# Patient Record
Sex: Male | Born: 1950 | Race: White | Hispanic: No | Marital: Married | Smoking: Never smoker
Health system: Southern US, Community
[De-identification: ages and names within clinical notes are randomized; demographics above are authoritative.]

## PROBLEM LIST (undated history)

## (undated) DIAGNOSIS — I10 Essential (primary) hypertension: Secondary | ICD-10-CM

## (undated) DIAGNOSIS — M545 Low back pain, unspecified: Secondary | ICD-10-CM

## (undated) DIAGNOSIS — G4700 Insomnia, unspecified: Secondary | ICD-10-CM

## (undated) DIAGNOSIS — K5792 Diverticulitis of intestine, part unspecified, without perforation or abscess without bleeding: Secondary | ICD-10-CM

## (undated) DIAGNOSIS — G8929 Other chronic pain: Secondary | ICD-10-CM

## (undated) DIAGNOSIS — C4402 Squamous cell carcinoma of skin of lip: Secondary | ICD-10-CM

## (undated) DIAGNOSIS — L259 Unspecified contact dermatitis, unspecified cause: Secondary | ICD-10-CM

## (undated) DIAGNOSIS — M6528 Calcific tendinitis, other site: Secondary | ICD-10-CM

## (undated) DIAGNOSIS — Z86010 Personal history of colonic polyps: Secondary | ICD-10-CM

## (undated) DIAGNOSIS — F5104 Psychophysiologic insomnia: Secondary | ICD-10-CM

## (undated) DIAGNOSIS — F419 Anxiety disorder, unspecified: Secondary | ICD-10-CM

## (undated) DIAGNOSIS — K219 Gastro-esophageal reflux disease without esophagitis: Secondary | ICD-10-CM

## (undated) DIAGNOSIS — K279 Peptic ulcer, site unspecified, unspecified as acute or chronic, without hemorrhage or perforation: Secondary | ICD-10-CM

## (undated) DIAGNOSIS — E782 Mixed hyperlipidemia: Secondary | ICD-10-CM

## (undated) DIAGNOSIS — H8110 Benign paroxysmal vertigo, unspecified ear: Secondary | ICD-10-CM

## (undated) DIAGNOSIS — K130 Diseases of lips: Secondary | ICD-10-CM

## (undated) DIAGNOSIS — Z8709 Personal history of other diseases of the respiratory system: Secondary | ICD-10-CM

## (undated) DIAGNOSIS — M7662 Achilles tendinitis, left leg: Secondary | ICD-10-CM

## (undated) DIAGNOSIS — A6000 Herpesviral infection of urogenital system, unspecified: Secondary | ICD-10-CM

## (undated) DIAGNOSIS — Z8619 Personal history of other infectious and parasitic diseases: Secondary | ICD-10-CM

## (undated) DIAGNOSIS — Z1211 Encounter for screening for malignant neoplasm of colon: Secondary | ICD-10-CM

## (undated) HISTORY — DX: Diverticulitis of intestine, part unspecified, without perforation or abscess without bleeding: K57.92

## (undated) HISTORY — DX: Unspecified contact dermatitis, unspecified cause: L25.9

## (undated) HISTORY — DX: Personal history of other infectious and parasitic diseases: Z86.19

## (undated) HISTORY — PX: EYE SURGERY: SHX253

## (undated) HISTORY — DX: Benign paroxysmal vertigo, unspecified ear: H81.10

## (undated) HISTORY — DX: Calcific tendinitis, other site: M65.28

## (undated) HISTORY — DX: Low back pain: M54.5

## (undated) HISTORY — DX: Personal history of other diseases of the respiratory system: Z87.09

## (undated) HISTORY — DX: Gastro-esophageal reflux disease without esophagitis: K21.9

## (undated) HISTORY — DX: Low back pain, unspecified: M54.50

## (undated) HISTORY — DX: Squamous cell carcinoma of skin of lip: C44.02

## (undated) HISTORY — DX: Essential (primary) hypertension: I10

## (undated) HISTORY — DX: Encounter for screening for malignant neoplasm of colon: Z12.11

## (undated) HISTORY — DX: Gilbert syndrome: E80.4

## (undated) HISTORY — DX: Personal history of colonic polyps: Z86.010

## (undated) HISTORY — DX: Other chronic pain: G89.29

## (undated) HISTORY — DX: Herpesviral infection of urogenital system, unspecified: A60.00

## (undated) HISTORY — DX: Peptic ulcer, site unspecified, unspecified as acute or chronic, without hemorrhage or perforation: K27.9

## (undated) HISTORY — DX: Psychophysiologic insomnia: F51.04

## (undated) HISTORY — DX: Diseases of lips: K13.0

## (undated) HISTORY — DX: Anxiety disorder, unspecified: F41.9

## (undated) HISTORY — DX: Insomnia, unspecified: G47.00

## (undated) HISTORY — DX: Mixed hyperlipidemia: E78.2

## (undated) HISTORY — DX: Achilles tendinitis, left leg: M76.62

---

## 2003-03-25 ENCOUNTER — Emergency Department (HOSPITAL_COMMUNITY): Admission: EM | Admit: 2003-03-25 | Discharge: 2003-03-25 | Payer: Self-pay | Admitting: Emergency Medicine

## 2003-07-18 DIAGNOSIS — Z86010 Personal history of colonic polyps: Secondary | ICD-10-CM

## 2003-07-18 DIAGNOSIS — Z860101 Personal history of adenomatous and serrated colon polyps: Secondary | ICD-10-CM

## 2003-07-18 HISTORY — DX: Personal history of colonic polyps: Z86.010

## 2003-07-18 HISTORY — PX: COLONOSCOPY W/ POLYPECTOMY: SHX1380

## 2003-07-18 HISTORY — DX: Personal history of adenomatous and serrated colon polyps: Z86.0101

## 2005-01-03 ENCOUNTER — Ambulatory Visit: Payer: Self-pay | Admitting: Family Medicine

## 2005-01-09 ENCOUNTER — Ambulatory Visit: Payer: Self-pay | Admitting: Family Medicine

## 2005-01-19 ENCOUNTER — Ambulatory Visit: Payer: Self-pay | Admitting: Family Medicine

## 2005-11-21 ENCOUNTER — Encounter: Payer: Self-pay | Admitting: Family Medicine

## 2006-02-13 ENCOUNTER — Ambulatory Visit: Payer: Self-pay | Admitting: Family Medicine

## 2006-03-08 ENCOUNTER — Ambulatory Visit: Payer: Self-pay | Admitting: Family Medicine

## 2007-02-18 ENCOUNTER — Ambulatory Visit: Payer: Self-pay | Admitting: Family Medicine

## 2007-02-18 LAB — CONVERTED CEMR LAB
ALT: 23 units/L (ref 0–53)
AST: 21 units/L (ref 0–37)
Albumin: 4.1 g/dL (ref 3.5–5.2)
Alkaline Phosphatase: 63 units/L (ref 39–117)
BUN: 13 mg/dL (ref 6–23)
Basophils Absolute: 0 10*3/uL (ref 0.0–0.1)
Basophils Relative: 0.5 % (ref 0.0–1.0)
Bilirubin Urine: NEGATIVE
Bilirubin, Direct: 0.1 mg/dL (ref 0.0–0.3)
Blood in Urine, dipstick: NEGATIVE
CO2: 29 meq/L (ref 19–32)
Calcium: 8.8 mg/dL (ref 8.4–10.5)
Chloride: 108 meq/L (ref 96–112)
Cholesterol: 213 mg/dL (ref 0–200)
Creatinine, Ser: 1 mg/dL (ref 0.4–1.5)
Direct LDL: 132.1 mg/dL
Eosinophils Absolute: 0.6 10*3/uL (ref 0.0–0.6)
Eosinophils Relative: 9 % — ABNORMAL HIGH (ref 0.0–5.0)
GFR calc Af Amer: 101 mL/min
GFR calc non Af Amer: 83 mL/min
Glucose, Bld: 96 mg/dL (ref 70–99)
Glucose, Urine, Semiquant: NEGATIVE
HCT: 44.2 % (ref 39.0–52.0)
HDL: 48.9 mg/dL (ref >39.0)
Hemoglobin: 15.6 g/dL (ref 13.0–17.0)
Lymphocytes Relative: 22.6 % (ref 12.0–46.0)
MCHC: 35.2 g/dL (ref 30.0–36.0)
MCV: 98.3 fL (ref 78.0–100.0)
Monocytes Absolute: 0.5 10*3/uL (ref 0.2–0.7)
Monocytes Relative: 7.4 % (ref 3.0–11.0)
Neutro Abs: 3.9 10*3/uL (ref 1.4–7.7)
Neutrophils Relative %: 60.5 % (ref 43.0–77.0)
Nitrite: NEGATIVE
PSA: 0.34 ng/mL (ref 0.10–4.00)
Platelets: 228 10*3/uL (ref 150–400)
Potassium: 4.1 meq/L (ref 3.5–5.1)
Protein, U semiquant: NEGATIVE
RBC: 4.5 M/uL (ref 4.22–5.81)
RDW: 11.5 % (ref 11.5–14.6)
Sodium: 144 meq/L (ref 135–145)
Specific Gravity, Urine: 1.025
TSH: 2.67 microintl units/mL (ref 0.35–5.50)
Total Bilirubin: 1.4 mg/dL — ABNORMAL HIGH (ref 0.3–1.2)
Total CHOL/HDL Ratio: 4.4
Total Protein: 6.4 g/dL (ref 6.0–8.3)
Triglycerides: 180 mg/dL — ABNORMAL HIGH (ref 0–149)
Urobilinogen, UA: 0.2
VLDL: 36 mg/dL (ref 0–40)
WBC Urine, dipstick: NEGATIVE
WBC: 6.4 10*3/uL (ref 4.5–10.5)
pH: 6

## 2007-02-21 ENCOUNTER — Ambulatory Visit: Payer: Self-pay | Admitting: Family Medicine

## 2007-02-21 DIAGNOSIS — L259 Unspecified contact dermatitis, unspecified cause: Secondary | ICD-10-CM | POA: Insufficient documentation

## 2007-03-19 ENCOUNTER — Ambulatory Visit: Payer: Self-pay | Admitting: Family Medicine

## 2007-03-19 DIAGNOSIS — N390 Urinary tract infection, site not specified: Secondary | ICD-10-CM | POA: Insufficient documentation

## 2007-03-19 DIAGNOSIS — K5732 Diverticulitis of large intestine without perforation or abscess without bleeding: Secondary | ICD-10-CM | POA: Insufficient documentation

## 2007-03-19 LAB — CONVERTED CEMR LAB
Bilirubin Urine: NEGATIVE
Glucose, Urine, Semiquant: NEGATIVE
Nitrite: NEGATIVE
Protein, U semiquant: NEGATIVE
Specific Gravity, Urine: 1.02
Urobilinogen, UA: 0.2
WBC Urine, dipstick: NEGATIVE
pH: 6

## 2007-03-20 ENCOUNTER — Telehealth: Payer: Self-pay | Admitting: Family Medicine

## 2007-03-20 ENCOUNTER — Ambulatory Visit: Payer: Self-pay | Admitting: Family Medicine

## 2007-03-20 DIAGNOSIS — M109 Gout, unspecified: Secondary | ICD-10-CM | POA: Insufficient documentation

## 2007-03-20 LAB — CONVERTED CEMR LAB
Basophils Absolute: 0 10*3/uL (ref 0.0–0.1)
Basophils Relative: 0.3 % (ref 0.0–1.0)
Eosinophils Absolute: 0.4 10*3/uL (ref 0.0–0.6)
Eosinophils Relative: 3.1 % (ref 0.0–5.0)
HCT: 40.2 % (ref 39.0–52.0)
Hemoglobin: 14 g/dL (ref 13.0–17.0)
Lymphocytes Relative: 12.7 % (ref 12.0–46.0)
MCHC: 35.5 g/dL (ref 30.0–36.0)
MCV: 96.1 fL (ref 78.0–100.0)
Monocytes Absolute: 1.9 10*3/uL — ABNORMAL HIGH (ref 0.2–0.7)
Monocytes Relative: 15.2 % — ABNORMAL HIGH (ref 3.0–11.0)
Neutro Abs: 8.5 10*3/uL — ABNORMAL HIGH (ref 1.4–7.7)
Neutrophils Relative %: 68.7 % (ref 43.0–77.0)
Platelets: 248 10*3/uL (ref 150–400)
RBC: 4.18 M/uL — ABNORMAL LOW (ref 4.22–5.81)
RDW: 11.3 % — ABNORMAL LOW (ref 11.5–14.6)
WBC: 12.4 10*3/uL — ABNORMAL HIGH (ref 4.5–10.5)

## 2007-04-11 ENCOUNTER — Telehealth: Payer: Self-pay | Admitting: Family Medicine

## 2007-06-10 ENCOUNTER — Encounter: Payer: Self-pay | Admitting: Family Medicine

## 2007-11-13 ENCOUNTER — Telehealth: Payer: Self-pay | Admitting: Family Medicine

## 2008-02-20 ENCOUNTER — Ambulatory Visit: Payer: Self-pay | Admitting: Family Medicine

## 2008-02-20 DIAGNOSIS — F528 Other sexual dysfunction not due to a substance or known physiological condition: Secondary | ICD-10-CM | POA: Insufficient documentation

## 2008-02-20 DIAGNOSIS — G4700 Insomnia, unspecified: Secondary | ICD-10-CM | POA: Insufficient documentation

## 2008-02-20 DIAGNOSIS — K219 Gastro-esophageal reflux disease without esophagitis: Secondary | ICD-10-CM | POA: Insufficient documentation

## 2008-02-20 DIAGNOSIS — S335XXA Sprain of ligaments of lumbar spine, initial encounter: Secondary | ICD-10-CM

## 2008-02-20 DIAGNOSIS — S339XXA Sprain of unspecified parts of lumbar spine and pelvis, initial encounter: Secondary | ICD-10-CM | POA: Insufficient documentation

## 2008-02-21 LAB — CONVERTED CEMR LAB
Basophils Absolute: 0 10*3/uL (ref 0.0–0.1)
Basophils Relative: 0.7 % (ref 0.0–3.0)
Cholesterol: 226 mg/dL (ref 0–200)
Direct LDL: 169.8 mg/dL
Eosinophils Absolute: 0.5 10*3/uL (ref 0.0–0.7)
Eosinophils Relative: 8.7 % — ABNORMAL HIGH (ref 0.0–5.0)
HCT: 45.9 % (ref 39.0–52.0)
HDL: 43.3 mg/dL (ref >39.0)
Hemoglobin: 16 g/dL (ref 13.0–17.0)
Lymphocytes Relative: 23.2 % (ref 12.0–46.0)
MCHC: 34.7 g/dL (ref 30.0–36.0)
MCV: 96.4 fL (ref 78.0–100.0)
Monocytes Absolute: 0.6 10*3/uL (ref 0.1–1.0)
Monocytes Relative: 9.5 % (ref 3.0–12.0)
Neutro Abs: 3.7 10*3/uL (ref 1.4–7.7)
Neutrophils Relative %: 57.9 % (ref 43.0–77.0)
PSA: 0.34 ng/mL (ref 0.10–4.00)
Platelets: 239 10*3/uL (ref 150–400)
RBC: 4.76 M/uL (ref 4.22–5.81)
RDW: 12.1 % (ref 11.5–14.6)
Total CHOL/HDL Ratio: 5.2
Triglycerides: 113 mg/dL (ref 0–149)
VLDL: 23 mg/dL (ref 0–40)
WBC: 6.2 10*3/uL (ref 4.5–10.5)

## 2008-04-29 ENCOUNTER — Telehealth: Payer: Self-pay | Admitting: Family Medicine

## 2008-12-30 ENCOUNTER — Ambulatory Visit: Payer: Self-pay | Admitting: Family Medicine

## 2008-12-30 DIAGNOSIS — L0591 Pilonidal cyst without abscess: Secondary | ICD-10-CM | POA: Insufficient documentation

## 2008-12-30 DIAGNOSIS — J309 Allergic rhinitis, unspecified: Secondary | ICD-10-CM | POA: Insufficient documentation

## 2008-12-30 DIAGNOSIS — M549 Dorsalgia, unspecified: Secondary | ICD-10-CM | POA: Insufficient documentation

## 2009-02-04 ENCOUNTER — Telehealth: Payer: Self-pay | Admitting: Family Medicine

## 2009-03-03 ENCOUNTER — Ambulatory Visit: Payer: Self-pay | Admitting: Family Medicine

## 2009-03-03 LAB — CONVERTED CEMR LAB
Bilirubin Urine: NEGATIVE
Blood in Urine, dipstick: NEGATIVE
Glucose, Urine, Semiquant: NEGATIVE
Ketones, urine, test strip: NEGATIVE
Nitrite: NEGATIVE
Protein, U semiquant: NEGATIVE
Specific Gravity, Urine: 1.02
Urobilinogen, UA: 0.2
WBC Urine, dipstick: NEGATIVE
pH: 6

## 2009-03-10 ENCOUNTER — Ambulatory Visit: Payer: Self-pay | Admitting: Family Medicine

## 2009-03-10 DIAGNOSIS — M5126 Other intervertebral disc displacement, lumbar region: Secondary | ICD-10-CM | POA: Insufficient documentation

## 2009-03-10 DIAGNOSIS — B370 Candidal stomatitis: Secondary | ICD-10-CM | POA: Insufficient documentation

## 2009-03-11 ENCOUNTER — Telehealth: Payer: Self-pay | Admitting: Family Medicine

## 2009-03-16 LAB — CONVERTED CEMR LAB
ALT: 19 units/L (ref 0–53)
AST: 25 units/L (ref 0–37)
Albumin: 4.4 g/dL (ref 3.5–5.2)
Alkaline Phosphatase: 72 units/L (ref 39–117)
BUN: 15 mg/dL (ref 6–23)
Basophils Absolute: 0 10*3/uL (ref 0.0–0.1)
Basophils Relative: 0 % (ref 0.0–3.0)
Bilirubin, Direct: 0.2 mg/dL (ref 0.0–0.3)
CO2: 27 meq/L (ref 19–32)
Calcium: 9.2 mg/dL (ref 8.4–10.5)
Chloride: 109 meq/L (ref 96–112)
Cholesterol: 232 mg/dL — ABNORMAL HIGH (ref 0–200)
Creatinine, Ser: 1 mg/dL (ref 0.4–1.5)
Direct LDL: 146.4 mg/dL
Eosinophils Absolute: 0.3 10*3/uL (ref 0.0–0.7)
Eosinophils Relative: 4.4 % (ref 0.0–5.0)
GFR calc non Af Amer: 82.16 mL/min (ref >60)
Glucose, Bld: 96 mg/dL (ref 70–99)
HCT: 43.8 % (ref 39.0–52.0)
HDL: 44.5 mg/dL (ref >39.00)
Hemoglobin: 15.5 g/dL (ref 13.0–17.0)
Lymphocytes Relative: 18.7 % (ref 12.0–46.0)
Lymphs Abs: 1.3 10*3/uL (ref 0.7–4.0)
MCHC: 35.4 g/dL (ref 30.0–36.0)
MCV: 98.1 fL (ref 78.0–100.0)
Monocytes Absolute: 0.6 10*3/uL (ref 0.1–1.0)
Monocytes Relative: 9.2 % (ref 3.0–12.0)
Neutro Abs: 4.5 10*3/uL (ref 1.4–7.7)
Neutrophils Relative %: 67.7 % (ref 43.0–77.0)
PSA: 0.33 ng/mL (ref 0.10–4.00)
Platelets: 212 10*3/uL (ref 150.0–400.0)
Potassium: 4.2 meq/L (ref 3.5–5.1)
RBC: 4.46 M/uL (ref 4.22–5.81)
RDW: 11.9 % (ref 11.5–14.6)
Sodium: 143 meq/L (ref 135–145)
TSH: 3.59 microintl units/mL (ref 0.35–5.50)
Total Bilirubin: 1.7 mg/dL — ABNORMAL HIGH (ref 0.3–1.2)
Total CHOL/HDL Ratio: 5
Total Protein: 6.9 g/dL (ref 6.0–8.3)
Triglycerides: 298 mg/dL — ABNORMAL HIGH (ref 0.0–149.0)
VLDL: 59.6 mg/dL — ABNORMAL HIGH (ref 0.0–40.0)
WBC: 6.7 10*3/uL (ref 4.5–10.5)

## 2009-04-23 ENCOUNTER — Telehealth: Payer: Self-pay | Admitting: Family Medicine

## 2009-04-26 ENCOUNTER — Telehealth: Payer: Self-pay | Admitting: Family Medicine

## 2010-02-02 ENCOUNTER — Telehealth: Payer: Self-pay | Admitting: Family Medicine

## 2010-02-23 ENCOUNTER — Ambulatory Visit: Payer: Self-pay | Admitting: Family Medicine

## 2010-04-07 ENCOUNTER — Telehealth (INDEPENDENT_AMBULATORY_CARE_PROVIDER_SITE_OTHER): Payer: Self-pay | Admitting: *Deleted

## 2010-08-02 ENCOUNTER — Telehealth: Payer: Self-pay | Admitting: Family Medicine

## 2010-08-08 ENCOUNTER — Encounter: Payer: Self-pay | Admitting: Family Medicine

## 2010-08-16 NOTE — Progress Notes (Signed)
Summary: needs results  Phone Note Call from Patient Call back at Home Phone [phone]   Caller: Patient Call For: dr Scotty Court Summary of Call: pt is returning gina call for results Initial call taken by: Heron Sabins,  March 20, 2007 11:06 AM  Follow-up for Phone Call        called by Dr Scotty Court   Follow-up by: Pura Spice, RN,  March 20, 2007 1:52 PM

## 2010-08-16 NOTE — Assessment & Plan Note (Signed)
Summary: raw spot on lip/LC   Vital Signs:  Patient profile:   60 year old male Temp:     98.4 degrees F oral BP sitting:   120 / 84  (left arm) Cuff size:   regular  Vitals Entered By: Sid Falcon LPN (March 03, 2009 9:28 AM)  History of Present Illness: Patient was here getting lab work for physical next week and was asked to be seen as a work in with one-week history of irritation confined to the lower lip. Particularly of the past couple days increased soreness and whitish plaque covering the lip. He was on antibiotics in June but not since then. No recent prednisone use. No immune problems. No history of diabetes. No steroid inhaler use.  denies sore throat no difficulty swallowing.  No recent change of medications.  Allergies (verified): No Known Drug Allergies  Review of Systems  The patient denies anorexia and fever.    Physical Exam  General:  Well-developed,well-nourished,in no acute distress; alert,appropriate and cooperative throughout examination Eyes:  pupils round.   Mouth:  patient has erythema involving the lower lip and some whitish plaque which is covering some areas. This is fairly tenacious but does scrape off with pressure. Gingival mucosa appears normal. Buccal mucosa and posterior pharynx are normal in appearance. Upper lip is normal to exam. No evidence for a cold sores involving lower lip. Neck:  no adenopathy noted   Impression & Recommendations:  Problem # 1:  SKIN RASH (ICD-782.1) This really looks clinically more like thrush though he has no clear risk factors. Will treat with antifungal topically and he has scheduled followup with primary physician next week and labs were drawn today. No diabetes symptoms. His updated medication list for this problem includes:    Triamcinolone Acetonide 0.1 % Crea (Triamcinolone acetonide) .Marland Kitchen... As needed  Complete Medication List: 1)  Alprazolam 0.5 Mg Tabs (Alprazolam) .Marland Kitchen.. 1 tablet morn midafternoon and 2 has  for stress 2)  Protonix 40 Mg Pack (Pantoprazole sodium) .... Take 1 tablet by mouth once a day 3)  Triamcinolone Acetonide 0.1 % Crea (Triamcinolone acetonide) .... As needed 4)  Ibuprofen 800 Mg Tabs (Ibuprofen) .... Three times a day 5)  Adult Aspirin Low Strength 81 Mg Tbdp (Aspirin) .... Once daily 6)  Temazepam 30 Mg Caps (Temazepam) .Marland Kitchen.. 1 hs as needed sleep 7)  Skelaxin 800 Mg Tabs (Metaxalone) .Marland Kitchen.. 1 qid for spasm 8)  Doxycycline Monohydrate 100 Mg Caps (Doxycycline monohydrate) .Marland Kitchen.. 1 two times a day for infection 9)  Omnaris 50 Mcg/act Susp (Ciclesonide) .... 2 sprays each nostril 10)  Nystatin 100000 Unit/ml Susp (Nystatin) .... 5 ml to each side of mouth qid as instructed 11)  Indomethacin 50 Mg Caps (Indomethacin) .Marland Kitchen.. 1 three times a day pc for gout 12)  Diflucan 200 Mg Tabs (Fluconazole) .Marland Kitchen.. 1 once daily until finished for yeast  Other Orders: UA Dipstick w/o Micro (automated)  (81003) Venipuncture (25366) TLB-Lipid Panel (80061-LIPID) TLB-BMP (Basic Metabolic Panel-BMET) (80048-METABOL) TLB-CBC Platelet - w/Differential (85025-CBCD) TLB-Hepatic/Liver Function Pnl (80076-HEPATIC) TLB-TSH (Thyroid Stimulating Hormone) (84443-TSH) TLB-PSA (Prostate Specific Antigen) (84153-PSA)  Patient Instructions: 1)  Followup promptly if you have any difficulty swallowing such as severe sore throat or worsening rash over the next couple days. Prescriptions: SKELAXIN 800 MG  TABS (METAXALONE) 1 qid for spasm  #120 x 11   Entered by:   Judithann Sheen MD   Authorized by:   Evelena Peat MD   Signed by:   Ivar Drape  Danice Goltz MD on 03/11/2009   Method used:   Electronically to        [facility] [phone]* (retail)       [address]       [place], [place]  [address]       Ph: [phone]       Fax: [phone]   RxID:   [id number] IBUPROFEN 800 MG  TABS (IBUPROFEN) three times a day  #90 x 11   Entered by:   Judithann Sheen MD   Authorized by:   Evelena Peat MD   Signed by:   Judithann Sheen MD on 03/11/2009   Method used:   Electronically to        [facility] [phone]* (retail)       [address]       [place], [place]  [address]       Ph: [phone]       Fax: [phone]   RxID:   [id number] PROTONIX 40 MG  PACK (PANTOPRAZOLE SODIUM) Take 1 tablet by mouth once a day  #30 x 11   Entered by:   Judithann Sheen MD   Authorized by:   Evelena Peat MD   Signed by:   Judithann Sheen MD on 03/11/2009   Method used:   Electronically to        [facility] [phone]* (retail)       [address]       [place], [place]  [address]       Ph: [phone]       Fax: [phone]   RxID:   [id number] DIFLUCAN 200 MG TABS (FLUCONAZOLE) 1 once daily until finished for yeast  #14 x 1   Entered by:   Judithann Sheen MD   Authorized by:   Evelena Peat MD   Signed by:   Judithann Sheen MD on 03/11/2009   Method used:   Electronically to        [facility] [phone]* (retail)       [address]       [place], [place]  [address]       Ph: [phone]       Fax: [phone]   RxID:   [id number] INDOMETHACIN 50 MG CAPS (INDOMETHACIN) 1 three times a day pc for gout  #90 x 11   Entered by:   Judithann Sheen MD   Authorized by:   Evelena Peat MD   Signed by:   Judithann Sheen MD on 03/11/2009   Method used:   Electronically to        [facility] [phone]* (retail)       [address]       [place], [place]  [address]       Ph: [phone]       Fax: [phone]   RxID:   [id number] NYSTATIN 100000 UNIT/ML SUSP (NYSTATIN) 5 ml to each side of mouth qid as instructed  #16 0z x 1   Entered and Authorized by:   Evelena Peat MD   Signed by:   Evelena Peat MD on 03/03/2009   Method used:   Electronically to        [facility] [phone]* (retail)       [address]       [place],  [place]  [address]       Ph: [phone]       Fax: [phone]   RxID:   [id number]    Laboratory Results   Urine Tests    Routine Urinalysis   Color: yellow Appearance: Clear Glucose: negative   (Normal Range: Negative) Bilirubin: negative   (Normal Range: Negative) Ketone: negative   (Normal Range: Negative) Spec. Gravity: 1.020   (Normal Range: 1.003-1.035) Blood: negative   (Normal Range: Negative) pH: 6.0   (Normal Range: 5.0-8.0) Protein: negative   (Normal Range: Negative) Urobilinogen: 0.2   (Normal Range: 0-1) Nitrite: negative   (Normal Range: Negative) Leukocyte Esterace: negative   (Normal Range: Negative)    Comments: Joanne Chars CMA  March 04, 2009 11:20 AM

## 2010-08-16 NOTE — Assessment & Plan Note (Signed)
Summary: FU ON MEDS/PT WILL COME IN FASTING/NJR   Vital Signs:  Patient Profile:   60 Years Old Male Weight:      178 pounds O2 Sat:      97 % Temp:     98.1 degrees F Pulse rate:   82 / minute BP sitting:   130 / 92  (left arm)  Vitals Entered By: Pura Spice, RN (February 20, 2008 8:48 AM)                 Chief Complaint:  rov med refill . Last CPX 02/2007. Taking Prednisone 40 mg decreasing due to back .  History of Present Illness: t in for evaluation of problems Recently had acute lumbosacral strain with marked muscle spasm Continues to be under considerable stress due to economy and family Insomnia, to try tremazepam instead of ambien other wise feels good    Current Allergies (reviewed today): No known allergies      Review of Systems      See HPI  General      Denies chills, fatigue, fever, loss of appetite, malaise, sleep disorder, sweats, weakness, and weight loss.  ENT      Denies decreased hearing, difficulty swallowing, ear discharge, earache, hoarseness, nasal congestion, nosebleeds, postnasal drainage, ringing in ears, sinus pressure, and sore throat.  CV      Denies bluish discoloration of lips or nails, chest pain or discomfort, difficulty breathing at night, difficulty breathing while lying down, fainting, fatigue, leg cramps with exertion, lightheadness, near fainting, palpitations, shortness of breath with exertion, swelling of feet, swelling of hands, and weight gain.  Resp      Denies chest discomfort, chest pain with inspiration, cough, coughing up blood, excessive snoring, hypersomnolence, morning headaches, pleuritic, shortness of breath, sputum productive, and wheezing.  GI      Denies abdominal pain, bloody stools, change in bowel habits, constipation, dark tarry stools, diarrhea, excessive appetite, gas, hemorrhoids, indigestion, loss of appetite, nausea, vomiting, vomiting blood, and yellowish skin color.      no recent episode of  diverticulitis  GU      Complains of erectile dysfunction.      viagra  MS      See HPI      gerd controlled with protonix   Physical Exam  General:     Well-developed,well-nourished,in no acute distress; alert,appropriate and cooperative throughout examination Head:     Normocephalic and atraumatic without obvious abnormalities. No apparent alopecia or balding. Eyes:     No corneal or conjunctival inflammation noted. EOMI. Perrla. Funduscopic exam benign, without hemorrhages, exudates or papilledema. Vision grossly normal. Ears:     External ear exam shows no significant lesions or deformities.  Otoscopic examination reveals clear canals, tympanic membranes are intact bilaterally without bulging, retraction, inflammation or discharge. Hearing is grossly normal bilaterally. Nose:     External nasal examination shows no deformity or inflammation. Nasal mucosa are pink and moist without lesions or exudates. Mouth:     Oral mucosa and oropharynx without lesions or exudates.  Teeth in good repair. Neck:     No deformities, masses, or tenderness noted. Chest Wall:     No deformities, masses, tenderness or gynecomastia noted. Breasts:     No masses or gynecomastia noted Lungs:     Normal respiratory effort, chest expands symmetrically. Lungs are clear to auscultation, no crackles or wheezes. Heart:     Normal rate and regular rhythm. S1 and S2 normal without  gallop, murmur, click, rub or other extra sounds. Abdomen:     Bowel sounds positive,abdomen soft and non-tender without masses, organomegaly or hernias noted. Rectal:     No external abnormalities noted. Normal sphincter tone. No rectal masses or tenderness. Genitalia:     Testes bilaterally descended without nodularity, tenderness or masses. No scrotal masses or lesions. No penis lesions or urethral discharge. Prostate:     Prostate gland firm and smooth, no enlargement, nodularity, tenderness, mass, asymmetry or induration.  Msk:     marked muscle spasm lumbar, no limitation straight leg raising Pulses:     R and L carotid,radial,femoral,dorsalis pedis and posterior tibial pulses are full and equal bilaterally Extremities:     No clubbing, cyanosis, edema, or deformity noted with normal full range of motion of all joints.   Neurologic:     No cranial nerve deficits noted. Station and gait are normal. Plantar reflexes are down-going bilaterally. DTRs are symmetrical throughout. Sensory, motor and coordinative functions appear intact. Skin:     Intact without suspicious lesions or rashes Cervical Nodes:     No lymphadenopathy noted Axillary Nodes:     No palpable lymphadenopathy Inguinal Nodes:     No significant adenopathy Psych:     Cognition and judgment appear intact. Alert and cooperative with normal attention span and concentration. No apparent delusions, illusions, hallucinations    Impression & Recommendations:  Problem # 1:  LUMBOSACRAL STRAIN (ICD-846.0) Assessment: New  Problem # 2:  GOUT NOS (ICD-274.9) Assessment: Improved  His updated medication list for this problem includes:    Ibuprofen 800 Mg Tabs (Ibuprofen) .Marland Kitchen... Three times a day   Problem # 3:  DIVERTICULITIS, COLON W/O HEM (ICD-562.11) Assessment: Improved  Problem # 4:  HEALTH MAINTENANCE EXAM (ICD-V70.0) Assessment: Unchanged  Orders: Venipuncture (16109) TLB-CBC Platelet - w/Differential (85025-CBCD) TLB-Lipid Panel (80061-LIPID) TLB-PSA (Prostate Specific Antigen) (84153-PSA)   Problem # 5:  GERD (ICD-530.81) Assessment: Improved  His updated medication list for this problem includes:    Protonix 40 Mg Pack (Pantoprazole sodium) .Marland Kitchen... Take 1 tablet by mouth once a day   Complete Medication List: 1)  Alprazolam 0.5 Mg Tabs (Alprazolam) .Marland Kitchen.. 1 tablet morn midafternoon and 2 has for stress 2)  Protonix 40 Mg Pack (Pantoprazole sodium) .... Take 1 tablet by mouth once a day 3)  Triamcinolone Acetonide 0.1 %  Crea (Triamcinolone acetonide) .... As needed 4)  Ibuprofen 800 Mg Tabs (Ibuprofen) .... Three times a day 5)  Adult Aspirin Low Strength 81 Mg Tbdp (Aspirin) .... Once daily 6)  Metronidazole 500 Mg Tabs (Metronidazole) .... Three times a day 7)  Ciprofloxacin Hcl 500 Mg Tabs (Ciprofloxacin hcl) .... Take one tablet twice a day 8)  Cyclobenzaprine Hcl 10 Mg Tabs (Cyclobenzaprine hcl) .... Three times a day 9)  Temazepam 30 Mg Caps (Temazepam) .Marland Kitchen.. 1 hs as needed sleep 10)  Skelaxin 800 Mg Tabs (Metaxalone) .Marland Kitchen.. 1 qid for spasm 11)  Viagra 100 Mg Tabs (Sildenafil citrate)   Patient Instructions: 1)  to increase skelaxin to 1 4 ntimes per day 2)  try temazepam instead of ambien 3)  continue other medications   Prescriptions: TEMAZEPAM 30 MG  CAPS (TEMAZEPAM) 1 hs as needed sleep  #30 x 5   Entered and Authorized by:   Judithann Sheen MD   Signed by:   Judithann Sheen MD on 02/20/2008   Method used:   Electronically sent to ...       [facility]  [id number] [phone]*       [address]       [place], [place]         Ph: [phone]       Fax: [phone]   RxID:   [id number] CYCLOBENZAPRINE HCL 10 MG  TABS (CYCLOBENZAPRINE HCL) three times a day  #90 x 11   Entered and Authorized by:   Judithann Sheen MD   Signed by:   Judithann Sheen MD on 02/20/2008   Method used:   Electronically sent to ...       [facility]  [address] [phone]*       [address]       [place], [place]         Ph: [phone]       Fax: [phone]   RxID:   [id number] IBUPROFEN 800 MG  TABS (IBUPROFEN) three times a day  #90 x 11   Entered and Authorized by:   Judithann Sheen MD   Signed by:   Judithann Sheen MD on 02/20/2008   Method used:   Electronically sent to ...       [facility]  [address] [phone]*       [address]       [place], [place]         Ph: [phone]       Fax: [phone]   RxID:   [id number] PROTONIX 40 MG  PACK (PANTOPRAZOLE SODIUM)  Take 1 tablet by mouth once a day  #90 x 11   Entered and Authorized by:   Judithann Sheen MD   Signed by:   Judithann Sheen MD on 02/20/2008   Method used:   Electronically sent to ...       [facility]  [address] [phone]*       [address]       [place], [place]         Ph: [phone]       Fax: [phone]   RxID:   [id number] ALPRAZOLAM 0.5 MG TABS (ALPRAZOLAM) 1 tablet morn midafternoon and 2 has for stress  #120 x 5   Entered and Authorized by:   Judithann Sheen MD   Signed by:   Judithann Sheen MD on 02/20/2008   Method used:   Print then Give to Patient   RxID:   [id number]  ]

## 2010-08-16 NOTE — Progress Notes (Signed)
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  Follow-up by: Pura Spice, RN,  February 04, 2009 5:40 PM

## 2010-08-16 NOTE — Progress Notes (Signed)
Summary: Omnaris prior authorization  Phone Note Outgoing Call   Summary of Call: Left a message for pt to return my call. We are receiving a prior authorization for Omnaris? MD needs to know if patient has allergies? Or why is pt needing this med? Initial call taken by: Josph Macho RMA,  April 07, 2010 10:45 AM     Appended Document: Omnaris prior authorization Spoke with pt he states he tried Nasacort a few years ago and has no allergies.  Appended Document: Omnaris prior authorization    Clinical Lists Changes  Medications: Added new medication of FLUTICASONE PROPIONATE 50 MCG/ACT SUSP (FLUTICASONE PROPIONATE) 2 sprays each nostril daily as needed allergies - Signed Rx of FLUTICASONE PROPIONATE 50 MCG/ACT SUSP (FLUTICASONE PROPIONATE) 2 sprays each nostril daily as needed allergies;  #1 bottle x 1;  Signed;  Entered by: Danise Edge MD;  Authorized by: Danise Edge MD;  Method used: Electronically to [facility] [phone]*, [address] [place], [place]  [address], Ph: [phone], Fax: [phone]    Prescriptions: FLUTICASONE PROPIONATE 50 MCG/ACT SUSP (FLUTICASONE PROPIONATE) 2 sprays each nostril daily as needed allergies  #1 bottle x 1   Entered and Authorized by:   Danise Edge MD   Signed by:   Danise Edge MD on 04/11/2010   Method used:   Electronically to        [facility] [phone]* (retail)       [address]       [place], [place]  [address]       Ph: [phone]       Fax: [phone]   RxID:   [id number]

## 2010-08-16 NOTE — Progress Notes (Signed)
Summary: fungus   Phone Note Call from Patient Call back at [phone]   Summary of Call: Fungus inside lip again.  Taken 14 day med diflucan, still red & blistery, burns bad.  Have another refill.  What to do? Initial call taken by: Rudy Jew, RN,  February 02, 2010 1:47 PM  Follow-up for Phone Call        called pt, torefill Follow-up by: Judithann Sheen MD,  February 07, 2010 6:47 PM

## 2010-08-16 NOTE — Assessment & Plan Note (Signed)
Summary: med check--//ccm   Vital Signs:  Patient profile:   60 year old male Weight:      176 pounds BMI:     24.64 O2 Sat:      95 % Temp:     98.2 degrees F Pulse rate:   89 / minute BP sitting:   120 / 80  (left arm)  Vitals Entered By: Pura Spice, RN (February 23, 2010 9:17 AM) CC: ck inside lip needs refills and wants name of neurologist.    History of Present Illness: History to 71-year-old white married male L1 has had a problem with milia or fungal infection a stomatitis and recent reoccurring on his lip and inner aspect of his lips he did respond to Diflucan but now has had a real current peer he is 90% better but not completely well Al is under good control withi Indocin when it occurs he has a history of chronic lumbosacral pain and has to be very careful as to his activity is as far as abnormal movement and lifting times he is totally inked past stated in the ibuprofen as well as Skelaxin Continuous to have a problem with chronic insomnia and needs temazepam GERD is under control with proton it but he needs to take it continuously Will refill his medication  Allergies (verified): No Known Drug Allergies  Past History:  Social History: Last updated: 02/20/2007 Never Smoked Married Alcohol use-yes Drug use-no Regular exercise-yes  Risk Factors: Smoking Status: never (02/20/2007)  Family History: Family History of Cardiovascular disorder Father had MI at age 81  Review of Systems      See HPI  The patient denies anorexia, fever, weight loss, weight gain, vision loss, decreased hearing, hoarseness, chest pain, syncope, dyspnea on exertion, peripheral edema, prolonged cough, headaches, hemoptysis, abdominal pain, melena, hematochezia, severe indigestion/heartburn, hematuria, incontinence, genital sores, muscle weakness, suspicious skin lesions, transient blindness, difficulty walking, depression, unusual weight change, abnormal bleeding, enlarged lymph nodes,  angioedema, breast masses, and testicular masses.    Physical Exam  General:  Well-developed,well-nourished,in no acute distress; alert,appropriate and cooperative throughout examination Head:  Normocephalic and atraumatic without obvious abnormalities. No apparent alopecia or balding. Eyes:  No corneal or conjunctival inflammation noted. EOMI. Perrla. Funduscopic exam benign, without hemorrhages, exudates or papilledema. Vision grossly normal. Ears:  External ear exam shows no significant lesions or deformities.  Otoscopic examination reveals clear canals, tympanic membranes are intact bilaterally without bulging, retraction, inflammation or discharge. Hearing is grossly normal bilaterally. Nose:  External nasal examination shows no deformity or inflammation. Nasal mucosa are pink and moist without lesions or exudates. Mouth:  monilial lesions of lip white sacaly Lungs:  Normal respiratory effort, chest expands symmetrically. Lungs are clear to auscultation, no crackles or wheezes. Heart:  Normal rate and regular rhythm. S1 and S2 normal without gallop, murmur, click, rub or other extra sounds. Abdomen:  Bowel sounds positive,abdomen soft and non-tender without masses, organomegaly or hernias noted. Msk:  No deformity or scoliosis noted of thoracic or lumbar spine.  no pain no movement or pressure at this time Pulses:  R and L carotid,radial,femoral,dorsalis pedis and posterior tibial pulses are full and equal bilaterally Extremities:  No clubbing, cyanosis, edema, or deformity noted with normal full range of motion of all joints.     Impression & Recommendations:  Problem # 1:  CANDIDIASIS OF MOUTH (ICD-112.0) Assessment Deteriorated Diflucan q.d.  Problem # 2:  BACK PAIN, LUMBOSACRAL, CHRONIC (ICD-724.5) Assessment: Unchanged  His updated medication list  for this problem includes:    Ibuprofen 800 Mg Tabs (Ibuprofen) .Marland Kitchen... Three times a day    Adult Aspirin Low Strength 81 Mg Tbdp  (Aspirin) ..... Once daily    Skelaxin 800 Mg Tabs (Metaxalone) .Marland Kitchen... 1 qid for spasm    Indomethacin 50 Mg Caps (Indomethacin) .Marland Kitchen... 1 three times a day pc for gout  Problem # 3:  INSOMNIA, CHRONIC (ICD-307.42) Assessment: Unchanged temazepam 30 mg h.s.  Problem # 4:  GOUT NOS (ICD-274.9) Assessment: Improved  His updated medication list for this problem includes:    Ibuprofen 800 Mg Tabs (Ibuprofen) .Marland Kitchen... Three times a day    Indomethacin 50 Mg Caps (Indomethacin) .Marland Kitchen... 1 three times a day pc for gout  Complete Medication List: 1)  Alprazolam 0.5 Mg Tabs (Alprazolam) .Marland Kitchen.. 1 tablet morn midafternoon and 2 has for stress 2)  Protonix 40 Mg Pack (Pantoprazole sodium) .... Take 1 tablet by mouth once a day 3)  Triamcinolone Acetonide 0.1 % Crea (Triamcinolone acetonide) .... As needed 4)  Ibuprofen 800 Mg Tabs (Ibuprofen) .... Three times a day 5)  Adult Aspirin Low Strength 81 Mg Tbdp (Aspirin) .... Once daily 6)  Temazepam 30 Mg Caps (Temazepam) .Marland Kitchen.. 1 hs as needed sleep needs ov prior to refills 7)  Skelaxin 800 Mg Tabs (Metaxalone) .Marland Kitchen.. 1 qid for spasm 8)  Omnaris 50 Mcg/act Susp (Ciclesonide) .... 2 sprays each nostril 9)  Nystatin 100000 Unit/ml Susp (Nystatin) .... 5 ml to each side of mouth qid as instructed 10)  Indomethacin 50 Mg Caps (Indomethacin) .Marland Kitchen.. 1 three times a day pc for gout 11)  Diflucan 200 Mg Tabs (Fluconazole) .Marland Kitchen.. 1 once daily until finished for yeast  Patient Instructions: 1)  continue medications as prescribed 2)  A Cardiolite or regarding a topical medication for your will 3)  Refill medication Prescriptions: PROTONIX 40 MG  PACK (PANTOPRAZOLE SODIUM) Take 1 tablet by mouth once a day  #30 x 11   Entered and Authorized by:   Judithann Sheen MD   Signed by:   Judithann Sheen MD on 03/05/2010   Method used:   Electronically to        [facility] [phone]* (retail)       [address]       [place], [place]  [address]       Ph: [phone]        Fax: [phone]   RxID:   [id number] PROTONIX 40 MG  PACK (PANTOPRAZOLE SODIUM) Take 1 tablet by mouth once a day  #30 x 11   Entered by:   Pura Spice, RN   Authorized by:   Judithann Sheen MD   Signed by:   Pura Spice, RN on 03/01/2010   Method used:   Electronically to        [facility] [phone]* (retail)       [address]       [place], [place]  [address]       Ph: [phone]       Fax: [phone]   RxID:   [id number] OMNARIS 50 MCG/ACT SUSP (CICLESONIDE) 2 sprays each nostril  #1 x 11   Entered and Authorized by:   Judithann Sheen MD   Signed by:   Judithann Sheen MD on 02/23/2010   Method used:   Electronically to        [facility] #  [id number]* (retail)       [address]       [place], [place]  [address]       Ph: [phone]       Fax: [phone]   RxID:   [id number] DIFLUCAN 200 MG TABS (FLUCONAZOLE) 1 once daily until finished for yeast  #30 x 11   Entered and Authorized by:   Judithann Sheen MD   Signed by:   Judithann Sheen MD on 02/23/2010   Method used:   Electronically to        [facility] [phone]* (retail)       [address]       [place], [place]  [address]       Ph: [phone]       Fax: [phone]   RxID:   [id number] INDOMETHACIN 50 MG CAPS (INDOMETHACIN) 1 three times a day pc for gout  #90 x 11   Entered and Authorized by:   Judithann Sheen MD   Signed by:   Judithann Sheen MD on 02/23/2010   Method used:   Electronically to        [facility] [phone]* (retail)       [address]       [place], [place]  [address]       Ph: [phone]       Fax: [phone]   RxID:   [id number] SKELAXIN 800 MG  TABS (METAXALONE) 1 qid for spasm  #120 x 11   Entered and Authorized by:   Judithann Sheen MD   Signed by:   Judithann Sheen MD on 02/23/2010   Method used:   Electronically to        [facility] [phone]* (retail)       [address]       [place],  [place]  [address]       Ph: [phone]       Fax: [phone]   RxID:   [id number] IBUPROFEN 800 MG  TABS (IBUPROFEN) three times a day  #90 x 11   Entered and Authorized by:   Judithann Sheen MD   Signed by:   Judithann Sheen MD on 02/23/2010   Method used:   Electronically to        [facility] [phone]* (retail)       [address]       [place], [place]  [address]       Ph: [phone]       Fax: [phone]   RxID:   [id number] TEMAZEPAM 30 MG  CAPS (TEMAZEPAM) 1 hs as needed sleep needs ov prior to refills  #30 x 5   Entered and Authorized by:   Judithann Sheen MD   Signed by:   Judithann Sheen MD on 02/23/2010   Method used:   Print then Give to Patient   RxID:   [id number] ALPRAZOLAM 0.5 MG TABS (ALPRAZOLAM) 1 tablet morn midafternoon and 2 has for stress  #120 x 5   Entered and Authorized by:   Judithann Sheen MD   Signed by:   Judithann Sheen MD on 02/23/2010   Method used:   Print then Give to Patient   RxID:   [id number]

## 2010-08-16 NOTE — Assessment & Plan Note (Signed)
Summary: cyst on coccyx/dm [facility] WITH PT/MHF   Vital Signs:  Patient profile:   60 year old male Height:      71 inches Weight:      182 pounds BMI:     25.48 O2 Sat:      97 % on Room air Temp:     98.2 degrees F Pulse rate:   82 / minute BP sitting:   130 / 92  (left arm)  Vitals Entered By: Pura Spice, RN (December 30, 2008 8:42 AM)  O2 Flow:  Room air CC: ?CYST ON COCCYX AND HAS BEEN DRAINING X 7 MONTHS    History of Present Illness: Pt feel on coccyx area 7 months ago and aggrevated a pilonidal cyst and it has been draining off and on since that time, increased lately and tender, so came in for evaluation and tx also in past month has had flare up of back and is now wearing brace on his regular medications  Allergies (verified): No Known Drug Allergies  Review of Systems      See HPI General:  See HPI; Denies chills, fatigue, fever, loss of appetite, malaise, sleep disorder, sweats, weakness, and weight loss. ENT:  Complains of nasal congestion. CV:  Denies bluish discoloration of lips or nails, chest pain or discomfort, difficulty breathing at night, difficulty breathing while lying down, fainting, fatigue, leg cramps with exertion, lightheadness, near fainting, palpitations, shortness of breath with exertion, swelling of feet, swelling of hands, and weight gain. Resp:  Denies chest discomfort, chest pain with inspiration, cough, coughing up blood, excessive snoring, hypersomnolence, morning headaches, pleuritic, shortness of breath, sputum productive, and wheezing. GI:  Denies abdominal pain, bloody stools, change in bowel habits, constipation, dark tarry stools, diarrhea, excessive appetite, gas, hemorrhoids, indigestion, loss of appetite, nausea, vomiting, vomiting blood, and yellowish skin color. GU:  Denies decreased libido, discharge, dysuria, erectile dysfunction, genital sores, hematuria, incontinence, nocturia, urinary frequency, and urinary hesitancy. MS:  Complains  of low back pain.  Physical Exam  General:  Well-developed,well-nourished,in no acute distress; alert,appropriate and cooperative throughout examination Head:  Normocephalic and atraumatic without obvious abnormalities. No apparent alopecia or balding. Eyes:  No corneal or conjunctival inflammation noted. EOMI. Perrla. Funduscopic exam benign, without hemorrhages, exudates or papilledema. Vision grossly normal. Ears:  External ear exam shows no significant lesions or deformities.  Otoscopic examination reveals clear canals, tympanic membranes are intact bilaterally without bulging, retraction, inflammation or discharge. Hearing is grossly normal bilaterally. Nose:  pale boggy nasal mucosa with clear drainage Mouth:  Oral mucosa and oropharynx without lesions or exudates.  Teeth in good repair. Lungs:  Normal respiratory effort, chest expands symmetrically. Lungs are clear to auscultation, no crackles or wheezes. Heart:  Normal rate and regular rhythm. S1 and S2 normal without gallop, murmur, click, rub or other extra sounds. Rectal:  dimple above anus , small cyst, not draining today Prostate:  Prostate gland firm and smooth, no enlargement, nodularity, tenderness, mass, asymmetry or induration. Msk:  muscle spasm lumbar spie Extremities:  No clubbing, cyanosis, edema, or deformity noted with normal full range of motion of all joints.     Impression & Recommendations:  Problem # 1:  ALLERGIC RHINITIS (ICD-477.9) Assessment New  His updated medication list for this problem includes:    Omnaris 50 Mcg/act Susp (Ciclesonide) .Marland Kitchen... 2 sprays each nostril  Problem # 2:  PILONIDAL CYST (ICD-685.1) Assessment: New  Orders: Prescription Created Electronically [phone])  Problem # 3:  GERD (ICD-530.81) Assessment:  Improved  His updated medication list for this problem includes:    Protonix 40 Mg Pack (Pantoprazole sodium) .Marland Kitchen... Take 1 tablet by mouth once a day  Problem # 4:  BACK PAIN,  LUMBOSACRAL, CHRONIC (ICD-724.5) Assessment: Unchanged  His updated medication list for this problem includes:    Ibuprofen 800 Mg Tabs (Ibuprofen) .Marland Kitchen... Three times a day    Adult Aspirin Low Strength 81 Mg Tbdp (Aspirin) ..... Once daily    Cyclobenzaprine Hcl 10 Mg Tabs (Cyclobenzaprine hcl) .Marland Kitchen... Three times a day    Skelaxin 800 Mg Tabs (Metaxalone) .Marland Kitchen... 1 qid for spasm  Complete Medication List: 1)  Alprazolam 0.5 Mg Tabs (Alprazolam) .Marland Kitchen.. 1 tablet morn midafternoon and 2 has for stress 2)  Protonix 40 Mg Pack (Pantoprazole sodium) .... Take 1 tablet by mouth once a day 3)  Triamcinolone Acetonide 0.1 % Crea (Triamcinolone acetonide) .... As needed 4)  Ibuprofen 800 Mg Tabs (Ibuprofen) .... Three times a day 5)  Adult Aspirin Low Strength 81 Mg Tbdp (Aspirin) .... Once daily 6)  Cyclobenzaprine Hcl 10 Mg Tabs (Cyclobenzaprine hcl) .... Three times a day 7)  Temazepam 30 Mg Caps (Temazepam) .Marland Kitchen.. 1 hs as needed sleep 8)  Skelaxin 800 Mg Tabs (Metaxalone) .Marland Kitchen.. 1 qid for spasm 9)  Doxycycline Monohydrate 100 Mg Caps (Doxycycline monohydrate) .Marland Kitchen.. 1 two times a day for infection 10)  Omnaris 50 Mcg/act Susp (Ciclesonide) .... 2 sprays each nostril  Patient Instructions: 1)  Pilonidal cyst with low grade infectionn 2)  Doxycycline 100mg   for 1 month 3)  Schedule opx in August not beteen Aug 11 and 19th 4)  omnaris for allergic rhinitis, 2 sprays each nostril  each day Prescriptions: DOXYCYCLINE MONOHYDRATE 100 MG CAPS (DOXYCYCLINE MONOHYDRATE) 1 two times a day for infection  #60 x 0   Entered and Authorized by:   Judithann Sheen MD   Signed by:   Judithann Sheen MD on 12/30/2008   Method used:   Electronically to        [facility] [phone]* (retail)       [address]       [place], [place]  [address]       Ph: [phone]       Fax: [phone]   RxID:   [id number]

## 2010-08-16 NOTE — Assessment & Plan Note (Signed)
Summary: cpx/njr   Vital Signs:  Patient Profile:   60 Years Old Male Weight:      179.5 pounds Temp:     98.3 degrees F oral BP sitting:   120 / 84  (left arm)  Vitals Entered By: Sindy Guadeloupe (February 21, 2007 10:36 AM)               Chief Complaint:  cpx.  History of Present Illness: here for cpx essentially unchaanged with no new problems. has had infrequent episodes ogf back pain. protonix controls his gerd. Alprazojlam helps his stress.erectile dysfunction controlled with viagra 100mg .  ibruporfen 800 mg three times a day for  chronic back pain. In general, doing well.  Current Allergies: No known allergies      Review of Systems  General      Denies chills, fatigue, fever, loss of appetite, malaise, sleep disorder, sweats, weakness, and weight loss.  Eyes      Denies blurring, discharge, double vision, eye irritation, eye pain, halos, itching, light sensitivity, red eye, vision loss-1 eye, and vision loss-both eyes.  ENT      Denies decreased hearing, difficulty swallowing, ear discharge, earache, hoarseness, nasal congestion, nosebleeds, postnasal drainage, ringing in ears, sinus pressure, and sore throat.  CV      Denies bluish discoloration of lips or nails, chest pain or discomfort, difficulty breathing at night, difficulty breathing while lying down, fainting, fatigue, leg cramps with exertion, lightheadness, near fainting, palpitations, shortness of breath with exertion, swelling of feet, swelling of hands, and weight gain.  Resp      Denies chest discomfort, chest pain with inspiration, cough, coughing up blood, excessive snoring, hypersomnolence, morning headaches, pleuritic, shortness of breath, sputum productive, and wheezing.  GI      Denies abdominal pain, bloody stools, change in bowel habits, constipation, dark tarry stools, diarrhea, excessive appetite, gas, hemorrhoids, indigestion, loss of appetite, nausea, vomiting, vomiting blood, and  yellowish skin color.  GU      Complains of erectile dysfunction.      viagra med rx  MS      Denies joint pain, joint redness, joint swelling, loss of strength, low back pain, mid back pain, muscle aches, muscle , cramps, muscle weakness, stiffness, and thoracic pain.  Derm      Denies changes in color of skin, changes in nail beds, dryness, excessive perspiration, flushing, hair loss, insect bite(s), itching, lesion(s), poor wound healing, and rash.  Neuro      Denies brief paralysis, difficulty with concentration, disturbances in coordination, falling down, headaches, inability to speak, memory loss, numbness, poor balance, seizures, sensation of room spinning, tingling, tremors, visual disturbances, and weakness.  Psych      Denies alternate hallucination ( auditory/visual), anxiety, depression, easily angered, easily tearful, irritability, mental problems, panic attacks, sense of great danger, suicidal thoughts/plans, thoughts of violence, unusual visions or sounds, and thoughts /plans of harming others.  Endo      Denies cold intolerance, excessive hunger, excessive thirst, excessive urination, heat intolerance, polyuria, and weight change.  Heme      Denies abnormal bruising, bleeding, enlarge lymph nodes, fevers, pallor, and skin discoloration.  Allergy      Denies hives or rash, itching eyes, persistent infections, seasonal allergies, and sneezing.  ENT      Denies decreased hearing, difficulty swallowing, ear discharge, earache, hoarseness, nasal congestion, nosebleeds, postnasal drainage, ringing in ears, sinus pressure, and sore throat.   Physical Exam  General:  Well-developed,well-nourished,in no acute distress; alert,appropriate and cooperative throughout examination Head:     Normocephalic and atraumatic without obvious abnormalities. No apparent alopecia or balding. Eyes:     No corneal or conjunctival inflammation noted. EOMI. Perrla. Funduscopic exam  benign, without hemorrhages, exudates or papilledema. Vision grossly normal. Ears:     External ear exam shows no significant lesions or deformities.  Otoscopic examination reveals clear canals, tympanic membranes are intact bilaterally without bulging, retraction, inflammation or discharge. Hearing is grossly normal bilaterally. Nose:     External nasal examination shows no deformity or inflammation. Nasal mucosa are pink and moist without lesions or exudates.  Minimal nasal congestion-contrlolled with nasacort aq Mouth:     Oral mucosa and oropharynx without lesions or exudates.  Teeth in good repair.Multiple fillings. Abdomen:     Bowel sounds positive,abdomen soft and non-tender without masses, organomegaly or hernias noted. Rectal:     No external abnormalities noted. Normal sphincter tone. No rectal masses or tenderness. Genitalia:     Testes bilaterally descended without nodularity, tenderness or masses. No scrotal masses or lesions. No penis lesions or urethral discharge. Prostate:     Prostate gland firm and smooth, no enlargement, nodularity, tenderness, mass, asymmetry or induration. Msk:     No deformity or scoliosis noted of thoracic or lumbar spine.   Pulses:     R and L carotid,radial,femoral,dorsalis pedis and posterior tibial pulses are full and equal bilaterally Extremities:     No clubbing, cyanosis, edema, or deformity noted with normal full range of motion of all joints.   Neurologic:     No cranial nerve deficits noted. Station and gait are normal. Plantar reflexes are down-going bilaterally. DTRs are symmetrical throughout. Sensory, motor and coordinative functions appear intact. Skin:     Intact without suspicious lesions or rashes. Has occ contact dermatiits. Tx with Triamincolone cream Cervical Nodes:     No lymphadenopathy noted Axillary Nodes:     No palpable lymphadenopathy Inguinal Nodes:     No significant adenopathy Psych:     Cognition and judgment  appear intact. Alert and cooperative with normal attention span and concentration. No apparent delusions, illusions, hallucinations    Impression & Recommendations:  Problem # 1:  HEALTH MAINTENANCE EXAM (ICD-V70.0) Assessment: Improved  Problem # 2:  CONTACT DERMATITIS (ICD-692.9)  His updated medication list for this problem includes:    Triamcinolone Acetonide 0.1 % Crea (Triamcinolone acetonide) .Marland Kitchen... As needed   Complete Medication List: 1)  Alprazolam 0.5 Mg Tabs (Alprazolam) 2)  Ambien Cr 12.5 Mg Tbcr (Zolpidem tartrate) 3)  Protonix 40 Mg Pack (Pantoprazole sodium) .... Take 1 tablet by mouth once a day 4)  Triamcinolone Acetonide 0.1 % Crea (Triamcinolone acetonide) .... As needed 5)  Ibuprofen 800 Mg Tabs (Ibuprofen) .... Three times a day 6)  Adult Aspirin Low Strength 81 Mg Tbdp (Aspirin) .... Once daily     Prescriptions: IBUPROFEN 800 MG  TABS (IBUPROFEN) three times a day  #90 x 12   Entered and Authorized by:   Judithann Sheen MD   Signed by:   Judithann Sheen MD on 02/21/2007   Method used:   Handwritten   RxID:   [id number] TRIAMCINOLONE ACETONIDE 0.1 %  CREA (TRIAMCINOLONE ACETONIDE) as needed  #30 gram x 12   Entered and Authorized by:   Judithann Sheen MD   Signed by:   Judithann Sheen MD on 02/21/2007   Method used:   Handwritten  RxID:   [id number] PROTONIX 40 MG  PACK (PANTOPRAZOLE SODIUM) Take 1 tablet by mouth once a day  #30 x 11   Entered and Authorized by:   Judithann Sheen MD   Signed by:   Judithann Sheen MD on 02/21/2007   Method used:   Handwritten   RxID:   [id number] AMBIEN CR 12.5 MG TBCR (ZOLPIDEM TARTRATE)   #30 x 5   Entered and Authorized by:   Judithann Sheen MD   Signed by:   Judithann Sheen MD on 02/21/2007   Method used:   Handwritten   RxID:   [id number] ALPRAZOLAM 0.5 MG TABS (ALPRAZOLAM)   #90 x 5   Entered and Authorized by:   Judithann Sheen MD    Signed by:   Judithann Sheen MD on 02/21/2007   Method used:   Handwritten   RxID:   [id number]        Appended Document: cpx/njr labs discussed with patient and copy given to pt.Lipids were elevated mildly -pt instructed to diet and exercise. chol decreased from last yr.

## 2010-08-16 NOTE — Assessment & Plan Note (Signed)
Summary: PER NURSE//  Nurse Visit    Prior Medications: ALPRAZOLAM 0.5 MG TABS (ALPRAZOLAM)  AMBIEN CR 12.5 MG TBCR (ZOLPIDEM TARTRATE)  PROTONIX 40 MG  PACK (PANTOPRAZOLE SODIUM) Take 1 tablet by mouth once a day TRIAMCINOLONE ACETONIDE 0.1 %  CREA (TRIAMCINOLONE ACETONIDE) as needed IBUPROFEN 800 MG  TABS (IBUPROFEN) three times a day ADULT ASPIRIN LOW STRENGTH 81 MG  TBDP (ASPIRIN) once daily Current Allergies: No known allergies     Medication Administration  Injection # 1:    Medication: Depo- Medrol 80mg     Diagnosis: GOUT NOS (ICD-274.9)    Route: IM    Site: RUOQ gluteus    Exp Date: 09/09    Lot #: [id number]    Mfr: SIRCOR    Comments: 120 MG DEPO MEDROL GIVEN  VO DR STAFFORD FOR SX GOUT RT FOOT    Patient tolerated injection without complications    Given by: Pura Spice, RN (March 20, 2007 4:54 PM)  Injection # 2:    Medication: Depo- Medrol 40mg     Diagnosis: GOUT NOS (ICD-274.9)    Route: IM    Site: RUOQ gluteus    Exp Date: 09/09    Lot #: [id number]    Mfr: SIRCOR    Given by: Pura Spice, RN (March 20, 2007 4:55 PM)  Orders Added: 1)  Depo- Medrol 80mg  [J1040] 2)  Depo- Medrol 40mg  [J1030] 3)  Admin of Therapeutic Inj  intramuscular or subcutaneous [90772]      Medication Administration  Injection # 1:    Medication: Depo- Medrol 80mg     Diagnosis: GOUT NOS (ICD-274.9)    Route: IM    Site: RUOQ gluteus    Exp Date: 09/09    Lot #: [id number]    Mfr: SIRCOR    Comments: 120 MG DEPO MEDROL GIVEN  VO DR STAFFORD FOR SX GOUT RT FOOT    Patient tolerated injection without complications    Given by: Pura Spice, RN (March 20, 2007 4:54 PM)  Injection # 2:    Medication: Depo- Medrol 40mg     Diagnosis: GOUT NOS (ICD-274.9)    Route: IM    Site: RUOQ gluteus    Exp Date: 09/09    Lot #: [id number]    Mfr: SIRCOR    Given by: Pura Spice, RN (March 20, 2007 4:55 PM)  Orders Added: 1)  Depo- Medrol 80mg  [J1040]  2)  Depo- Medrol 40mg  [J1030] 3)  Admin of Therapeutic Inj  intramuscular or subcutaneous [90772]

## 2010-08-18 NOTE — Progress Notes (Signed)
Summary: change rx  Phone Note Call from Patient   Caller: Patient Call For: Judithann Sheen MD Summary of Call: Pt wants to change back from Temazepam to Ambien CR....[facility] [place] [phone] Initial call taken by: Lynann Beaver CMA AAMA,  August 02, 2010 10:41 AM  Follow-up for Phone Call        ok per Dr Scotty Court with 5 refills, rx called in Follow-up by: Alfred Levins, CMA,  August 03, 2010 3:15 PM    New/Updated Medications: AMBIEN CR 12.5 MG TBCR (ZOLPIDEM TARTRATE) 1 by mouth at bedtime as needed Prescriptions: AMBIEN CR 12.5 MG TBCR (ZOLPIDEM TARTRATE) 1 by mouth at bedtime as needed  #30 x 5   Entered by:   Alfred Levins, CMA   Authorized by:   Judithann Sheen MD   Signed by:   Alfred Levins, CMA on 08/03/2010   Method used:   Telephoned to ...       [facility]  [address] [phone]* (retail)       [address]       [place], [place]  [address]       Ph: [phone]       Fax: [phone]   RxID:   [id number]

## 2010-08-29 ENCOUNTER — Other Ambulatory Visit: Payer: Self-pay | Admitting: Family Medicine

## 2010-09-01 NOTE — Telephone Encounter (Signed)
Pt is calling  Back concerning alprazolam refill

## 2010-09-02 ENCOUNTER — Telehealth: Payer: Self-pay | Admitting: Family Medicine

## 2010-09-02 NOTE — Telephone Encounter (Signed)
Refilled alprazalam

## 2010-09-02 NOTE — Telephone Encounter (Signed)
Pt calling to ck on status of refill /  med: alprazolam being sent into [facility] - [address]..... Pharmacy is stating that they have not received anything.... Pt has been calling since beginning of week to obtain same with no success.

## 2010-09-11 ENCOUNTER — Other Ambulatory Visit: Payer: Self-pay | Admitting: Family Medicine

## 2010-09-19 ENCOUNTER — Telehealth: Payer: Self-pay | Admitting: Family Medicine

## 2010-09-19 DIAGNOSIS — L309 Dermatitis, unspecified: Secondary | ICD-10-CM

## 2010-09-19 NOTE — Telephone Encounter (Signed)
Pt needs a refill on med: triamcinolane cream.... ?   Would like to have [facility] [facility] [employer]

## 2010-09-20 ENCOUNTER — Telehealth: Payer: Self-pay | Admitting: Family Medicine

## 2010-09-20 NOTE — Telephone Encounter (Signed)
[facility] called to adv that they had a v/m left by Dr Scotty Court but that there was not any info for the med left....needs quantitiy and directions for cream.... Need clarification on same.... # [phone],

## 2010-12-02 NOTE — Op Note (Signed)
NAME:  Keith Sanders, Keith Sanders                            ACCOUNT NO.:  [id number]   MEDICAL RECORD NO.:  [id number]                   PATIENT TYPE:  EMS   LOCATION:  [facility]                                 FACILITY:  [facility]   PHYSICIAN:  Dionne Ano. Everlene Other, M.D.         DATE OF BIRTH:  [birth year]   DATE OF PROCEDURE:  03/25/2003  DATE OF DISCHARGE:  03/25/2003                                 OPERATIVE REPORT   INDICATIONS FOR PROCEDURE:  I had the pleasure to see [name] in  the [facility] Department upon kind referral from Dr. Effie Shy.  The  patient is a 60 year old male who had a ringer injury to his left ring  finger today at 1:30.  I was asked to see him at approximately 6:30 p.m.  The patient is 60 years of age.  He is up to date on his shots.  He jumped  from a truck and the ring finger was caught and subsequently he sustained a  laceration and a ringer type injury without gross vascular compromise.  I  was asked to see him and evaluate him.  He does have some lacerations which  are significant.   He is a nonsmoker.  He denies prior vascular problems.   ALLERGIES:  None.   MEDICINES:  Xanax and Advil as needed.   PAST MEDICAL HISTORY:  Low back pain.   PAST SURGICAL HISTORY:  None.   SOCIAL HISTORY:  He does not smoke.  He occasionally consumes alcoholic  beverage.   PHYSICAL EXAMINATION:  GENERAL:  He is alert and oriented and in no acute  distress.  VITAL SIGNS:  Stable.  MUSCULOSKELETAL:  He has a left ring finger with good refill to the tip.  He  had normal sensation prior to digital block placed by the physician  assistant.  The patient notes no locking, popping or catching on exam and  has intact FDP and FDS tendons.  He has a radial and ulnar tear with  hematoma noted underneath superficial and ulnarly a full thickness skin  tear.  The patient does have swelling.  His ring has been removed.  The  adjacent fingers are neurovascularly intact without  abnormality.  X-rays  were reviewed which were negative for fracture, dislocation or specific fine  lesion.   IMPRESSION:  Ringer avulsion injury without vascular embarrassment, left  ring finger.   PLAN:  I verbally consented him for incision and drainage and repair of  structures as necessary.   DESCRIPTION OF PROCEDURE:  He was given a Betadine scrub and paint.  Following this, he was laid supine and appropriately padded and draped.  I  then performed incision and drainage of skin and subcutaneous tissue.  I  dissected meticulously under 4.0 loupe magnification and irrigated the  wounds copiously.  His examination under anesthesia looked excellent.  His  refill was excellent and there was no  significant vascular compromise.  I  discussed with him all issues.   IMPRESSION:  Ringer injury, left ring finger without vascular embarrassment.   PLAN:  I placed him on Keflex as well as Vicodin.  He will return to the  office to see me in less than five days, continue gentle elevation and I  placed him in a special dressing to keep his finger stable.  If there is any  loss of refill or other problems, he will notify me, otherwise, I will plan  too see him back in the office in less than five days.  I have discussed  with him aspirin 81 mg twice daily as well as no caffeine products and  avoiding cold exposures.  He tolerated the incision and drainage well and  there were no complicating features.  I appreciate the opportunity to see  him in consultation.                                               Dionne Ano. Everlene Other, M.D.    [facility]  D:  03/25/2003  T:  03/26/2003  Job:  [id number]   cc:   Markham Jordan L. Effie Shy, M.D.  [address]  [place] [address]  Fax: [phone]

## 2011-01-18 ENCOUNTER — Other Ambulatory Visit: Payer: Self-pay | Admitting: Family Medicine

## 2011-01-23 ENCOUNTER — Ambulatory Visit: Payer: Self-pay | Admitting: Family Medicine

## 2011-01-25 ENCOUNTER — Other Ambulatory Visit: Payer: Self-pay | Admitting: Family Medicine

## 2011-01-27 ENCOUNTER — Other Ambulatory Visit: Payer: Self-pay

## 2011-01-27 MED ORDER — ZOLPIDEM TARTRATE ER 12.5 MG PO TBCR: 12.5000 mg | EXTENDED_RELEASE_TABLET | Freq: Every evening | ORAL | Status: DC | PRN

## 2011-01-27 NOTE — Telephone Encounter (Signed)
rx request for zolpidem tart er 12. 5---ok per Dr. Scotty Court to fill once; pt needs an ov for future refills.

## 2011-02-02 ENCOUNTER — Ambulatory Visit (INDEPENDENT_AMBULATORY_CARE_PROVIDER_SITE_OTHER): Payer: PRIVATE HEALTH INSURANCE | Admitting: Family Medicine

## 2011-02-02 ENCOUNTER — Encounter: Payer: Self-pay | Admitting: Family Medicine

## 2011-02-02 VITALS — BP 120/74 | HR 70 | Temp 99.0°F | Ht 71.0 in | Wt 175.0 lb

## 2011-02-02 DIAGNOSIS — G8929 Other chronic pain: Secondary | ICD-10-CM

## 2011-02-02 DIAGNOSIS — R197 Diarrhea, unspecified: Secondary | ICD-10-CM

## 2011-02-02 DIAGNOSIS — M549 Dorsalgia, unspecified: Secondary | ICD-10-CM

## 2011-02-02 DIAGNOSIS — J0110 Acute frontal sinusitis, unspecified: Secondary | ICD-10-CM

## 2011-02-02 MED ORDER — ZOLPIDEM TARTRATE ER 12.5 MG PO TBCR: 12.5000 mg | EXTENDED_RELEASE_TABLET | Freq: Every evening | ORAL | Status: DC | PRN

## 2011-02-02 MED ORDER — DIPHENOXYLATE-ATROPINE 2.5-0.025 MG PO TABS: 2.0000 | ORAL_TABLET | Freq: Four times a day (QID) | ORAL | Status: DC | PRN

## 2011-02-02 MED ORDER — BECLOMETHASONE DIPROPIONATE 80 MCG/ACT NA AERS: 2.0000 | INHALATION_SPRAY | NASAL | Status: DC

## 2011-02-02 MED ORDER — LEVOFLOXACIN 500 MG PO TABS: 500.0000 mg | ORAL_TABLET | Freq: Every day | ORAL | Status: AC

## 2011-02-02 NOTE — Patient Instructions (Addendum)
You are having reoccuraence of URI or sinusitis as well as diarrhea infection or from antibiotic Starting Levaquin for infection, lomotil for diarrhea, Qnasl nasal spray, continue mucinex dm Continue to have good fluid intake In regard to flying to prevent ear problems get Afrin nasal spray use 2 sprays prior to taking all and then 2 sprays each nostril descending each time you do this on your right Take the lomotil with you on your trip in case K. Or you need it for diarrhea

## 2011-03-08 ENCOUNTER — Other Ambulatory Visit: Payer: Self-pay | Admitting: Family Medicine

## 2011-03-13 ENCOUNTER — Other Ambulatory Visit: Payer: Self-pay

## 2011-03-13 MED ORDER — FLUCONAZOLE 200 MG PO TABS: 200.0000 mg | ORAL_TABLET | Freq: Every day | ORAL | Status: AC

## 2011-03-13 NOTE — Telephone Encounter (Signed)
Ok per Dr. Scotty Court to fill fluconazole 200 mg for 1 year.

## 2011-03-14 ENCOUNTER — Other Ambulatory Visit: Payer: Self-pay | Admitting: Family Medicine

## 2011-03-20 NOTE — Progress Notes (Signed)
  Subjective:    Patient ID: Keith Sanders, male    DOB: 09/05/1950, 60 y.o.   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 HPI    Review of Systems systems review negative along the history of present illness     Objective:   Physical Exam patient is a well-built well-nourished white male who appears acutely ill in no distress HEENT reveals tenderness on the frontal sinuses nasal congestion erythematous mucosa slimy yellow drainage ears are normal pharynx is normal Lungs clear to palpation percussion and auscultation no rales no dullness no wheezing [place] is cardiomegaly heart sounds are good without murmurs or of pulsations are good and equal bilaterally the rhythm Abdomen minimal tenderness over the colon and slight increase bowel sounds liver spleen kidneys are nonpalpable no referred pain or rebound tenderness          Assessment & Plan:  Reoccurrence of sinusitis to treat with Levaquin 500 mg daily Acute enteritis secondary to Augmentin him to treat with clear liquids plus Lomotil  one to 2 4 times a day 10 you Mucinex D twice a day  continue good oral fluid intake

## 2011-03-21 ENCOUNTER — Other Ambulatory Visit: Payer: Self-pay | Admitting: Family Medicine

## 2011-06-09 ENCOUNTER — Other Ambulatory Visit: Payer: Self-pay | Admitting: Family Medicine

## 2011-06-15 NOTE — Telephone Encounter (Signed)
Refill once until establishes with new primary.

## 2011-06-15 NOTE — Telephone Encounter (Signed)
Pt last seen 02/02/11.  Pt to establish with Dr. Milinda Cave in Jan 2013.

## 2011-06-21 NOTE — Telephone Encounter (Signed)
Refill once 

## 2011-07-12 ENCOUNTER — Encounter: Payer: Self-pay | Admitting: Family Medicine

## 2011-07-20 ENCOUNTER — Encounter: Payer: Self-pay | Admitting: Family Medicine

## 2011-07-20 ENCOUNTER — Ambulatory Visit (INDEPENDENT_AMBULATORY_CARE_PROVIDER_SITE_OTHER): Payer: PRIVATE HEALTH INSURANCE | Admitting: Family Medicine

## 2011-07-20 VITALS — BP 142/91 | HR 82 | Temp 97.7°F | Ht 71.0 in | Wt 181.0 lb

## 2011-07-20 DIAGNOSIS — K219 Gastro-esophageal reflux disease without esophagitis: Secondary | ICD-10-CM

## 2011-07-20 DIAGNOSIS — M549 Dorsalgia, unspecified: Secondary | ICD-10-CM

## 2011-07-20 DIAGNOSIS — L259 Unspecified contact dermatitis, unspecified cause: Secondary | ICD-10-CM

## 2011-07-20 DIAGNOSIS — Z23 Encounter for immunization: Secondary | ICD-10-CM

## 2011-07-20 NOTE — Assessment & Plan Note (Signed)
Problem stable.  Continue current medications and diet appropriate for this condition.  We have reviewed our general long term plan for this problem and also reviewed symptoms and signs that should prompt the patient to call or return to the office.  

## 2011-07-20 NOTE — Progress Notes (Addendum)
OFFICE VISIT  07/20/2011   CC:  Chief Complaint  Patient presents with  . Establish Care    transfer from Dr. Scotty Court, no problems     HPI:    Patient is a 61 y.o. Caucasian male who presents for initial visit as a transfer pt from Dr. Angus Palms, who retired recently from [employer].  No acute complaints--feels well. Has occasional flare ups of acute LBP but also has chronic mild/nagging LBP for which he takes ibuprofen for on a fairly routine basis. Denies any history of gastritis/bleeding on this.  Takes muscle relaxer only very rarely. Anxiety: takes approx 90 xanax per month on average. Has some recurrent rashes described as contact derm to various things--"sensitive skin"--has steroid cream to use prn.  Past Medical History  Diagnosis Date  . Chronic insomnia   . Chronic lumbar pain     DDD; ibuprofen, occasional muscle relaxers  . Eczema   . PUD (peptic ulcer disease)   . Diverticulitis   Anxiety  No past surgical history on file.  Outpatient Prescriptions Prior to Visit  Medication Sig Dispense Refill  . ALPRAZolam (XANAX) 0.5 MG tablet take 1 tablet by mouth four times a day if needed for STRESS  120 tablet  5  . aspirin 81 MG tablet Take 81 mg by mouth daily.        . fluticasone (FLONASE) 50 MCG/ACT nasal spray INSTILL 2 SPRAYS INTO EACH NOSTRIL DAILY AS NEEDED FOR ALLERGIES  2 Act  11  . ibuprofen (ADVIL,MOTRIN) 800 MG tablet take 1 tablet by mouth three times a day  90 tablet  5  . metaxalone (SKELAXIN) 800 MG tablet Take 800 mg by mouth 4 (four) times daily.        . pantoprazole (PROTONIX) 40 MG tablet take 1 tablet by mouth once daily  30 tablet  5  . triamcinolone (KENALOG) 0.1 % cream Apply 1 application topically 2 (two) times daily.        Marland Kitchen nystatin (MYCOSTATIN) 100000 UNIT/ML suspension Take 500,000 Units by mouth as needed. 5 ml to each side of mouth qid as instructed       . Beclomethasone Dipropionate (QNASL) 80 MCG/ACT AERS Place 2 Act into  the nose 7 days.  8.7 g  1  . diphenoxylate-atropine (LOMOTIL) 2.5-0.025 MG per tablet Take 2 tablets by mouth 4 (four) times daily as needed for diarrhea/loose stools.  60 tablet  1  . indomethacin (INDOCIN) 50 MG capsule Take 50 mg by mouth 3 (three) times daily with meals.          No Known Allergies  ROS As per HPI  PE: Blood pressure 142/91, pulse 82, temperature 97.7 F (36.5 C), temperature source Temporal, height 5\' 11"  (1.803 m), weight 181 lb (82.101 kg), SpO2 97.00%. Gen: Alert, well appearing.  Patient is oriented to person, place, time, and situation. ENT: Ears: EACs clear, normal epithelium.  TMs with good light reflex and landmarks bilaterally.  Eyes: no injection, icteris, swelling, or exudate.  EOMI, PERRLA. Nose: no drainage or turbinate edema/swelling.  No injection or focal lesion.  Mouth: lips without lesion/swelling.  Oral mucosa pink and moist.  Dentition intact and without obvious caries or gingival swelling.  Oropharynx without erythema, exudate, or swelling.  CV: RRR, no m/r/g.   LUNGS: CTA bilat, nonlabored resps, good aeration in all lung fields. ABD: soft, NT, ND, BS normal.  No hepatospenomegaly or mass.  No bruits. EXT: no clubbing, cyanosis, or edema.  LABS:  None today  IMPRESSION AND PLAN:  Transfer pt; initial visit to establish.  BACK PAIN, LUMBOSACRAL, CHRONIC Problem stable.  Continue current medications and diet appropriate for this condition.  We have reviewed our general long term plan for this problem and also reviewed symptoms and signs that should prompt the patient to call or return to the office.   CONTACT DERMATITIS Recurrrent.  Problem stable.  Continue current medications and diet appropriate for this condition.  We have reviewed our general long term plan for this problem and also reviewed symptoms and signs that should prompt the patient to call or return to the office.   GERD Problem stable.  Continue current medications and  diet appropriate for this condition.  We have reviewed our general long term plan for this problem and also reviewed symptoms and signs that should prompt the patient to call or return to the office.    Tdap given today. He declined flu vaccine today.  FOLLOW UP: Return for 6-8 months for CPE with fasting lab work.

## 2011-07-20 NOTE — Assessment & Plan Note (Signed)
Recurrrent.  Problem stable.  Continue current medications and diet appropriate for this condition.  We have reviewed our general long term plan for this problem and also reviewed symptoms and signs that should prompt the patient to call or return to the office.

## 2011-08-17 ENCOUNTER — Other Ambulatory Visit: Payer: Self-pay | Admitting: Family Medicine

## 2011-08-18 NOTE — Telephone Encounter (Signed)
Last seen 1/3 as new patient (transfer from Dr. Scotty Court).  Follow up in 6-8 months for CPE.  Please advise refills. Thanks!!

## 2011-08-22 ENCOUNTER — Encounter: Payer: Self-pay | Admitting: Family Medicine

## 2011-08-22 ENCOUNTER — Ambulatory Visit (INDEPENDENT_AMBULATORY_CARE_PROVIDER_SITE_OTHER): Payer: PRIVATE HEALTH INSURANCE | Admitting: Family Medicine

## 2011-08-22 VITALS — BP 167/102 | HR 74 | Temp 98.0°F | Ht 71.0 in | Wt 180.1 lb

## 2011-08-22 DIAGNOSIS — M109 Gout, unspecified: Secondary | ICD-10-CM

## 2011-08-22 MED ORDER — METHYLPREDNISOLONE ACETATE PF 40 MG/ML IJ SUSP
40.0000 mg | Freq: Once | INTRAMUSCULAR | Status: AC
  Administered 2011-08-22: 40 mg via INTRAMUSCULAR

## 2011-08-22 NOTE — Progress Notes (Signed)
OFFICE NOTE  08/22/2011  CC:  Chief Complaint  Patient presents with  . Gout    began Saturday- right foot     HPI: Patient is a 61 y.o. Caucasian male who is here for right great toe pain. Onset 2-3 days ago, no preceding trauma.  +Red, +mild swelling, can barely get boots on due to tenderness, +limp.  Has hx of gout in this same area x 15 yrs, most recent was about 2010. Was on allopurinol at one time in distant past but since flares have been so infrequent in the last few years he got off this med.  Has taken indocin 50mg  tid x 2d and hasn't seen any improvement. No f/c or malaise.  No other joint problems. He did eat a large pork barbecue dinner the night prior to onset of pain.  Pertinent PMH:  Gout LBP GERD Insomnia IBS?--not a good candidate for colcrys trial for gout due to this dx  MEDS:  Outpatient Prescriptions Prior to Visit  Medication Sig Dispense Refill  . ALPRAZolam (XANAX) 0.5 MG tablet take 1 tablet by mouth four times a day if needed for STRESS  120 tablet  5  . Ascorbic Acid (VITAMIN C) 1000 MG tablet Take 1,000 mg by mouth daily.        Marland Kitchen aspirin 81 MG tablet Take 81 mg by mouth daily.        . fluticasone (FLONASE) 50 MCG/ACT nasal spray INSTILL 2 SPRAYS INTO EACH NOSTRIL DAILY AS NEEDED FOR ALLERGIES  2 Act  11  . ibuprofen (ADVIL,MOTRIN) 800 MG tablet take 1 tablet by mouth three times a day  90 tablet  5  . metaxalone (SKELAXIN) 800 MG tablet Take 800 mg by mouth 4 (four) times daily.        . Multiple Vitamin (MULTIVITAMIN) tablet Take 1 tablet by mouth daily.        Marland Kitchen nystatin (MYCOSTATIN) 100000 UNIT/ML suspension Take 500,000 Units by mouth as needed. 5 ml to each side of mouth qid as instructed       . Omega-3 Fatty Acids (FISH OIL) 875 MG CAPS Take 2 capsules by mouth daily.        . pantoprazole (PROTONIX) 40 MG tablet take 1 tablet by mouth once daily  30 tablet  5  . triamcinolone (KENALOG) 0.1 % cream Apply 1 application topically 2 (two) times  daily.        Marland Kitchen zolpidem (AMBIEN CR) 12.5 MG CR tablet take 1 tablet at bedtime if needed  30 tablet  5   No facility-administered medications prior to visit.    PE: Blood pressure 167/102, pulse 74, temperature 98 F (36.7 C), temperature source Temporal, height 5\' 11"  (1.803 m), weight 180 lb 1.9 oz (81.702 kg), SpO2 97.00%. Gen: Alert, well appearing.  Patient is oriented to person, place, time, and situation. Right great toe MTP joint erythematous, +blanchable, exquisitely tender on lateral and dorsal aspect of MTP jt.  Plantar surface nontender.  +warmth on this joint.  He can move great toe some but limited by pain.  PT and DP pulses 2+.   No edema.  IMPRESSION AND PLAN:  Podagra: failed indocin.  Will give 40mg  depo-medrol in office today.   He declined my offer of a few pain pills today. His bp is a bit elevated likely from his NSAID.  Home monitoring has been normal. He'll continue to monitor.  He is aware of diet that can potentiate flares. NO prophylactic med  indicated at this time unless these begin to become fairly regular.  FOLLOW UP: prn

## 2011-10-12 ENCOUNTER — Other Ambulatory Visit: Payer: Self-pay | Admitting: Family Medicine

## 2011-10-12 ENCOUNTER — Other Ambulatory Visit: Payer: Self-pay

## 2011-10-12 MED ORDER — FLUTICASONE PROPIONATE 50 MCG/ACT NA SUSP: 2.0000 | NASAL | Status: DC | PRN

## 2011-10-24 ENCOUNTER — Other Ambulatory Visit: Payer: Self-pay | Admitting: Family Medicine

## 2011-11-06 ENCOUNTER — Other Ambulatory Visit: Payer: Self-pay | Admitting: Family Medicine

## 2011-11-10 ENCOUNTER — Other Ambulatory Visit: Payer: Self-pay | Admitting: Family Medicine

## 2011-12-19 ENCOUNTER — Other Ambulatory Visit: Payer: Self-pay | Admitting: Family Medicine

## 2012-01-05 ENCOUNTER — Other Ambulatory Visit: Payer: Self-pay | Admitting: Family Medicine

## 2012-01-05 NOTE — Telephone Encounter (Signed)
eScribe request for refill on FLONASE  Follow up needed August-October for CPE RX sent

## 2012-02-01 ENCOUNTER — Other Ambulatory Visit: Payer: Self-pay | Admitting: Family Medicine

## 2012-02-01 NOTE — Telephone Encounter (Signed)
RX faxed

## 2012-02-01 NOTE — Telephone Encounter (Signed)
eScribe request for refill on AMBIEN Last seen on 07/20/11 Follow up 6-8 months for CPE, scheduled on 03/05/12. Please advise refills.

## 2012-02-27 ENCOUNTER — Ambulatory Visit (INDEPENDENT_AMBULATORY_CARE_PROVIDER_SITE_OTHER): Payer: PRIVATE HEALTH INSURANCE | Admitting: Family Medicine

## 2012-02-27 ENCOUNTER — Encounter: Payer: Self-pay | Admitting: Family Medicine

## 2012-02-27 ENCOUNTER — Other Ambulatory Visit (INDEPENDENT_AMBULATORY_CARE_PROVIDER_SITE_OTHER): Payer: PRIVATE HEALTH INSURANCE

## 2012-02-27 VITALS — BP 140/96 | HR 78 | Temp 96.6°F | Ht 69.0 in | Wt 178.0 lb

## 2012-02-27 DIAGNOSIS — L219 Seborrheic dermatitis, unspecified: Secondary | ICD-10-CM | POA: Insufficient documentation

## 2012-02-27 DIAGNOSIS — L218 Other seborrheic dermatitis: Secondary | ICD-10-CM

## 2012-02-27 DIAGNOSIS — Z0000 Encounter for general adult medical examination without abnormal findings: Secondary | ICD-10-CM

## 2012-02-27 LAB — CBC
HCT: 45.7 % (ref 39.0–52.0)
Hemoglobin: 15.5 g/dL (ref 13.0–17.0)
MCHC: 34 g/dL (ref 30.0–36.0)
MCV: 99.1 fl (ref 78.0–100.0)
Platelets: 233 10*3/uL (ref 150.0–400.0)
RBC: 4.61 Mil/uL (ref 4.22–5.81)
RDW: 12.7 % (ref 11.5–14.6)
WBC: 6.6 10*3/uL (ref 4.5–10.5)

## 2012-02-27 MED ORDER — KETOCONAZOLE 2 % EX SHAM: MEDICATED_SHAMPOO | CUTANEOUS | Status: DC

## 2012-02-27 NOTE — Assessment & Plan Note (Signed)
Gradually improving with his recent use of coal tar shampoo. Will rx ketoconazole 2% shampoo to see if he gets any better effect with this.   Recommended OTC nonsedating antihistamine prn for itching. Reassured pt regarding reactive occipital lymph nodes.

## 2012-02-27 NOTE — Progress Notes (Signed)
OFFICE NOTE  02/27/2012  CC: No chief complaint on file.    HPI: Patient is a 61 y.o. Caucasian male who is here for walk in visit for "swollen lymph nodes". Started to have rash on scalp 9-10 d ago--really flaky and scaly and some areas of irritation--itched very bad. Noted a few swollen areas around back of neck.  Used coal-tar shampoo and it is drying it up some. Otherwise has felt ok.   Pertinent PMH:  No hx of seb derm in the past MEDS:  Outpatient Prescriptions Prior to Visit  Medication Sig Dispense Refill  . ALPRAZolam (XANAX) 0.5 MG tablet take 1 tablet by mouth four times a day if needed for STRESS  120 tablet  5  . Ascorbic Acid (VITAMIN C) 1000 MG tablet Take 1,000 mg by mouth daily.        Marland Kitchen aspirin 81 MG tablet Take 81 mg by mouth daily.        . fluticasone (FLONASE) 50 MCG/ACT nasal spray instill 2 sprays into each nostril once daily if needed for allergies  16 g  2  . ibuprofen (ADVIL,MOTRIN) 800 MG tablet take 1 tablet by mouth three times a day  90 tablet  5  . metaxalone (SKELAXIN) 800 MG tablet Take 800 mg by mouth 4 (four) times daily.        . Multiple Vitamin (MULTIVITAMIN) tablet Take 1 tablet by mouth daily.        . Omega-3 Fatty Acids (FISH OIL) 875 MG CAPS Take 2 capsules by mouth daily.        . pantoprazole (PROTONIX) 40 MG tablet take 1 tablet by mouth once daily  30 tablet  5  . triamcinolone cream (KENALOG) 0.1 % apply to affected area twice a day  45 g  5  . zolpidem (AMBIEN CR) 12.5 MG CR tablet take 1 tablet at bedtime if needed  30 tablet  5  . zolpidem (AMBIEN CR) 12.5 MG CR tablet Take 1 tablet (12.5 mg total) by mouth at bedtime as needed for sleep.  30 tablet  5  . nystatin (MYCOSTATIN) 100000 UNIT/ML suspension Take 500,000 Units by mouth as needed. 5 ml to each side of mouth qid as instructed         PE: Blood pressure 140/96, pulse 78, temperature 96.6 F (35.9 C), height 5\' 9"  (1.753 m), weight 178 lb (80.74 kg), SpO2 97.00%. Gen:  Alert, well appearing.  Patient is oriented to person, place, time, and situation. Scalp with diffuse pinkish flakiness, rare area of more irritated-looking erythema.  No pustules or vesicles.  No large scaly plaques.  He has two small (1cm or less) soft lymph nodes palpable in left occipital region, nontender.  IMPRESSION AND PLAN:  Seborrheic dermatitis of scalp Gradually improving with his recent use of coal tar shampoo. Will rx ketoconazole 2% shampoo to see if he gets any better effect with this.   Recommended OTC nonsedating antihistamine prn for itching. Reassured pt regarding reactive occipital lymph nodes.     FOLLOW UP: has appt soon for cpe

## 2012-02-28 LAB — HEPATIC FUNCTION PANEL
ALT: 23 U/L (ref 0–53)
AST: 25 U/L (ref 0–37)
Albumin: 4.5 g/dL (ref 3.5–5.2)
Alkaline Phosphatase: 73 U/L (ref 39–117)
Bilirubin, Direct: 0.2 mg/dL (ref 0.0–0.3)
Total Bilirubin: 1.2 mg/dL (ref 0.3–1.2)
Total Protein: 6.7 g/dL (ref 6.0–8.3)

## 2012-02-28 LAB — RENAL FUNCTION PANEL
Albumin: 4.5 g/dL (ref 3.5–5.2)
BUN: 16 mg/dL (ref 6–23)
CO2: 24 mEq/L (ref 19–32)
Calcium: 9.4 mg/dL (ref 8.4–10.5)
Chloride: 106 mEq/L (ref 96–112)
Creatinine, Ser: 0.7 mg/dL (ref 0.4–1.5)
GFR: 116.89 mL/min (ref >60.00)
Glucose, Bld: 92 mg/dL (ref 70–99)
Phosphorus: 2.8 mg/dL (ref 2.3–4.6)
Potassium: 4.3 mEq/L (ref 3.5–5.1)
Sodium: 140 mEq/L (ref 135–145)

## 2012-02-28 LAB — LIPID PANEL
Cholesterol: 229 mg/dL — ABNORMAL HIGH (ref 0–200)
HDL: 48.6 mg/dL (ref >39.00)
Total CHOL/HDL Ratio: 5
Triglycerides: 288 mg/dL — ABNORMAL HIGH (ref 0.0–149.0)
VLDL: 57.6 mg/dL — ABNORMAL HIGH (ref 0.0–40.0)

## 2012-02-28 LAB — TSH: TSH: 2.83 u[IU]/mL (ref 0.35–5.50)

## 2012-02-28 LAB — LDL CHOLESTEROL, DIRECT: Direct LDL: 136.8 mg/dL

## 2012-03-05 ENCOUNTER — Encounter: Payer: Self-pay | Admitting: Family Medicine

## 2012-03-05 ENCOUNTER — Ambulatory Visit (INDEPENDENT_AMBULATORY_CARE_PROVIDER_SITE_OTHER): Payer: PRIVATE HEALTH INSURANCE | Admitting: Family Medicine

## 2012-03-05 VITALS — BP 150/93 | HR 81 | Temp 97.6°F | Ht 69.0 in | Wt 178.0 lb

## 2012-03-05 DIAGNOSIS — M5136 Other intervertebral disc degeneration, lumbar region: Secondary | ICD-10-CM

## 2012-03-05 DIAGNOSIS — M5137 Other intervertebral disc degeneration, lumbosacral region: Secondary | ICD-10-CM

## 2012-03-05 DIAGNOSIS — R030 Elevated blood-pressure reading, without diagnosis of hypertension: Secondary | ICD-10-CM

## 2012-03-05 DIAGNOSIS — Z0000 Encounter for general adult medical examination without abnormal findings: Secondary | ICD-10-CM | POA: Insufficient documentation

## 2012-03-05 DIAGNOSIS — M545 Low back pain, unspecified: Secondary | ICD-10-CM

## 2012-03-05 DIAGNOSIS — Z125 Encounter for screening for malignant neoplasm of prostate: Secondary | ICD-10-CM

## 2012-03-05 DIAGNOSIS — G8929 Other chronic pain: Secondary | ICD-10-CM

## 2012-03-05 DIAGNOSIS — Z86010 Personal history of colonic polyps: Secondary | ICD-10-CM

## 2012-03-05 LAB — PSA: PSA: 0.35 ng/mL (ref 0.10–4.00)

## 2012-03-05 MED ORDER — METAXALONE 800 MG PO TABS: ORAL_TABLET | ORAL | Status: DC

## 2012-03-05 MED ORDER — PANTOPRAZOLE SODIUM 40 MG PO TBEC: DELAYED_RELEASE_TABLET | ORAL | Status: DC

## 2012-03-05 NOTE — Patient Instructions (Signed)
Buy a home bp cuff at any pharmacy (upper arm cuff, not a wrist cuff).  Check your bp once daily at random times and write these down and bring to office for f/u visit in 1 month.  Health Maintenance, Males A healthy lifestyle and preventative care can promote health and wellness.  Maintain regular health, dental, and eye exams.   Eat a healthy diet. Foods like vegetables, fruits, whole grains, low-fat dairy products, and lean protein foods contain the nutrients you need without too many calories. Decrease your intake of foods high in solid fats, added sugars, and salt. Get information about a proper diet from your caregiver, if necessary.   Regular physical exercise is one of the most important things you can do for your health. Most adults should get at least 150 minutes of moderate-intensity exercise (any activity that increases your heart rate and causes you to sweat) each week. In addition, most adults need muscle-strengthening exercises on 2 or more days a week.    Maintain a healthy weight. The body mass index (BMI) is a screening tool to identify possible weight problems. It provides an estimate of body fat based on height and weight. Your caregiver can help determine your BMI, and can help you achieve or maintain a healthy weight. For adults 20 years and older:   A BMI below 18.5 is considered underweight.   A BMI of 18.5 to 24.9 is normal.   A BMI of 25 to 29.9 is considered overweight.   A BMI of 30 and above is considered obese.   Maintain normal blood lipids and cholesterol by exercising and minimizing your intake of saturated fat. Eat a balanced diet with plenty of fruits and vegetables. Blood tests for lipids and cholesterol should begin at age 84 and be repeated every 5 years. If your lipid or cholesterol levels are high, you are over 50, or you are a high risk for heart disease, you may need your cholesterol levels checked more frequently.Ongoing high lipid and cholesterol  levels should be treated with medicines, if diet and exercise are not effective.   If you smoke, find out from your caregiver how to quit. If you do not use tobacco, do not start.   If you choose to drink alcohol, do not exceed 2 drinks per day. One drink is considered to be 12 ounces (355 mL) of beer, 5 ounces (148 mL) of wine, or 1.5 ounces (44 mL) of liquor.   Avoid use of street drugs. Do not share needles with anyone. Ask for help if you need support or instructions about stopping the use of drugs.   High blood pressure causes heart disease and increases the risk of stroke. Blood pressure should be checked at least every 1 to 2 years. Ongoing high blood pressure should be treated with medicines if weight loss and exercise are not effective.   If you are 69 to 59 years old, ask your caregiver if you should take aspirin to prevent heart disease.   Diabetes screening involves taking a blood sample to check your fasting blood sugar level. This should be done once every 3 years, after age 5, if you are within normal weight and without risk factors for diabetes. Testing should be considered at a younger age or be carried out more frequently if you are overweight and have at least 1 risk factor for diabetes.   Colorectal cancer can be detected and often prevented. Most routine colorectal cancer screening begins at the age of  50 and continues through age 33. However, your caregiver may recommend screening at an earlier age if you have risk factors for colon cancer. On a yearly basis, your caregiver may provide home test kits to check for hidden blood in the stool. Use of a small camera at the end of a tube, to directly examine the colon (sigmoidoscopy or colonoscopy), can detect the earliest forms of colorectal cancer. Talk to your caregiver about this at age 4, when routine screening begins. Direct examination of the colon should be repeated every 5 to 10 years through age 28, unless early forms of  pre-cancerous polyps or small growths are found.   Hepatitis C blood testing is recommended for all people born from [birth year] through 1963 and any individual with known risks for hepatitis C.   Healthy men should no longer receive prostate-specific antigen (PSA) blood tests as part of routine cancer screening. Consult with your caregiver about prostate cancer screening.   Testicular cancer screening is not recommended for adolescents or adult males who have no symptoms. Screening includes self-exam, caregiver exam, and other screening tests. Consult with your caregiver about any symptoms you have or any concerns you have about testicular cancer.   Practice safe sex. Use condoms and avoid high-risk sexual practices to reduce the spread of sexually transmitted infections (STIs).   Use sunscreen with a sun protection factor (SPF) of 30 or greater. Apply sunscreen liberally and repeatedly throughout the day. You should seek shade when your shadow is shorter than you. Protect yourself by wearing long sleeves, pants, a wide-brimmed hat, and sunglasses year round, whenever you are outdoors.   Notify your caregiver of new moles or changes in moles, especially if there is a change in shape or color. Also notify your caregiver if a mole is larger than the size of a pencil eraser.   A one-time screening for abdominal aortic aneurysm (AAA) and surgical repair of large AAAs by sound wave imaging (ultrasonography) is recommended for ages 64 to 79 years who are current or former smokers.   Stay current with your immunizations.  Document Released: 12/30/2007 Document Revised: 06/22/2011 Document Reviewed: 11/28/2010 [facility] Patient Information 2012 [place], [place].

## 2012-03-05 NOTE — Assessment & Plan Note (Signed)
Reviewed age and gender appropriate health maintenance issues (prudent diet, regular exercise, health risks of tobacco and excessive alcohol, use of seatbelts, fire alarms in home, use of sunscreen).  Also reviewed age and gender appropriate health screening as well as vaccine recommendations. No vaccines due. PSA level ordered today, DRE normal today. Reviewed recent blood work in detail today: borderline hyperlipidemia (mixed).   Emphasized importance of lifestyle mod today, will recheck FLP in 75mo.

## 2012-03-05 NOTE — Assessment & Plan Note (Signed)
He was encouraged to call [facility] today to arrange appropriate f/u for this.

## 2012-03-05 NOTE — Assessment & Plan Note (Signed)
Emphasized need to do home monitoring: recommended pt purchase his own upper arm bp cuff for home bp checks daily and bring these numbers to his f/u visit in 1 mo.

## 2012-03-05 NOTE — Assessment & Plan Note (Signed)
Getting more constant.  No radiculopathy symptoms. I recommended he see a neurosurgeon for consultation.  I did not order any imaging today---will let neurosurgery decide what is best to do regarding this.  He'll continue ibuprofen and I advised him to try tylenol to see if he gets similar relief so he can try to avoid so much ibuprofen. He'll continue with prn skelaxin as well.

## 2012-03-05 NOTE — Progress Notes (Signed)
Office Note 03/05/2012  CC:  Chief Complaint  Patient presents with  . Annual Exam    no problems    HPI:  Keith Sanders is a 61 y.o. White male who goes by Keith Sanders and he is here for annual health maintenance exam. Was here a week ago and got fasting health panel, also I saw him and gave him ketoconazole shampoo for his scalp seb derm.   He c/o ongoing LBP for 20+ yrs.  Describes some periods of worsening about 6 yrs ago and ever since a bad strain 2 yrs ago--waxes and wanes.  Last 2 mo's has been more persistent, rates it 5/10 intensity, says it requires his back brace daily the last 2 mo.  Is taking ibuprofen 800mg  up to tid.  This helps his pain quite a bit.  The pain does not radiate.  He occ gets some tingling down left leg.  He did PT in the distant past.     Past Medical History  Diagnosis Date  . Chronic insomnia   . Chronic lumbar pain     DDD; ibuprofen, occasional muscle relaxers.  MRI L-spine 2007 showed some bulging discs with nerve impingement.  Has never had back injection or surgery.  . Contact dermatitis and eczema   . PUD (peptic ulcer disease)   . Diverticulitis   . Anxiety   . Gout   . History of adenomatous polyp of colon 2005  . Elevated blood pressure reading without diagnosis of hypertension     Past Surgical History  Procedure Date  . Colonoscopy w/ polypectomy 2005    Dr. Arlyce Dice    Family History  Problem Relation Age of Onset  . Heart attack Father 46  . Heart disease Father     MI age 66  . Heart disease Other     History   Social History  . Marital Status: Married    Spouse Name: N/A    Number of Children: N/A  . Years of Education: N/A   Occupational History  . Not on file.   Social History Main Topics  . Smoking status: Never Smoker   . Smokeless tobacco: Never Used  . Alcohol Use: Yes  . Drug Use: No  . Sexually Active: Yes   Other Topics Concern  . 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    Outpatient Prescriptions Prior to Visit  Medication Sig Dispense Refill  . ALPRAZolam (XANAX) 0.5 MG tablet take 1 tablet by mouth four times a day if needed for STRESS  120 tablet  5  . Ascorbic Acid (VITAMIN C) 1000 MG tablet Take 1,000 mg by mouth daily.        Marland Kitchen aspirin 81 MG tablet Take 81 mg by mouth daily.        . fluticasone (FLONASE) 50 MCG/ACT nasal spray instill 2 sprays into each nostril once daily if needed for allergies  16 g  2  . ibuprofen (ADVIL,MOTRIN) 800 MG tablet take 1 tablet by mouth three times a day  90 tablet  5  . ketoconazole (NIZORAL) 2 % shampoo Apply to scalp every other day as needed for scalp seborrheic dermatitis  120 mL  2  . Multiple Vitamin (MULTIVITAMIN) tablet Take 1 tablet by mouth daily.        . Omega-3 Fatty Acids (FISH OIL) 875 MG CAPS  Take 2 capsules by mouth daily.        Marland Kitchen triamcinolone cream (KENALOG) 0.1 % apply to affected area twice a day  45 g  5  . zolpidem (AMBIEN CR) 12.5 MG CR tablet take 1 tablet at bedtime if needed  30 tablet  5  . metaxalone (SKELAXIN) 800 MG tablet Take 800 mg by mouth 4 (four) times daily.        . pantoprazole (PROTONIX) 40 MG tablet take 1 tablet by mouth once daily  30 tablet  5  . zolpidem (AMBIEN CR) 12.5 MG CR tablet Take 1 tablet (12.5 mg total) by mouth at bedtime as needed for sleep.  30 tablet  5    No Known Allergies  ROS Review of Systems  Constitutional: Negative for fever, chills, appetite change and fatigue.  HENT: Negative for ear pain, congestion, sore throat, neck stiffness and dental problem.   Eyes: Negative for discharge, redness and visual disturbance.  Respiratory: Negative for cough, chest tightness, shortness of breath and wheezing.   Cardiovascular: Negative for chest pain, palpitations and leg swelling.  Gastrointestinal: Negative for nausea, vomiting, abdominal pain,  diarrhea and blood in stool.  Genitourinary: Negative for dysuria, urgency, frequency, hematuria, flank pain and difficulty urinating.  Musculoskeletal: Positive for back pain. Negative for myalgias, joint swelling and arthralgias.  Skin: Positive for rash (scalp--recent seb derm). Negative for pallor.  Neurological: Negative for dizziness, speech difficulty, weakness and headaches.  Hematological: Negative for adenopathy. Does not bruise/bleed easily.  Psychiatric/Behavioral: Negative for confusion and disturbed wake/sleep cycle. The patient is not nervous/anxious.     PE; Blood pressure 150/93, pulse 81, temperature 97.6 F (36.4 C), temperature source Temporal, height 5\' 9"  (1.753 m), weight 178 lb (80.74 kg). Gen: Alert, well appearing.  Patient is oriented to person, place, time, and situation. AFFECT: pleasant, lucid thought and speech. ENT: Ears: EACs clear, normal epithelium.  TMs with good light reflex and landmarks bilaterally.  Eyes: no injection, icteris, swelling, or exudate.  EOMI, PERRLA. Nose: no drainage or turbinate edema/swelling.  No injection or focal lesion.  Mouth: lips without lesion/swelling.  Oral mucosa pink and moist.  Dentition intact and without obvious caries or gingival swelling.  Oropharynx without erythema, exudate, or swelling.  Neck - No masses or thyromegaly or limitation in range of motion CV: RRR, no m/r/g.   LUNGS: CTA bilat, nonlabored resps, good aeration in all lung fields. ABD: soft, NT, ND, BS normal.  No hepatospenomegaly or mass.  No bruits. EXT: no clubbing, cyanosis, or edema.  Genitals normal; both testes normal without tenderness, masses, hydroceles, varicoceles, erythema or swelling. Shaft normal, circumcised, meatus normal without discharge. No inguinal hernia noted. No inguinal lymphadenopathy. Rectal exam: negative without mass, lesions or tenderness, PROSTATE EXAM: smooth and symmetric without nodules or tenderness.  Pertinent labs:    Lab Results  Component Value Date   TSH 2.83 02/27/2012   Lab Results  Component Value Date   WBC 6.6 02/27/2012   HGB 15.5 02/27/2012   HCT 45.7 02/27/2012   MCV 99.1 02/27/2012   PLT 233.0 02/27/2012   Lab Results  Component Value Date   CREATININE 0.7 02/27/2012   BUN 16 02/27/2012   NA 140 02/27/2012   K 4.3 02/27/2012   CL 106 02/27/2012   CO2 24 02/27/2012   Lab Results  Component Value Date   ALT 23 02/27/2012   AST 25 02/27/2012   ALKPHOS 73 02/27/2012   BILITOT 1.2 02/27/2012  Lab Results  Component Value Date   CHOL 229* 02/27/2012   Lab Results  Component Value Date   HDL 48.60 02/27/2012   No results found for this basename: [facility]   Lab Results  Component Value Date   TRIG 288.0* 02/27/2012   Lab Results  Component Value Date   CHOLHDL 5 02/27/2012   Lab Results  Component Value Date   PSA 0.33 03/03/2009   PSA 0.34 02/20/2008   PSA 0.34 02/18/2007       ASSESSMENT AND PLAN:   Health maintenance examination Reviewed age and gender appropriate health maintenance issues (prudent diet, regular exercise, health risks of tobacco and excessive alcohol, use of seatbelts, fire alarms in home, use of sunscreen).  Also reviewed age and gender appropriate health screening as well as vaccine recommendations. No vaccines due. PSA level ordered today, DRE normal today. Reviewed recent blood work in detail today: borderline hyperlipidemia (mixed).   Emphasized importance of lifestyle mod today, will recheck FLP in 39mo.  History of adenomatous polyp of colon He was encouraged to call [facility] today to arrange appropriate f/u for this.  Elevated blood pressure reading without diagnosis of hypertension Emphasized need to do home monitoring: recommended pt purchase his own upper arm bp cuff for home bp checks daily and bring these numbers to his f/u visit in 1 mo.      Chronic low back pain Getting more constant.  No radiculopathy symptoms. I recommended he see a  neurosurgeon for consultation.  I did not order any imaging today---will let neurosurgery decide what is best to do regarding this.  He'll continue ibuprofen and I advised him to try tylenol to see if he gets similar relief so he can try to avoid so much ibuprofen. He'll continue with prn skelaxin as well.    FOLLOW UP:  Return in about 1 month (around 04/05/2012) for f/u elevated bp's.

## 2012-04-03 ENCOUNTER — Ambulatory Visit (INDEPENDENT_AMBULATORY_CARE_PROVIDER_SITE_OTHER): Payer: PRIVATE HEALTH INSURANCE | Admitting: Family Medicine

## 2012-04-03 ENCOUNTER — Encounter: Payer: Self-pay | Admitting: Family Medicine

## 2012-04-03 VITALS — BP 153/97 | HR 73 | Ht 69.0 in | Wt 178.0 lb

## 2012-04-03 DIAGNOSIS — I10 Essential (primary) hypertension: Secondary | ICD-10-CM

## 2012-04-03 NOTE — Assessment & Plan Note (Signed)
Stage 1.  Discussed dx, general measures (DASH diet, exercise, stress reduction, avoidance of NSAIDs and OTC decongestants) to lower bp. He wants to avoid bp meds at all costs, but he understands that if bp gradually rises or if TLC does not help things then we'll get him started on med.  F/u 4-6 mo to recheck this prob and repeat metabolic panel.

## 2012-04-03 NOTE — Progress Notes (Signed)
OFFICE NOTE  04/03/2012  CC:  Chief Complaint  Patient presents with  . Follow-up    HTN; brought readings from last 30 days; declines flu shot     HPI: Patient is a 61 y.o. Caucasian male who is here for 1 mo f/u elevated bp w/out dx of HTN. Reviewed bp log from the last month: avg systolic high 130s, avg diastolic low 90s, avg pulse in the 80s. Very stressed at work, has chronic back pain.  Has been taking high doses of NSAIDs for years for back pain.  Pertinent PMH:  Past Medical History  Diagnosis Date  . Chronic insomnia   . Chronic lumbar pain     DDD; ibuprofen, occasional muscle relaxers.  MRI L-spine 2007 showed some bulging discs with nerve impingement.  Has never had back injection or surgery.  . Contact dermatitis and eczema   . PUD (peptic ulcer disease)   . Diverticulitis   . Anxiety   . Gout   . History of adenomatous polyp of colon 2005  . Elevated blood pressure reading without diagnosis of hypertension     MEDS:  Outpatient Prescriptions Prior to Visit  Medication Sig Dispense Refill  . ALPRAZolam (XANAX) 0.5 MG tablet take 1 tablet by mouth four times a day if needed for STRESS  120 tablet  5  . Ascorbic Acid (VITAMIN C) 1000 MG tablet Take 1,000 mg by mouth daily.        Marland Kitchen aspirin 81 MG tablet Take 81 mg by mouth daily.        . fluticasone (FLONASE) 50 MCG/ACT nasal spray instill 2 sprays into each nostril once daily if needed for allergies  16 g  2  . ibuprofen (ADVIL,MOTRIN) 800 MG tablet take 1 tablet by mouth three times a day  90 tablet  5  . ketoconazole (NIZORAL) 2 % shampoo Apply to scalp every other day as needed for scalp seborrheic dermatitis  120 mL  2  . metaxalone (SKELAXIN) 800 MG tablet 1/2-1 tab po tid prn  90 tablet  1  . Multiple Vitamin (MULTIVITAMIN) tablet Take 1 tablet by mouth daily.        . Omega-3 Fatty Acids (FISH OIL) 875 MG CAPS Take 2 capsules by mouth daily.        . pantoprazole (PROTONIX) 40 MG tablet 1 tab po qAM  30  tablet  10  . triamcinolone cream (KENALOG) 0.1 % apply to affected area twice a day  45 g  5  . zolpidem (AMBIEN CR) 12.5 MG CR tablet take 1 tablet at bedtime if needed  30 tablet  5    PE: Blood pressure 153/97, pulse 73, height 5\' 9"  (1.753 m), weight 178 lb (80.74 kg). Gen: Alert, well appearing.  Patient is oriented to person, place, time, and situation. AFFECT: pleasant, lucid thought and speech. No further exam today.  IMPRESSION AND PLAN:  HTN (hypertension), benign Stage 1.  Discussed dx, general measures (DASH diet, exercise, stress reduction, avoidance of NSAIDs and OTC decongestants) to lower bp. He wants to avoid bp meds at all costs, but he understands that if bp gradually rises or if TLC does not help things then we'll get him started on med.  F/u 4-6 mo to recheck this prob and repeat metabolic panel.   He has f/u with neurosurgeon regarding his back this week (had recent L spine MRI with their office and doesn't know result).  He declined the flu shot today.  FOLLOW  UP: 4-6 mo (recheck bp and creatinine).

## 2012-04-03 NOTE — Patient Instructions (Addendum)
Limit use of nonsteroidal anti-inflammitory meds as much as possible. Avoid OTC med with "decongestant" on the labeling. Follow DASH diet. Monitor blood pressure 2 times per week and write this info down. Reminder: goal bp is <140/90.

## 2012-04-04 ENCOUNTER — Ambulatory Visit: Payer: PRIVATE HEALTH INSURANCE | Admitting: Family Medicine

## 2012-04-25 ENCOUNTER — Other Ambulatory Visit: Payer: Self-pay | Admitting: Family Medicine

## 2012-04-25 NOTE — Telephone Encounter (Signed)
eScribe request for refill on FLONASE  Last filled - 01/05/12, #16G X 2 Last seen on - 04/03/12 Follow up - 08/31/11 RX sent

## 2012-04-29 ENCOUNTER — Other Ambulatory Visit: Payer: Self-pay

## 2012-04-29 NOTE — Telephone Encounter (Signed)
Please advise refill? Last alprazolam refill was 10-24-11 quantity 120 with 5 refills

## 2012-04-30 MED ORDER — ALPRAZOLAM 0.5 MG PO TABS: ORAL_TABLET | ORAL | Status: DC

## 2012-07-10 ENCOUNTER — Other Ambulatory Visit: Payer: Self-pay | Admitting: Family Medicine

## 2012-07-11 ENCOUNTER — Telehealth: Payer: Self-pay | Admitting: *Deleted

## 2012-07-11 NOTE — Telephone Encounter (Signed)
Left message on VM of Rx for zolpidem called to pharmacy.

## 2012-07-11 NOTE — Telephone Encounter (Signed)
Patient request refill on zolpidem. Last refill 01/2012. Next office visit 08/2012 with Dr. Milinda Cave. Please advise.

## 2012-08-06 ENCOUNTER — Ambulatory Visit (INDEPENDENT_AMBULATORY_CARE_PROVIDER_SITE_OTHER): Payer: PRIVATE HEALTH INSURANCE | Admitting: Family Medicine

## 2012-08-06 ENCOUNTER — Encounter: Payer: Self-pay | Admitting: Family Medicine

## 2012-08-06 VITALS — BP 140/90 | HR 94 | Temp 98.7°F | Ht 69.0 in | Wt 181.0 lb

## 2012-08-06 DIAGNOSIS — J40 Bronchitis, not specified as acute or chronic: Secondary | ICD-10-CM

## 2012-08-06 DIAGNOSIS — I10 Essential (primary) hypertension: Secondary | ICD-10-CM

## 2012-08-06 MED ORDER — GUAIFENESIN ER 600 MG PO TB12: 600.0000 mg | ORAL_TABLET | Freq: Two times a day (BID) | ORAL | Status: DC

## 2012-08-06 MED ORDER — PROBIOTIC PO CAPS: ORAL_CAPSULE | ORAL | Status: DC

## 2012-08-06 MED ORDER — HYDROCOD POLST-CHLORPHEN POLST 10-8 MG/5ML PO LQCR: 5.0000 mL | Freq: Every evening | ORAL | Status: DC | PRN

## 2012-08-06 MED ORDER — AZITHROMYCIN 250 MG PO TABS: ORAL_TABLET | ORAL | Status: DC

## 2012-08-06 NOTE — Patient Instructions (Addendum)

## 2012-08-07 ENCOUNTER — Encounter: Payer: Self-pay | Admitting: Family Medicine

## 2012-08-11 ENCOUNTER — Encounter: Payer: Self-pay | Admitting: Family Medicine

## 2012-08-11 DIAGNOSIS — Z8709 Personal history of other diseases of the respiratory system: Secondary | ICD-10-CM

## 2012-08-11 HISTORY — DX: Personal history of other diseases of the respiratory system: Z87.09

## 2012-08-11 NOTE — Assessment & Plan Note (Signed)
Mild elevation with acute illness, no changes 

## 2012-08-11 NOTE — Progress Notes (Signed)
Patient ID: Keith Sanders, male   DOB: 08/20/1950, 62 y.o.   MRN: [id number] DOVBER ERNEST [id number] 11-18-1950 08/11/2012      Progress Note-Follow Up  Subjective  Chief Complaint  Chief Complaint  Patient presents with  . flu like symptoms    diarhea, cough (Thurs, Fri, Sat), fever X 3 days    HPI  Patient is a 62 year old Caucasian male who is in today with a roughly 5 day history of worsening flulike symptoms. She's been struggling with fevers, chills. He's had a headache for the last 2 days. He states green rhinorrhea. His malaise, myalgias, fatigue. He notes anorexia and several loose bowel movements daily. Says he coughs so that his chest begins to hurt and he has shortness of breath. He's tried many medications over-the-counter including NyQuil, Dimetapp and Mucinex with minimal improvement. No ear pain or throat pain. Ibuprofen helps to blunt his fever temporarily but then it returns.  Past Medical History  Diagnosis Date  . Chronic insomnia   . Chronic lumbar pain     DDD; ibuprofen, occasional muscle relaxers.  MRI L-spine 2007 showed some bulging discs with nerve impingement.  Has never had back injection or surgery.  . Contact dermatitis and eczema   . PUD (peptic ulcer disease)   . Diverticulitis   . Anxiety   . Gout   . History of adenomatous polyp of colon 2005  . Elevated blood pressure reading without diagnosis of hypertension   . Bronchitis 08/11/2012    Past Surgical History  Procedure Date  . Colonoscopy w/ polypectomy 2005    Dr. Arlyce Dice    Family History  Problem Relation Age of Onset  . Heart attack Father 68  . Heart disease Father     MI age 72  . Heart disease Other     History   Social History  . Marital Status: Married    Spouse Name: N/A    Number of Children: N/A  . Years of Education: N/A   Occupational History  . Not on file.   Social History Main Topics  . Smoking status: Never Smoker   . Smokeless tobacco: Never Used  .  Alcohol Use: Yes  . Drug Use: No  . Sexually Active: Yes   Other Topics Concern  . Not on file   Social History Narrative   Married, 3 grown children.Works in Counselling psychologist Financial trader).Lived in [place] area all his life.No T/A/Ds.Walks 3-4 times per day.    Current Outpatient Prescriptions on File Prior to Visit  Medication Sig Dispense Refill  . ALPRAZolam (XANAX) 0.5 MG tablet 1 tab po qid prn stress  120 tablet  5  . Ascorbic Acid (VITAMIN C) 1000 MG tablet Take 1,000 mg by mouth daily.        Marland Kitchen aspirin 81 MG tablet Take 81 mg by mouth daily.        . fluticasone (FLONASE) 50 MCG/ACT nasal spray instill 2 sprays into each nostril once daily if needed for allergies  16 g  2  . ibuprofen (ADVIL,MOTRIN) 800 MG tablet take 1 tablet by mouth three times a day  90 tablet  5  . ketoconazole (NIZORAL) 2 % shampoo Apply to scalp every other day as needed for scalp seborrheic dermatitis  120 mL  2  . metaxalone (SKELAXIN) 800 MG tablet 1/2-1 tab po tid prn  90 tablet  1  . Multiple Vitamin (MULTIVITAMIN) tablet Take 1 tablet by mouth daily.        Marland Kitchen  Omega-3 Fatty Acids (FISH OIL) 875 MG CAPS Take 2 capsules by mouth daily.        . pantoprazole (PROTONIX) 40 MG tablet 1 tab po qAM  30 tablet  10  . triamcinolone cream (KENALOG) 0.1 % apply to affected area twice a day  45 g  5  . zolpidem (AMBIEN CR) 12.5 MG CR tablet take 1 tablet at bedtime if needed  30 tablet  5    No Known Allergies  Review of Systems  Review of Systems  Constitutional: Positive for fever, chills and malaise/fatigue.  HENT: Positive for congestion.   Eyes: Negative for discharge.  Respiratory: Positive for cough, sputum production and shortness of breath.   Cardiovascular: Positive for palpitations. Negative for chest pain and leg swelling.  Gastrointestinal: Negative for nausea, abdominal pain and diarrhea.  Genitourinary: Negative for dysuria.  Musculoskeletal: Positive for myalgias. Negative for falls.  Skin:  Negative for rash.  Neurological: Negative for loss of consciousness and headaches.  Endo/Heme/Allergies: Negative for polydipsia.  Psychiatric/Behavioral: Negative for depression and suicidal ideas. The patient is not nervous/anxious and does not have insomnia.     Objective  BP 140/90  Pulse 94  Temp 98.7 F (37.1 C) (Oral)  Ht 5\' 9"  (1.753 m)  Wt 181 lb (82.101 kg)  BMI 26.73 kg/m2  SpO2 96%  Physical Exam  Physical Exam  Constitutional: He is oriented to person, place, and time and well-developed, well-nourished, and in no distress. No distress.  HENT:  Head: Normocephalic and atraumatic.  Eyes: Conjunctivae normal are normal.  Neck: Neck supple. No thyromegaly present.  Cardiovascular: Normal rate, regular rhythm and normal heart sounds.   No murmur heard. Pulmonary/Chest: Effort normal. No respiratory distress.       Rhonchi b/l bases  Abdominal: He exhibits no distension and no mass. There is no tenderness.  Musculoskeletal: He exhibits no edema.  Neurological: He is alert and oriented to person, place, and time.  Skin: Skin is warm.  Psychiatric: Memory, affect and judgment normal.    Lab Results  Component Value Date   TSH 2.83 02/27/2012   Lab Results  Component Value Date   WBC 6.6 02/27/2012   HGB 15.5 02/27/2012   HCT 45.7 02/27/2012   MCV 99.1 02/27/2012   PLT 233.0 02/27/2012   Lab Results  Component Value Date   CREATININE 0.7 02/27/2012   BUN 16 02/27/2012   NA 140 02/27/2012   K 4.3 02/27/2012   CL 106 02/27/2012   CO2 24 02/27/2012   Lab Results  Component Value Date   ALT 23 02/27/2012   AST 25 02/27/2012   ALKPHOS 73 02/27/2012   BILITOT 1.2 02/27/2012   Lab Results  Component Value Date   CHOL 229* 02/27/2012   Lab Results  Component Value Date   HDL 48.60 02/27/2012   No results found for this basename: LDLCALC   Lab Results  Component Value Date   TRIG 288.0* 02/27/2012   Lab Results  Component Value Date   CHOLHDL 5 02/27/2012      Assessment & Plan  HTN (hypertension), benign Mild elevation with acute illness, no changes  Bronchitis Azithromycin, Tussionex, increase rest and hydration

## 2012-08-11 NOTE — Assessment & Plan Note (Signed)
Azithromycin, Tussionex, increase rest and hydration

## 2012-08-23 ENCOUNTER — Other Ambulatory Visit: Payer: Self-pay | Admitting: Family Medicine

## 2012-08-23 NOTE — Telephone Encounter (Signed)
eScribe request for refill on FLUTICASONE Last filled - 04/25/12, 1 X 2 Last seen on - 08/06/12 Follow up - 08/28/12 RX sent

## 2012-08-28 ENCOUNTER — Ambulatory Visit: Payer: PRIVATE HEALTH INSURANCE | Admitting: Family Medicine

## 2012-09-04 ENCOUNTER — Encounter: Payer: Self-pay | Admitting: Family Medicine

## 2012-09-04 ENCOUNTER — Ambulatory Visit (INDEPENDENT_AMBULATORY_CARE_PROVIDER_SITE_OTHER): Payer: PRIVATE HEALTH INSURANCE | Admitting: Family Medicine

## 2012-09-04 VITALS — BP 124/76 | HR 80 | Ht 69.0 in | Wt 179.0 lb

## 2012-09-04 DIAGNOSIS — R030 Elevated blood-pressure reading, without diagnosis of hypertension: Secondary | ICD-10-CM

## 2012-09-04 DIAGNOSIS — Z86010 Personal history of colonic polyps: Secondary | ICD-10-CM

## 2012-09-04 NOTE — Progress Notes (Signed)
OFFICE NOTE  09/04/2012  CC:  Chief Complaint  Patient presents with  . Follow-up    HTN     HPI: Patient is a 62 y.o. Caucasian male who is here for f/u elevated bp w/out dx of HTN. Has been monitoring bp/pulse at home: has 34 readings with him today and avg syst 130, avg diast 85, pulse 80s-90s.  Feeling good.  Taking ibuprofen 800mg  bid-tid for his chronic LBP---not having much of this lately.   Pertinent PMH:  Past Medical History  Diagnosis Date  . Chronic insomnia   . Chronic lumbar pain     DDD; ibuprofen, occasional muscle relaxers.  MRI L-spine 2007 showed some bulging discs with nerve impingement.  Has never had back injection or surgery.  . Contact dermatitis and eczema   . PUD (peptic ulcer disease)   . Diverticulitis   . Anxiety   . Gout   . History of adenomatous polyp of colon 2005  . Elevated blood pressure reading without diagnosis of hypertension   . Bronchitis 08/11/2012   Past surgical, social, and family history reviewed and no changes noted since last office visit.  MEDS:  Outpatient Prescriptions Prior to Visit  Medication Sig Dispense Refill  . ALPRAZolam (XANAX) 0.5 MG tablet 1 tab po qid prn stress  120 tablet  5  . Ascorbic Acid (VITAMIN C) 1000 MG tablet Take 1,000 mg by mouth daily.        Marland Kitchen aspirin 81 MG tablet Take 81 mg by mouth daily.        . fluticasone (FLONASE) 50 MCG/ACT nasal spray instill 2 sprays into each nostril once daily if needed for allergies  16 g  5  . ibuprofen (ADVIL,MOTRIN) 800 MG tablet take 1 tablet by mouth three times a day  90 tablet  5  . ketoconazole (NIZORAL) 2 % shampoo Apply to scalp every other day as needed for scalp seborrheic dermatitis  120 mL  2  . metaxalone (SKELAXIN) 800 MG tablet 1/2-1 tab po tid prn  90 tablet  1  . Multiple Vitamin (MULTIVITAMIN) tablet Take 1 tablet by mouth daily.        . Omega-3 Fatty Acids (FISH OIL) 875 MG CAPS Take 2 capsules by mouth daily.        . pantoprazole (PROTONIX) 40  MG tablet 1 tab po qAM  30 tablet  10  . triamcinolone cream (KENALOG) 0.1 % apply to affected area twice a day  45 g  5  . zolpidem (AMBIEN CR) 12.5 MG CR tablet take 1 tablet at bedtime if needed  30 tablet  5  . azithromycin (ZITHROMAX) 250 MG tablet 2 tabs po once and then 1 tab po daily  6 tablet  0  . benzonatate (TESSALON) 100 MG capsule Take 100 mg by mouth 3 (three) times daily as needed.      . chlorpheniramine-HYDROcodone (TUSSIONEX PENNKINETIC ER) 10-8 MG/5ML LQCR Take 5 mLs by mouth at bedtime as needed.  140 mL  1  . guaiFENesin (MUCINEX) 600 MG 12 hr tablet Take 1 tablet (600 mg total) by mouth 2 (two) times daily. X 10 days      . PROBIOTIC CAPS Digest[employer] Ad[employer]age by [employer]       No facility-administered medications prior to visit.    PE: Blood pressure 124/76, pulse 80, height 5\' 9"  (1.753 m), weight 179 lb (81.194 kg). Gen: Alert, well appearing.  Patient is oriented to person, place, time, and situation. Neck -  No masses or thyromegaly or limitation in range of motion CV: RRR, no m/r/g.   LUNGS: CTA bilat, nonlabored resps, good aeration in all lung fields.   IMPRESSION AND PLAN:  Elevated blood pressure reading without diagnosis of hypertension Home monitoring shows avg syst and diast wnl. Avoid NSAIDs and OTC decongestants if possible. Signs/symptoms to call or return for were reviewed and pt expressed understanding.   History of adenomatous polyp of colon Notified pt of need for repeat colonoscopy.  Gave him [facility]'s phone number so he can set up his own f/u colonoscopy with Dr. Arlyce Dice, who did his initial one in 2005.   An After Visit Summary was printed and given to the patient.  FOLLOW UP: 6 mo--CPE, fasting labs

## 2012-09-04 NOTE — Assessment & Plan Note (Signed)
Home monitoring shows avg syst and diast wnl. Avoid NSAIDs and OTC decongestants if possible. Signs/symptoms to call or return for were reviewed and pt expressed understanding.

## 2012-09-04 NOTE — Assessment & Plan Note (Signed)
Notified pt of need for repeat colonoscopy.  Gave him [facility]'s phone number so he can set up his own f/u colonoscopy with Dr. Arlyce Dice, who did his initial one in 2005.

## 2012-09-04 NOTE — Patient Instructions (Signed)
[facility]: [phone] (ask for f/u with Dr. Arlyce Dice for hx of colon polyp in 2005).

## 2012-10-18 ENCOUNTER — Other Ambulatory Visit: Payer: Self-pay | Admitting: *Deleted

## 2012-10-18 MED ORDER — IBUPROFEN 800 MG PO TABS: 800.0000 mg | ORAL_TABLET | Freq: Three times a day (TID) | ORAL | Status: DC | PRN

## 2012-10-18 NOTE — Telephone Encounter (Signed)
Faxed refill request received from pharmacy for IBUPROFEN 800MG  , TID Last filled by MD on 12/21/11, #90 X 5 Last seen on 09/04/12 Follow up 6 MONTHS Advised at 2/19 visit to avoid NSAIDS.  Please advise refills.

## 2012-10-24 ENCOUNTER — Telehealth: Payer: Self-pay | Admitting: Family Medicine

## 2012-10-24 NOTE — Telephone Encounter (Signed)
Patient Information:  Caller Name: Khalel  Phone: [phone]  Patient: Keith Sanders, Keith Sanders  Gender: Male  DOB: Nov 28, 1950  Age: 62 Years  PCP: [facility])  Office Follow Up:  Does the office need to follow up with this patient?: Yes  Instructions For The Office: Please advise, pt requesting rx  RN Note:  Pt believes that he has Diverticulitis, Pt is requesting that rx be called in for him.  Symptoms  Reason For Call & Symptoms: Abd pain onset 2 weeks ago with 'stomach flu', sxs have changed over the last 3 days with Diarrhea x 10-15 day.  No nausea, Abd diffuse pain, 'uneasy' feeling in stomach.  Afebrile.  Reviewed Health History In EMR: Yes  Reviewed Medications In EMR: Yes  Reviewed Allergies In EMR: Yes  Reviewed Surgeries / Procedures: Yes  Date of Onset of Symptoms: 09/08/2012  Guideline(s) Used:  Abdominal Pain - Male  Disposition Per Guideline:   See Today in Office  Reason For Disposition Reached:   Mild pain that comes and goes (cramps) lasts > 24 hours  Advice Given:  Call Back If:  Abdominal pain is constant and present for more than 2 hours  Abdominal pains come and go and are present for more than 24 hours  You become worse.  Patient Refused Recommendation:  Patient Requests Prescription  Pt requesting rx

## 2012-10-24 NOTE — Telephone Encounter (Signed)
Pt needs to be seen in office

## 2012-10-24 NOTE — Telephone Encounter (Signed)
Please advise 

## 2012-10-25 MED ORDER — CIPROFLOXACIN HCL 500 MG PO TABS: 500.0000 mg | ORAL_TABLET | Freq: Two times a day (BID) | ORAL | Status: AC

## 2012-10-25 MED ORDER — METRONIDAZOLE 500 MG PO TABS: 500.0000 mg | ORAL_TABLET | Freq: Three times a day (TID) | ORAL | Status: AC

## 2012-10-25 NOTE — Telephone Encounter (Signed)
OK to call cipro 500mg  bid x 10d, #20 and flagyl 500mg  tid x 10d, #30---no RFs.  Try to take with food. Generics ok. Needs to come in early next week if not improving.-thx

## 2012-10-25 NOTE — Telephone Encounter (Signed)
Pt is still having diarrhea today.  Same symptoms as previous episode a few years ago--note in [place].  Pt is out of town today and unavailable tomorrow so he can not [facility].  Took Imodium today and did not eat so BM's are better.  Still no fever, no nausea/vomiting.  Drinking Gatorade and tolerating OK.  Pt declined making an appt for Monday at this time.  He will call back on Monday if he needs to be seen.

## 2012-10-25 NOTE — Telephone Encounter (Signed)
Pt notified and is agreeable.  Alcohol warnings given for flagyl.

## 2012-12-18 ENCOUNTER — Other Ambulatory Visit: Payer: Self-pay | Admitting: Family Medicine

## 2012-12-18 NOTE — Telephone Encounter (Signed)
[facility] refill request received for ZOLPIDEM  Last filled by MD on 12.25.13, #30 X 5  Last seen on 09/04/12  Follow up 6 MONTHS  Please advise refills/SLS

## 2012-12-18 NOTE — Telephone Encounter (Signed)
Rx request faxed to pharmacy/SLS  

## 2012-12-19 ENCOUNTER — Other Ambulatory Visit: Payer: Self-pay | Admitting: Family Medicine

## 2012-12-20 ENCOUNTER — Telehealth: Payer: Self-pay | Admitting: *Deleted

## 2012-12-20 MED ORDER — ALPRAZOLAM 0.5 MG PO TABS: ORAL_TABLET | ORAL | Status: DC

## 2012-12-20 NOTE — Telephone Encounter (Signed)
OK to RF as previously prescribed.-thx 

## 2012-12-20 NOTE — Telephone Encounter (Signed)
Fax request for refill on ZOLPIDEM Last filled - 06.04.14, #30x5, faxed to pharmacy at 5:46pm REFAXED 06.04.14 RX to [facility] [phone]/SLS

## 2012-12-20 NOTE — Telephone Encounter (Signed)
Fax request for refill on ALPRAZOLAM Last filled - 10.15.13, #120 X 5 Last seen on - 02.19.14  Follow up - 6 Months  Please advise on Rx/SLS

## 2012-12-20 NOTE — Telephone Encounter (Signed)
Rx request phoned to pharmacy/SLS  

## 2013-01-15 ENCOUNTER — Other Ambulatory Visit: Payer: Self-pay | Admitting: Family Medicine

## 2013-01-20 ENCOUNTER — Other Ambulatory Visit: Payer: Self-pay | Admitting: Family Medicine

## 2013-01-20 MED ORDER — PANTOPRAZOLE SODIUM 40 MG PO TBEC: DELAYED_RELEASE_TABLET | ORAL | Status: DC

## 2013-01-23 ENCOUNTER — Other Ambulatory Visit: Payer: Self-pay | Admitting: Family Medicine

## 2013-03-06 ENCOUNTER — Encounter: Payer: Self-pay | Admitting: Family Medicine

## 2013-03-06 ENCOUNTER — Ambulatory Visit (INDEPENDENT_AMBULATORY_CARE_PROVIDER_SITE_OTHER): Payer: PRIVATE HEALTH INSURANCE | Admitting: Family Medicine

## 2013-03-06 VITALS — BP 118/80 | HR 74 | Temp 98.3°F | Ht 69.0 in | Wt 176.5 lb

## 2013-03-06 DIAGNOSIS — R197 Diarrhea, unspecified: Secondary | ICD-10-CM

## 2013-03-06 DIAGNOSIS — Z125 Encounter for screening for malignant neoplasm of prostate: Secondary | ICD-10-CM

## 2013-03-06 DIAGNOSIS — Z0000 Encounter for general adult medical examination without abnormal findings: Secondary | ICD-10-CM

## 2013-03-06 LAB — CBC WITH DIFFERENTIAL/PLATELET
Basophils Absolute: 0 10*3/uL (ref 0.0–0.1)
Basophils Relative: 0.3 % (ref 0.0–3.0)
Eosinophils Absolute: 0.6 10*3/uL (ref 0.0–0.7)
Eosinophils Relative: 7.7 % — ABNORMAL HIGH (ref 0.0–5.0)
HCT: 42.2 % (ref 39.0–52.0)
Hemoglobin: 14.9 g/dL (ref 13.0–17.0)
Lymphocytes Relative: 24.3 % (ref 12.0–46.0)
Lymphs Abs: 1.8 10*3/uL (ref 0.7–4.0)
MCHC: 35.2 g/dL (ref 30.0–36.0)
MCV: 97.3 fl (ref 78.0–100.0)
Monocytes Absolute: 0.6 10*3/uL (ref 0.1–1.0)
Monocytes Relative: 8.4 % (ref 3.0–12.0)
Neutro Abs: 4.4 10*3/uL (ref 1.4–7.7)
Neutrophils Relative %: 59.3 % (ref 43.0–77.0)
Platelets: 205 10*3/uL (ref 150.0–400.0)
RBC: 4.34 Mil/uL (ref 4.22–5.81)
RDW: 12.8 % (ref 11.5–14.6)
WBC: 7.4 10*3/uL (ref 4.5–10.5)

## 2013-03-06 LAB — COMPREHENSIVE METABOLIC PANEL
ALT: 26 U/L (ref 0–53)
AST: 24 U/L (ref 0–37)
Albumin: 4.4 g/dL (ref 3.5–5.2)
Alkaline Phosphatase: 66 U/L (ref 39–117)
BUN: 14 mg/dL (ref 6–23)
CO2: 25 mEq/L (ref 19–32)
Calcium: 9.2 mg/dL (ref 8.4–10.5)
Chloride: 106 mEq/L (ref 96–112)
Creatinine, Ser: 0.9 mg/dL (ref 0.4–1.5)
GFR: 95.14 mL/min (ref >60.00)
Glucose, Bld: 85 mg/dL (ref 70–99)
Potassium: 3.9 mEq/L (ref 3.5–5.1)
Sodium: 137 mEq/L (ref 135–145)
Total Bilirubin: 1.7 mg/dL — ABNORMAL HIGH (ref 0.3–1.2)
Total Protein: 6.6 g/dL (ref 6.0–8.3)

## 2013-03-06 LAB — LIPID PANEL
Cholesterol: 208 mg/dL — ABNORMAL HIGH (ref 0–200)
HDL: 41 mg/dL (ref >39.00)
Total CHOL/HDL Ratio: 5
Triglycerides: 318 mg/dL — ABNORMAL HIGH (ref 0.0–149.0)
VLDL: 63.6 mg/dL — ABNORMAL HIGH (ref 0.0–40.0)

## 2013-03-06 LAB — LDL CHOLESTEROL, DIRECT: Direct LDL: 117.3 mg/dL

## 2013-03-06 LAB — TSH: TSH: 2.64 u[IU]/mL (ref 0.35–5.50)

## 2013-03-06 LAB — PSA: PSA: 0.39 ng/mL (ref 0.10–4.00)

## 2013-03-06 MED ORDER — KETOCONAZOLE 2 % EX SHAM: MEDICATED_SHAMPOO | CUTANEOUS | Status: DC

## 2013-03-06 NOTE — Assessment & Plan Note (Signed)
Reviewed age and gender appropriate health maintenance issues (prudent diet, regular exercise, health risks of tobacco and excessive alcohol, use of seatbelts, fire alarms in home, use of sunscreen).  Also reviewed age and gender appropriate health screening as well as vaccine recommendations. Fasting labs today with PSA as well. DRE normal. Plan for repeat colonoscopy after 07/2013 per pt preference. Start probiotic to see if this helps his brief periods of loose BMs that still occur since last bout of GE/diverticulitis a few months ago. Regarding his elevated bp, I told him he has true stage 1 HTN but he still wants to decline bp med at this time, citing "I just don't tolerate/do well on meds.  He'll continue to monitor bp at home and call if consistently rising.

## 2013-03-06 NOTE — Progress Notes (Signed)
Office Note 03/06/2013  CC:  Chief Complaint  Patient presents with  . Follow-up    HPI:  Keith Sanders is a 62 y.o. White male who is here for CPE. Review of 25 bp readings from home show avg syst around mid 130s (high of 148), avg diast in 90s (high 102), avg HR 90 or so.  Still says stress at work plays a role, working long hours---doesn't exercise or eat a good low Na/DASH diet.  Also says he still has 1-2 random days a week in which he has 3-4 watery BMs w/out blood or mucous--ever since getting abx for last bout of diverticulitis.  No abd pain, nausea, or fevers.  He is not on a probiotic. He called Dr. Marzetta Board office about repeat colonoscopy due to hx of polyps and they were 2 mo out on appts for this.  Pt wants to wait until bowels more consistently calm and says his employer will cover the colonoscopy 100% after 07/17/13--so he asks if he can wait until then to schedule repeat TCS.    Past Medical History  Diagnosis Date  . Chronic insomnia   . Chronic lumbar pain     DDD; ibuprofen, occasional muscle relaxers.  MRI L-spine 2007 showed some bulging discs with nerve impingement.  Has never had back injection or surgery.  . Contact dermatitis and eczema   . PUD (peptic ulcer disease)   . Diverticulitis   . Anxiety   . Gout   . History of adenomatous polyp of colon 2005  . Elevated blood pressure reading without diagnosis of hypertension   . Bronchitis 08/11/2012    Past Surgical History  Procedure Laterality Date  . Colonoscopy w/ polypectomy  2005    Dr. Arlyce Dice    Family History  Problem Relation Age of Onset  . Heart attack Father 70  . Heart disease Father     MI age 40  . Heart disease Other     History   Social History  . Marital Status: Married    Spouse Name: N/A    Number of Children: N/A  . Years of Education: N/A   Occupational History  . Not on file.   Social History Main Topics  . Smoking status: Never Smoker   . Smokeless tobacco: Never  Used  . Alcohol Use: Yes  . Drug Use: No  . Sexual Activity: Yes   Other Topics Concern  . Not on file   Social History Narrative   Married, 3 grown children.   Works in Counselling psychologist Financial trader).   Lived in [place] area all his life.   No T/A/Ds.   Walks 3-4 times per day.    Outpatient Prescriptions Prior to Visit  Medication Sig Dispense Refill  . ALPRAZolam (XANAX) 0.5 MG tablet Take1 tab po qid prn stress  120 tablet  5  . Ascorbic Acid (VITAMIN C) 1000 MG tablet Take 1,000 mg by mouth daily.        Marland Kitchen aspirin 81 MG tablet Take 81 mg by mouth daily.        . fluticasone (FLONASE) 50 MCG/ACT nasal spray instill 2 sprays into each nostril once daily if needed for allergies  16 g  5  . ibuprofen (ADVIL,MOTRIN) 800 MG tablet Take 1 tablet (800 mg total) by mouth every 8 (eight) hours as needed for pain.  90 tablet  5  . ketoconazole (NIZORAL) 2 % shampoo Apply to scalp every other day as needed for scalp seborrheic  dermatitis  120 mL  2  . metaxalone (SKELAXIN) 800 MG tablet 1/2-1 tab po tid prn  90 tablet  1  . Multiple Vitamin (MULTIVITAMIN) tablet Take 1 tablet by mouth daily.        . Omega-3 Fatty Acids (FISH OIL) 875 MG CAPS Take 2 capsules by mouth daily.        . pantoprazole (PROTONIX) 40 MG tablet 1 tab po qAM  30 tablet  2  . triamcinolone cream (KENALOG) 0.1 % apply to affected area twice a day  45 g  5  . zolpidem (AMBIEN CR) 12.5 MG CR tablet take 1 tablet at bedtime if needed  30 tablet  5   No facility-administered medications prior to visit.    No Known Allergies  ROS Review of Systems  Constitutional: Negative for fever, chills, appetite change and fatigue.  HENT: Negative for ear pain, congestion, sore throat, neck stiffness and dental problem.   Eyes: Negative for discharge, redness and visual disturbance.  Respiratory: Negative for cough, chest tightness, shortness of breath and wheezing.   Cardiovascular: Negative for chest pain, palpitations and leg  swelling.  Gastrointestinal: Positive for diarrhea (as mentioned in HPI). Negative for nausea, vomiting, abdominal pain and blood in stool.  Genitourinary: Negative for dysuria, urgency, frequency, hematuria, flank pain and difficulty urinating.  Musculoskeletal: Positive for back pain. Negative for myalgias, joint swelling and arthralgias.  Skin: Negative for pallor and rash.  Neurological: Negative for dizziness, speech difficulty, weakness and headaches.  Hematological: Negative for adenopathy. Does not bruise/bleed easily.  Psychiatric/Behavioral: Negative for confusion and sleep disturbance. The patient is not nervous/anxious.      PE; Blood pressure 118/80, pulse 74, temperature 98.3 F (36.8 C), temperature source Oral, height 5\' 9"  (1.753 m), weight 176 lb 8 oz (80.06 kg), SpO2 97.00%. Gen: Alert, well appearing.  Patient is oriented to person, place, time, and situation. AFFECT: pleasant, lucid thought and speech. ENT: Ears: EACs clear, normal epithelium.  TMs with good light reflex and landmarks bilaterally.  Eyes: no injection, icteris, swelling, or exudate.  EOMI, PERRLA. Nose: no drainage or turbinate edema/swelling.  No injection or focal lesion.  Mouth: lips without lesion/swelling.  Oral mucosa pink and moist.  Dentition intact and without obvious caries or gingival swelling.  Oropharynx without erythema, exudate, or swelling.  Neck: supple/nontender.  No LAD, mass, or TM.  Carotid pulses 2+ bilaterally, without bruits. CV: RRR, no m/r/g.   LUNGS: CTA bilat, nonlabored resps, good aeration in all lung fields. ABD: soft, NT, ND, BS normal.  No hepatospenomegaly or mass.  No bruits. EXT: no clubbing, cyanosis, or edema.  Musculoskeletal: no joint swelling, erythema, warmth, or tenderness.  ROM of all joints intact. Skin - no sores or suspicious lesions or rashes or color changes Rectal exam: negative without mass, lesions or tenderness, PROSTATE EXAM: smooth and symmetric  without nodules or tenderness.   Pertinent labs:  None today  ASSESSMENT AND PLAN:   Health maintenance examination Reviewed age and gender appropriate health maintenance issues (prudent diet, regular exercise, health risks of tobacco and excessive alcohol, use of seatbelts, fire alarms in home, use of sunscreen).  Also reviewed age and gender appropriate health screening as well as vaccine recommendations. Fasting labs today with PSA as well. DRE normal. Plan for repeat colonoscopy after 07/2013 per pt preference. Start probiotic to see if this helps his brief periods of loose BMs that still occur since last bout of GE/diverticulitis a few months ago. Regarding  his elevated bp, I told him he has true stage 1 HTN but he still wants to decline bp med at this time, citing "I just don't tolerate/do well on meds.  He'll continue to monitor bp at home and call if consistently rising.     An After Visit Summary was printed and given to the patient.  FOLLOW UP:  Return in about 6 months (around 09/06/2013) for f/u HTN.

## 2013-03-26 ENCOUNTER — Other Ambulatory Visit: Payer: Self-pay | Admitting: Family Medicine

## 2013-04-23 ENCOUNTER — Other Ambulatory Visit: Payer: Self-pay | Admitting: Family Medicine

## 2013-04-25 ENCOUNTER — Other Ambulatory Visit: Payer: Self-pay | Admitting: Family Medicine

## 2013-05-16 ENCOUNTER — Encounter: Payer: Self-pay | Admitting: Gastroenterology

## 2013-06-02 ENCOUNTER — Other Ambulatory Visit: Payer: Self-pay | Admitting: Family Medicine

## 2013-06-02 NOTE — Telephone Encounter (Signed)
Called pharmacy to verify when patient last picked up his ambien.  Patient is not due for refill until 06/06/13.

## 2013-06-03 ENCOUNTER — Other Ambulatory Visit: Payer: Self-pay | Admitting: Family Medicine

## 2013-06-03 NOTE — Telephone Encounter (Signed)
LMOVM letting patient know that rx has been faxed, but also we need him to call the office back.

## 2013-06-03 NOTE — Telephone Encounter (Signed)
I called patient to go over refill history of ambien and he admittedly told me that he may take an extra dose on occasion.  Patient has history of filled ambien early since his last RX in June.   His last rx pick up was on 05/06/13.  I advised patient of the danger of taking this medication more than prescribed.

## 2013-06-03 NOTE — Telephone Encounter (Signed)
Patient returned call and schedule/d appointment

## 2013-06-03 NOTE — Telephone Encounter (Signed)
Pls notify pt I will do a 30 d supply of ambien CR 12.5mg .  Pls have him make appt before this rx runs out to come in and discuss possible additions or changes that could be made to help with his insomnia. -thx

## 2013-06-16 ENCOUNTER — Ambulatory Visit (INDEPENDENT_AMBULATORY_CARE_PROVIDER_SITE_OTHER): Payer: PRIVATE HEALTH INSURANCE | Admitting: Family Medicine

## 2013-06-16 ENCOUNTER — Encounter: Payer: Self-pay | Admitting: Family Medicine

## 2013-06-16 VITALS — BP 158/101 | HR 82 | Temp 98.4°F | Resp 18 | Ht 69.0 in | Wt 176.0 lb

## 2013-06-16 DIAGNOSIS — I10 Essential (primary) hypertension: Secondary | ICD-10-CM

## 2013-06-16 DIAGNOSIS — G4700 Insomnia, unspecified: Secondary | ICD-10-CM

## 2013-06-16 DIAGNOSIS — F411 Generalized anxiety disorder: Secondary | ICD-10-CM | POA: Insufficient documentation

## 2013-06-16 MED ORDER — ZOLPIDEM TARTRATE ER 12.5 MG PO TBCR: EXTENDED_RELEASE_TABLET | ORAL | Status: DC

## 2013-06-16 NOTE — Progress Notes (Addendum)
Pre visit review using our clinic review tool, if applicable. No additional management support is needed unless otherwise documented below in the visit note. OFFICE NOTE  06/16/2013  CC:  Chief Complaint  Patient presents with  . Follow-up     HPI: Patient is a 62 y.o. Caucasian male who is here for f/u stage 1 HTN.  He has refused trial of meds in recent past, citing that he just doesn't "do well on meds". He has never been a smoker.  He takes an 81mg  ASA qd. Home bp avg 120s/80s, HR 80s.  Made some basic dietary changes.  No exercise at this time, still working LONG hours. Mother had devastating stroke over the weekend and he is obviously stressed today about this.  He is taking one ambien CR per night--not taking any extra. He is taking xanax 1/2-1 qid regularly.   Stress at work is his main issue.  Pertinent PMH:  Past Medical History  Diagnosis Date  . Chronic insomnia   . Chronic lumbar pain     DDD; ibuprofen, occasional muscle relaxers.  MRI L-spine 2007 showed some bulging discs with nerve impingement.  Has never had back injection or surgery.  . Contact dermatitis and eczema   . PUD (peptic ulcer disease)   . Diverticulitis   . Anxiety   . Gout   . History of adenomatous polyp of colon 2005  . HTN (hypertension)   . History of bronchitis 08/11/2012   Past surgical, social, and family history reviewed and no changes noted since last office visit.  MEDS:  Outpatient Prescriptions Prior to Visit  Medication Sig Dispense Refill  . ALPRAZolam (XANAX) 0.5 MG tablet Take1 tab po qid prn stress  120 tablet  5  . Ascorbic Acid (VITAMIN C) 1000 MG tablet Take 1,000 mg by mouth daily.        Marland Kitchen aspirin 81 MG tablet Take 81 mg by mouth daily.        . fluticasone (FLONASE) 50 MCG/ACT nasal spray instill 2 sprays into each nostril once daily if needed for allergies  16 g  5  . ibuprofen (ADVIL,MOTRIN) 800 MG tablet Take 1 tablet (800 mg total) by mouth every 8 (eight) hours as  needed for pain.  90 tablet  5  . ketoconazole (NIZORAL) 2 % shampoo Apply to scalp every other day as needed for scalp seborrheic dermatitis  120 mL  2  . metaxalone (SKELAXIN) 800 MG tablet 1/2-1 tab po tid prn  90 tablet  1  . Multiple Vitamin (MULTIVITAMIN) tablet Take 1 tablet by mouth daily.        . Omega-3 Fatty Acids (FISH OIL) 875 MG CAPS Take 2 capsules by mouth daily.        . pantoprazole (PROTONIX) 40 MG tablet take 1 tablet by mouth every morning  30 tablet  2  . triamcinolone cream (KENALOG) 0.1 % apply to affected area twice a day  45 g  5  . zolpidem (AMBIEN CR) 12.5 MG CR tablet take 1 tablet at bedtime if needed  30 tablet  0  . pantoprazole (PROTONIX) 40 MG tablet take 1 tablet by mouth every morning  30 tablet  2   No facility-administered medications prior to visit.    PE: Blood pressure 158/101, pulse 82, temperature 98.4 F (36.9 C), temperature source Temporal, resp. rate 18, height 5\' 9"  (1.753 m), weight 176 lb (79.833 kg), SpO2 96.00%. Gen: Alert, well appearing.  Patient is oriented to  person, place, time, and situation. AFFECT: pleasant, lucid thought and speech. No further exam today.  IMPRESSION AND PLAN:  1) HTN; well controlled on current diet alone.  He does have some white coat syndrome component. Continue to monitor at home.  Reviewed goal/normal bp today.  Encouraged him to fit in exercise when he can.  2) Anxiety; stable.  The current medical regimen is effective;  continue present plan and medications. Controlled substance contract reviewed with patient today.  Patient signed this and it will be placed in the chart.   No new rx's given for this prob today.  3) chronic insomnia: The current medical regimen is effective;  continue present plan and medications. Ambien CR 12.5mg , 1 tab po qhs.  New rx given today for #30, with 5 additional RFs. Therapeutic expectations and side effect profile of medication discussed today.  Patient's questions  answered.  4) Hx of adenomatous colon polyps in 2005: he has set up his f/u colonoscopy for early 2015.  FOLLOW UP: 6 mo

## 2013-06-30 ENCOUNTER — Telehealth: Payer: Self-pay | Admitting: Family Medicine

## 2013-06-30 MED ORDER — ALPRAZOLAM 0.5 MG PO TABS: ORAL_TABLET | ORAL | Status: DC

## 2013-06-30 NOTE — Telephone Encounter (Signed)
Xanax rx printed. 

## 2013-06-30 NOTE — Telephone Encounter (Signed)
Patient requesting refill on alprazolam.  Patient last seen 06/16/13.  Last rx was 12/20/12 x 5 refills.  Please advise refill.

## 2013-07-17 HISTORY — PX: COLONOSCOPY: SHX174

## 2013-07-29 ENCOUNTER — Ambulatory Visit (AMBULATORY_SURGERY_CENTER): Payer: Self-pay | Admitting: *Deleted

## 2013-07-29 VITALS — Ht 70.0 in | Wt 178.6 lb

## 2013-07-29 DIAGNOSIS — Z86010 Personal history of colonic polyps: Secondary | ICD-10-CM

## 2013-07-29 MED ORDER — NA SULFATE-K SULFATE-MG SULF 17.5-3.13-1.6 GM/177ML PO SOLN: 1.0000 | Freq: Once | ORAL | Status: DC

## 2013-07-29 NOTE — Progress Notes (Signed)
No allergies to eggs or soy. No problems with anesthesia.  

## 2013-08-12 ENCOUNTER — Encounter: Payer: Self-pay | Admitting: Gastroenterology

## 2013-08-12 ENCOUNTER — Ambulatory Visit (AMBULATORY_SURGERY_CENTER): Payer: PRIVATE HEALTH INSURANCE | Admitting: Gastroenterology

## 2013-08-12 VITALS — BP 153/95 | HR 69 | Temp 97.4°F | Resp 21 | Ht 70.0 in | Wt 178.0 lb

## 2013-08-12 DIAGNOSIS — Z86010 Personal history of colonic polyps: Secondary | ICD-10-CM

## 2013-08-12 MED ORDER — SODIUM CHLORIDE 0.9 % IV SOLN: 500.0000 mL | INTRAVENOUS | Status: DC

## 2013-08-12 NOTE — Patient Instructions (Signed)

## 2013-08-12 NOTE — Progress Notes (Signed)
Report to pacu rn, vss, bbs=clear 

## 2013-08-12 NOTE — Op Note (Signed)
[facility]  [address] [place], [id number]   COLONOSCOPY PROCEDURE REPORT  PATIENT: Keith, Sanders  MR#: [id number] BIRTHDATE: 03/19/1951 , 62  yrs. old GENDER: Male ENDOSCOPIST: Inda Castle, MD REFERRED OX:[id number] Anitra Lauth, M.D. PROCEDURE DATE:  08/12/2013 PROCEDURE:   Colonoscopy, diagnostic First Screening Colonoscopy - Avg.  risk and is 50 yrs.  old or older - No.  Prior Negative Screening - Now for repeat screening. N/A  History of Adenoma - Now for follow-up colonoscopy & has been > or = to 3 yrs.  Yes hx of adenoma.  Has been 3 or more years since last colonoscopy.  Polyps Removed Today? No.  Recommend repeat exam, <10 yrs? No. ASA CLASS:   Class II INDICATIONS:Patient's personal history of colon polyps 2004 MEDICATIONS: MAC sedation, administered by CRNA and propofol (Diprivan) 250mg  IV  DESCRIPTION OF PROCEDURE:   After the risks benefits and alternatives of the procedure were thoroughly explained, informed consent was obtained.  A digital rectal exam revealed no abnormalities of the rectum.   The LB [id number]  endoscope was introduced through the anus and advanced to the cecum, which was identified by both the appendix and ileocecal valve. No adverse events experienced.   The quality of the prep was excellent using Suprep  The instrument was then slowly withdrawn as the colon was fully examined.      COLON FINDINGS: A normal appearing cecum, ileocecal valve, and appendiceal orifice were identified.  The ascending, hepatic flexure, transverse, splenic flexure, descending, sigmoid colon and rectum appeared unremarkable.  No polyps or cancers were seen. Retroflexed views revealed no abnormalities. The time to cecum=2 minutes 35 seconds.  Withdrawal time=8 minutes 33 seconds.  The scope was withdrawn and the procedure completed. COMPLICATIONS: There were no complications.  ENDOSCOPIC IMPRESSION: Normal  colon  RECOMMENDATIONS: Continue current colorectal screening recommendations for "routine risk" patients with a repeat colonoscopy in 10 years.   eSigned:  Inda Castle, MD 08/12/2013 9:14 AM   cc:

## 2013-08-13 ENCOUNTER — Telehealth: Payer: Self-pay | Admitting: *Deleted

## 2013-08-13 NOTE — Telephone Encounter (Signed)
  Follow up Call-  Call back number 08/12/2013  Post procedure Call Back phone  # [phone]  Permission to leave phone message Yes     Patient questions:  Do you have a fever, pain , or abdominal swelling? no Pain Score  0 *  Have you tolerated food without any problems? yes  Have you been able to return to your normal activities? yes  Do you have any questions about your discharge instructions: Diet   no Medications  no Follow up visit  no  Do you have questions or concerns about your Care? no  Actions: * If pain score is 4 or above: No action needed, pain <4.

## 2013-09-01 ENCOUNTER — Other Ambulatory Visit: Payer: Self-pay | Admitting: Family Medicine

## 2013-09-01 MED ORDER — PANTOPRAZOLE SODIUM 40 MG PO TBEC: DELAYED_RELEASE_TABLET | ORAL | Status: DC

## 2013-09-08 ENCOUNTER — Other Ambulatory Visit: Payer: Self-pay

## 2013-09-08 MED ORDER — IBUPROFEN 800 MG PO TABS: 800.0000 mg | ORAL_TABLET | Freq: Three times a day (TID) | ORAL | Status: DC | PRN

## 2013-09-08 MED ORDER — PANTOPRAZOLE SODIUM 40 MG PO TBEC: DELAYED_RELEASE_TABLET | ORAL | Status: DC

## 2013-09-17 ENCOUNTER — Encounter: Payer: Self-pay | Admitting: Family Medicine

## 2013-09-17 ENCOUNTER — Ambulatory Visit (INDEPENDENT_AMBULATORY_CARE_PROVIDER_SITE_OTHER): Payer: PRIVATE HEALTH INSURANCE | Admitting: Family Medicine

## 2013-09-17 VITALS — BP 156/101 | HR 83 | Temp 99.0°F | Resp 18 | Ht 69.0 in | Wt 181.0 lb

## 2013-09-17 DIAGNOSIS — J069 Acute upper respiratory infection, unspecified: Secondary | ICD-10-CM

## 2013-09-17 DIAGNOSIS — J209 Acute bronchitis, unspecified: Secondary | ICD-10-CM

## 2013-09-17 MED ORDER — PREDNISONE 20 MG PO TABS: ORAL_TABLET | ORAL | Status: DC

## 2013-09-17 MED ORDER — ALBUTEROL SULFATE HFA 108 (90 BASE) MCG/ACT IN AERS: 2.0000 | INHALATION_SPRAY | RESPIRATORY_TRACT | Status: DC | PRN

## 2013-09-17 NOTE — Progress Notes (Signed)
Pre visit review using our clinic review tool, if applicable. No additional management support is needed unless otherwise documented below in the visit note. 

## 2013-09-17 NOTE — Progress Notes (Signed)
OFFICE NOTE  09/17/2013  CC:  Chief Complaint  Patient presents with  . Cough    x 3 weeks  . Nasal Congestion     HPI: Patient is a 62 y.o. Caucasian male who is here for cough/congestion. Cough x 3 wks approx, hacky, improving some but now notes onset today of nasal congestion/runny nose/pressure above eyes. No fever.  No ST.   Mucinex DM helping cough a little.  No chest pains, no rash.  Pertinent PMH:  Past medical, surgical, social history reviewed and no changes are noted since last office visit.  MEDS:  Outpatient Prescriptions Prior to Visit  Medication Sig Dispense Refill  . ALPRAZolam (XANAX) 0.5 MG tablet Take1 tab po qid prn stress  120 tablet  5  . Ascorbic Acid (VITAMIN C) 1000 MG tablet Take 1,000 mg by mouth daily.        Marland Kitchen aspirin 81 MG tablet Take 81 mg by mouth daily.        . fluticasone (FLONASE) 50 MCG/ACT nasal spray instill 2 sprays into each nostril once daily if needed for allergies  16 g  5  . ibuprofen (ADVIL,MOTRIN) 800 MG tablet Take 1 tablet (800 mg total) by mouth every 8 (eight) hours as needed.  90 tablet  0  . metaxalone (SKELAXIN) 800 MG tablet 1/2-1 tab po tid prn  90 tablet  1  . Multiple Vitamin (MULTIVITAMIN) tablet Take 1 tablet by mouth daily.        . Omega-3 Fatty Acids (FISH OIL) 875 MG CAPS Take 2 capsules by mouth daily.        . pantoprazole (PROTONIX) 40 MG tablet take 1 tablet by mouth every morning  30 tablet  2  . zolpidem (AMBIEN CR) 12.5 MG CR tablet take 1 tablet at bedtime if needed  30 tablet  5  . ketoconazole (NIZORAL) 2 % shampoo Apply to scalp every other day as needed for scalp seborrheic dermatitis  120 mL  2  . triamcinolone cream (KENALOG) 0.1 % apply to affected area twice a day  45 g  5   No facility-administered medications prior to visit.    PE: Blood pressure 156/101, pulse 83, temperature 99 F (37.2 C), temperature source Temporal, resp. rate 18, height 5\' 9"  (1.753 m), weight 181 lb (82.101 kg), SpO2  97.00%. VS: noted--normal. Gen: alert, NAD, NONTOXIC APPEARING. HEENT: eyes without injection, drainage, or swelling.  Ears: EACs clear, TMs with normal light reflex and landmarks.  Nose: Clear rhinorrhea, with some dried, crusty exudate adherent to mildly injected mucosa.  No purulent d/c.  No paranasal sinus TTP.  No facial swelling.  Throat and mouth without focal lesion.  No pharyngial swelling, erythema, or exudate.   Neck: supple, no LAD.   LUNGS: CTA bilat, nonlabored resps.  He has mildly prolonged expiratory phase and mild diminished aeration diffusely on expiration. CV: RRR, no m/r/g. EXT: no c/c/e SKIN: no rash   IMPRESSION AND PLAN:  Viral URI with acute bronchitis/mild RAD. Prednisone 40mg  qd x 5d, albuterol HFA 2 puffs q4h prn. Continue flonase, add saline nasal spray.  May continue mucinex DM.  An After Visit Summary was printed and given to the patient.  FOLLOW UP: prn

## 2013-10-06 ENCOUNTER — Encounter: Payer: Self-pay | Admitting: Family Medicine

## 2013-10-06 ENCOUNTER — Ambulatory Visit (INDEPENDENT_AMBULATORY_CARE_PROVIDER_SITE_OTHER): Payer: PRIVATE HEALTH INSURANCE | Admitting: Family Medicine

## 2013-10-06 VITALS — BP 157/105 | HR 87 | Temp 98.5°F | Resp 18 | Ht 69.0 in | Wt 185.0 lb

## 2013-10-06 DIAGNOSIS — M109 Gout, unspecified: Secondary | ICD-10-CM

## 2013-10-06 MED ORDER — METHYLPREDNISOLONE ACETATE 80 MG/ML IJ SUSP
80.0000 mg | Freq: Once | INTRAMUSCULAR | Status: AC
  Administered 2013-10-06: 80 mg via INTRAMUSCULAR

## 2013-10-06 NOTE — Progress Notes (Signed)
OFFICE NOTE  10/06/2013  CC:  Chief Complaint  Patient presents with  . Foot Pain    right foot     HPI: Patient is a 63 y.o. Caucasian male who is here for right great toe pain. +Hx of gout in same area about every 3-4 yrs.  Onset 4 d/a, severe pain in right MTP joint, warm to touch, could barely walk on it.  Ibup 800 tid since onset has taken the edge off. Still kept him from sleeping (pain).   Pt recently on vacation and ate lots of red meat and fish.  Pertinent PMH:  Past medical, surgical, social, and family history reviewed and no changes are noted since last office visit.  MEDS:  Outpatient Prescriptions Prior to Visit  Medication Sig Dispense Refill  . ALPRAZolam (XANAX) 0.5 MG tablet Take1 tab po qid prn stress  120 tablet  5  . Ascorbic Acid (VITAMIN C) 1000 MG tablet Take 1,000 mg by mouth daily.        Marland Kitchen aspirin 81 MG tablet Take 81 mg by mouth daily.        . fluticasone (FLONASE) 50 MCG/ACT nasal spray instill 2 sprays into each nostril once daily if needed for allergies  16 g  5  . ibuprofen (ADVIL,MOTRIN) 800 MG tablet Take 1 tablet (800 mg total) by mouth every 8 (eight) hours as needed.  90 tablet  0  . metaxalone (SKELAXIN) 800 MG tablet 1/2-1 tab po tid prn  90 tablet  1  . Multiple Vitamin (MULTIVITAMIN) tablet Take 1 tablet by mouth daily.        . Omega-3 Fatty Acids (FISH OIL) 875 MG CAPS Take 2 capsules by mouth daily.        . pantoprazole (PROTONIX) 40 MG tablet take 1 tablet by mouth every morning  30 tablet  2  . triamcinolone cream (KENALOG) 0.1 % apply to affected area twice a day  45 g  5  . zolpidem (AMBIEN CR) 12.5 MG CR tablet take 1 tablet at bedtime if needed  30 tablet  5  . albuterol (VENTOLIN HFA) 108 (90 BASE) MCG/ACT inhaler Inhale 2 puffs into the lungs every 4 (four) hours as needed for wheezing or shortness of breath.  1 Inhaler  0  . ketoconazole (NIZORAL) 2 % shampoo Apply to scalp every other day as needed for scalp seborrheic  dermatitis  120 mL  2  . predniSONE (DELTASONE) 20 MG tablet 2 tabs po qd x 5d  10 tablet  0   No facility-administered medications prior to visit.    PE: Blood pressure 157/105, pulse 87, temperature 98.5 F (36.9 C), temperature source Temporal, resp. rate 18, height 5\' 9"  (1.753 m), weight 185 lb (83.915 kg), SpO2 97.00%. Gen: Alert, well appearing.  Patient is oriented to person, place, time, and situation. Right great toe MTP joint warm and erythematous, minimally swollen.  Very tender to touch over this MTP joint, but all other joints in toes/feet nontender and w/out erythema or warmth.  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  Likely related to high purine diet while on vacation. Pt prefers steroid injection as treatment so I gave depo-medrol 80mg  IM today. He declined my offer of a few pain pills. No NSAID over the next 3 days.    Historically his flares have been infrequent enough that no xanthine oxidase inhibitors are needed.  An After Visit Summary was printed and given to the patient.  FOLLOW UP: prn

## 2013-10-06 NOTE — Progress Notes (Signed)
Pre visit review using our clinic review tool, if applicable. No additional management support is needed unless otherwise documented below in the visit note. 

## 2013-10-09 ENCOUNTER — Ambulatory Visit: Payer: PRIVATE HEALTH INSURANCE | Admitting: Family Medicine

## 2013-10-09 ENCOUNTER — Encounter: Payer: Self-pay | Admitting: Family Medicine

## 2013-10-09 VITALS — BP 158/99 | HR 70 | Temp 97.6°F | Resp 18 | Ht 69.0 in | Wt 181.0 lb

## 2013-10-09 DIAGNOSIS — M109 Gout, unspecified: Secondary | ICD-10-CM

## 2013-10-09 MED ORDER — METHYLPREDNISOLONE ACETATE 40 MG/ML IJ SUSP
40.0000 mg | Freq: Once | INTRAMUSCULAR | Status: AC
  Administered 2013-10-09: 40 mg via INTRA_ARTICULAR

## 2013-10-09 MED ORDER — PREDNISONE 20 MG PO TABS: ORAL_TABLET | ORAL | Status: DC

## 2013-10-09 NOTE — Progress Notes (Signed)
Pre visit review using our clinic review tool, if applicable. No additional management support is needed unless otherwise documented below in the visit note. 

## 2013-10-09 NOTE — Progress Notes (Signed)
OFFICE NOTE  10/09/2013  CC:  Chief Complaint  Patient presents with  . Follow-up    got better, now getting bad again     HPI: Patient is a 63 y.o. Caucasian male who is here for right MTP pain, dx'd gout 3 d/a, got 80mg  depo-medrol IM that day. Got better--about 60% over the 1-2 following injection, then last 24h all pain and swelling has returned and also the other MTPs on same foot hurt.   Taking tylenol for pain.  Pertinent PMH:  Past medical, surgical, social, and family history reviewed and no changes are noted since last office visit.  MEDS:  Outpatient Prescriptions Prior to Visit  Medication Sig Dispense Refill  . albuterol (VENTOLIN HFA) 108 (90 BASE) MCG/ACT inhaler Inhale 2 puffs into the lungs every 4 (four) hours as needed for wheezing or shortness of breath.  1 Inhaler  0  . ALPRAZolam (XANAX) 0.5 MG tablet Take1 tab po qid prn stress  120 tablet  5  . Ascorbic Acid (VITAMIN C) 1000 MG tablet Take 1,000 mg by mouth daily.        Marland Kitchen aspirin 81 MG tablet Take 81 mg by mouth daily.        . fluticasone (FLONASE) 50 MCG/ACT nasal spray instill 2 sprays into each nostril once daily if needed for allergies  16 g  5  . ibuprofen (ADVIL,MOTRIN) 800 MG tablet Take 1 tablet (800 mg total) by mouth every 8 (eight) hours as needed.  90 tablet  0  . ketoconazole (NIZORAL) 2 % shampoo Apply to scalp every other day as needed for scalp seborrheic dermatitis  120 mL  2  . metaxalone (SKELAXIN) 800 MG tablet 1/2-1 tab po tid prn  90 tablet  1  . Multiple Vitamin (MULTIVITAMIN) tablet Take 1 tablet by mouth daily.        . Omega-3 Fatty Acids (FISH OIL) 875 MG CAPS Take 2 capsules by mouth daily.        . pantoprazole (PROTONIX) 40 MG tablet take 1 tablet by mouth every morning  30 tablet  2  . triamcinolone cream (KENALOG) 0.1 % apply to affected area twice a day  45 g  5  . zolpidem (AMBIEN CR) 12.5 MG CR tablet take 1 tablet at bedtime if needed  30 tablet  5   No  facility-administered medications prior to visit.    PE: Blood pressure 158/99, pulse 70, temperature 97.6 F (36.4 C), temperature source Temporal, resp. rate 18, height 5\' 9"  (1.753 m), weight 181 lb (82.101 kg), SpO2 99.00%. Gen: Alert, well appearing.  Patient is oriented to person, place, time, and situation. Right foot: warm, mildly swollen MTP joint with violacious hue, tender mostly over 1st MTP but also moderately so over the MTP joints of the other toes on right foot.  Ankles normal.   IMPRESSION AND PLAN:  Gouty arthritis, incomplete treatment of acute flare. Due to pt's travel schedule, I gave 40mg  depo-medrol IM today, and he'll start prednisone 40mg  qd x 5d, then 20mg  qd x 5d, then stop. Signs/symptoms to call or return for were reviewed and pt expressed understanding.  An After Visit Summary was printed and given to the patient.  FOLLOW UP: at end of steroid taper if not completely well

## 2013-11-06 ENCOUNTER — Telehealth: Payer: Self-pay | Admitting: Family Medicine

## 2013-11-06 NOTE — Telephone Encounter (Signed)
Refill request sent for ibuprofen 800mg .   Patient last seen 10/09/13 but I do not see this medication on patient's current med list.   Should he continue this medication or was he to stop?  Please advise.

## 2013-11-06 NOTE — Telephone Encounter (Signed)
OK to RF as previously prescribed, #90, RF x 3.-thx

## 2013-11-07 MED ORDER — IBUPROFEN 800 MG PO TABS: 800.0000 mg | ORAL_TABLET | Freq: Three times a day (TID) | ORAL | Status: DC | PRN

## 2013-11-07 NOTE — Telephone Encounter (Signed)
Rx sent # 90 x 3 refills.

## 2013-11-12 ENCOUNTER — Other Ambulatory Visit: Payer: Self-pay | Admitting: *Deleted

## 2013-11-12 MED ORDER — TRIAMCINOLONE ACETONIDE 0.1 % EX CREA: TOPICAL_CREAM | CUTANEOUS | Status: DC

## 2013-11-12 NOTE — Telephone Encounter (Signed)
Refill request for triamcinolone cream Last filled by MD on - 11/14/2011 45g x5 Last Appt: 10/09/2013 Next Appt: 03/09/2014 Please advise refill?

## 2013-12-25 ENCOUNTER — Telehealth: Payer: Self-pay | Admitting: Family Medicine

## 2013-12-25 MED ORDER — ZOLPIDEM TARTRATE ER 12.5 MG PO TBCR: EXTENDED_RELEASE_TABLET | ORAL | Status: DC

## 2013-12-25 NOTE — Telephone Encounter (Signed)
Rx faxed

## 2013-12-25 NOTE — Telephone Encounter (Signed)
Ambien Rx printed.

## 2013-12-25 NOTE — Telephone Encounter (Signed)
Rf request for [facility].  Last OV was 10/09/13.  Last RF was 06/16/13 x 5 rfs.  Please advise.

## 2013-12-26 ENCOUNTER — Other Ambulatory Visit: Payer: Self-pay | Admitting: Family Medicine

## 2013-12-26 MED ORDER — PANTOPRAZOLE SODIUM 40 MG PO TBEC: DELAYED_RELEASE_TABLET | ORAL | Status: DC

## 2014-01-01 ENCOUNTER — Telehealth: Payer: Self-pay | Admitting: Family Medicine

## 2014-01-01 ENCOUNTER — Other Ambulatory Visit: Payer: Self-pay | Admitting: Family Medicine

## 2014-01-01 MED ORDER — ALPRAZOLAM 0.5 MG PO TABS: ORAL_TABLET | ORAL | Status: DC

## 2014-01-01 NOTE — Telephone Encounter (Signed)
Pt requesting xanax RF.  Patient last seen 10/09/13.  Next OV is 03/09/14.  Last RF was printed 06/30/13 x 5 rfs.  Please advise rf.

## 2014-01-01 NOTE — Telephone Encounter (Signed)
Please send as script request. I will edit to last til next [place].

## 2014-01-01 NOTE — Telephone Encounter (Signed)
See Rx request ° °

## 2014-01-01 NOTE — Telephone Encounter (Signed)
Rx faxed to [facility]. 

## 2014-03-09 ENCOUNTER — Encounter: Payer: Self-pay | Admitting: Family Medicine

## 2014-03-09 ENCOUNTER — Ambulatory Visit (INDEPENDENT_AMBULATORY_CARE_PROVIDER_SITE_OTHER): Payer: PRIVATE HEALTH INSURANCE | Admitting: Family Medicine

## 2014-03-09 VITALS — BP 133/90 | HR 66 | Temp 98.0°F | Resp 18 | Ht 69.0 in | Wt 179.0 lb

## 2014-03-09 DIAGNOSIS — R7989 Other specified abnormal findings of blood chemistry: Secondary | ICD-10-CM | POA: Diagnosis not present

## 2014-03-09 DIAGNOSIS — Z0000 Encounter for general adult medical examination without abnormal findings: Secondary | ICD-10-CM | POA: Diagnosis not present

## 2014-03-09 DIAGNOSIS — I10 Essential (primary) hypertension: Secondary | ICD-10-CM

## 2014-03-09 DIAGNOSIS — Z125 Encounter for screening for malignant neoplasm of prostate: Secondary | ICD-10-CM | POA: Diagnosis not present

## 2014-03-09 LAB — TSH: TSH: 2.35 u[IU]/mL (ref 0.35–4.50)

## 2014-03-09 LAB — COMPREHENSIVE METABOLIC PANEL
ALT: 31 U/L (ref 0–53)
AST: 27 U/L (ref 0–37)
Albumin: 4.4 g/dL (ref 3.5–5.2)
Alkaline Phosphatase: 72 U/L (ref 39–117)
BUN: 12 mg/dL (ref 6–23)
CO2: 25 mEq/L (ref 19–32)
Calcium: 9.4 mg/dL (ref 8.4–10.5)
Chloride: 105 mEq/L (ref 96–112)
Creatinine, Ser: 0.8 mg/dL (ref 0.4–1.5)
GFR: 101.52 mL/min (ref >60.00)
Glucose, Bld: 88 mg/dL (ref 70–99)
Potassium: 4.2 mEq/L (ref 3.5–5.1)
Sodium: 139 mEq/L (ref 135–145)
Total Bilirubin: 1.2 mg/dL (ref 0.2–1.2)
Total Protein: 6.6 g/dL (ref 6.0–8.3)

## 2014-03-09 LAB — LIPID PANEL
Cholesterol: 236 mg/dL — ABNORMAL HIGH (ref 0–200)
HDL: 45.7 mg/dL (ref >39.00)
NonHDL: 190.3
Total CHOL/HDL Ratio: 5
Triglycerides: 259 mg/dL — ABNORMAL HIGH (ref 0.0–149.0)
VLDL: 51.8 mg/dL — ABNORMAL HIGH (ref 0.0–40.0)

## 2014-03-09 LAB — CBC WITH DIFFERENTIAL/PLATELET
Basophils Absolute: 0 10*3/uL (ref 0.0–0.1)
Basophils Relative: 0.3 % (ref 0.0–3.0)
Eosinophils Absolute: 1.3 10*3/uL — ABNORMAL HIGH (ref 0.0–0.7)
Eosinophils Relative: 15.5 % — ABNORMAL HIGH (ref 0.0–5.0)
HCT: 46 % (ref 39.0–52.0)
Hemoglobin: 15.9 g/dL (ref 13.0–17.0)
Lymphocytes Relative: 25.5 % (ref 12.0–46.0)
Lymphs Abs: 2.2 10*3/uL (ref 0.7–4.0)
MCHC: 34.6 g/dL (ref 30.0–36.0)
MCV: 98 fl (ref 78.0–100.0)
Monocytes Absolute: 0.6 10*3/uL (ref 0.1–1.0)
Monocytes Relative: 6.5 % (ref 3.0–12.0)
Neutro Abs: 4.6 10*3/uL (ref 1.4–7.7)
Neutrophils Relative %: 52.2 % (ref 43.0–77.0)
Platelets: 255 10*3/uL (ref 150.0–400.0)
RBC: 4.69 Mil/uL (ref 4.22–5.81)
RDW: 12.8 % (ref 11.5–15.5)
WBC: 8.7 10*3/uL (ref 4.0–10.5)

## 2014-03-09 LAB — PSA: PSA: 0.53 ng/mL (ref 0.10–4.00)

## 2014-03-09 MED ORDER — ZOSTER VACCINE LIVE 19400 UNT/0.65ML ~~LOC~~ SOLR: 0.6500 mL | Freq: Once | SUBCUTANEOUS | Status: DC

## 2014-03-09 NOTE — Assessment & Plan Note (Signed)
Reviewed age and gender appropriate health maintenance issues (prudent diet, regular exercise, health risks of tobacco and excessive alcohol, use of seatbelts, fire alarms in home, use of sunscreen).  Also reviewed age and gender appropriate health screening as well as vaccine recommendations. HP labs + PSA today.  DRE normal.  Next colonoscopy 10 yrs. Zostavax rx printed. Re: HTN, he prefers no meds at this time, continue TLC.

## 2014-03-09 NOTE — Progress Notes (Signed)
Pre visit review using our clinic review tool, if applicable. No additional management support is needed unless otherwise documented below in the visit note. 

## 2014-03-09 NOTE — Progress Notes (Signed)
Office Note 03/09/2014  CC:  Chief Complaint  Patient presents with  . Annual Exam    fasting    HPI:  Keith Sanders is a 63 y.o. White male who is here for CPE, fasting. Home bp measurements every 2 wks show syst range 1200s-140s, diast range 80s-90s.  He is still wanting to stay off meds for this, wants to keep working on diet/exercise.  Works high stress job.   Past Medical History  Diagnosis Date  . Chronic insomnia   . Chronic lumbar pain     DDD; ibuprofen, occasional muscle relaxers.  MRI L-spine 2007 showed some bulging discs with nerve impingement.  Has never had back injection or surgery.  . Contact dermatitis and eczema   . PUD (peptic ulcer disease)   . Diverticulitis   . Anxiety   . Gout     "always right big toe"--about one episode every 4 yrs  . History of adenomatous polyp of colon 2005  . HTN (hypertension)   . History of bronchitis 08/11/2012    Past Surgical History  Procedure Laterality Date  . Colonoscopy w/ polypectomy  2005    Dr. Lyla Son  . Colonoscopy  07/2013    Normal.  Recall 10 yrs.    Family History  Problem Relation Age of Onset  . Heart attack Father 57  . Heart disease Father     MI age 106  . Heart disease Other   . Colon cancer Neg Hx     History   Social History  . Marital Status: Married    Spouse Name: N/A    Number of Children: N/A  . Years of Education: N/A   Occupational History  . Not on file.   Social History Main Topics  . Smoking status: Never Smoker   . Smokeless tobacco: Never Used  . Alcohol Use: 8.4 oz/week    14 Cans of beer per week  . Drug Use: No  . Sexual Activity: Yes   Other Topics Concern  . Not on file   Social History Narrative   Married, 3 grown children.   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   Lived in [place] area all his life.   No T/A/Ds.   Walks 3-4 times per day.   MEDS: not taking prednisone listed below Outpatient Prescriptions Prior to Visit  Medication Sig Dispense  Refill  . ALPRAZolam (XANAX) 0.5 MG tablet Take1 tab po qid prn stress  120 tablet  3  . Ascorbic Acid (VITAMIN C) 1000 MG tablet Take 1,000 mg by mouth daily.        Marland Kitchen aspirin 81 MG tablet Take 81 mg by mouth daily.        . fluticasone (FLONASE) 50 MCG/ACT nasal spray instill 2 sprays into each nostril once daily if needed for allergies  16 g  5  . ibuprofen (ADVIL,MOTRIN) 800 MG tablet Take 1 tablet (800 mg total) by mouth every 8 (eight) hours as needed.  90 tablet  3  . ketoconazole (NIZORAL) 2 % shampoo Apply to scalp every other day as needed for scalp seborrheic dermatitis  120 mL  2  . metaxalone (SKELAXIN) 800 MG tablet 1/2-1 tab po tid prn  90 tablet  1  . Multiple Vitamin (MULTIVITAMIN) tablet Take 1 tablet by mouth daily.        . Omega-3 Fatty Acids (FISH OIL) 875 MG CAPS Take 2 capsules by mouth daily.        Marland Kitchen  pantoprazole (PROTONIX) 40 MG tablet take 1 tablet by mouth every morning  30 tablet  3  . triamcinolone cream (KENALOG) 0.1 % apply to affected area twice a day  45 g  5  . zolpidem (AMBIEN CR) 12.5 MG CR tablet take 1 tablet at bedtime if needed  30 tablet  5  . albuterol (VENTOLIN HFA) 108 (90 BASE) MCG/ACT inhaler Inhale 2 puffs into the lungs every 4 (four) hours as needed for wheezing or shortness of breath.  1 Inhaler  0  . predniSONE (DELTASONE) 20 MG tablet 2 tabs po qd x 5 days, 1 tab po qd x 5 days.  ( begin 10/10/13 )  15 tablet  0   No facility-administered medications prior to visit.    No Known Allergies  ROS Review of Systems  Constitutional: Negative for fever, chills, appetite change and fatigue.  HENT: Negative for congestion, dental problem, ear pain and sore throat.   Eyes: Negative for discharge, redness and visual disturbance.  Respiratory: Negative for cough, chest tightness, shortness of breath and wheezing.   Cardiovascular: Negative for chest pain, palpitations and leg swelling.  Gastrointestinal: Negative for nausea, vomiting, abdominal  pain, diarrhea and blood in stool.  Genitourinary: Negative for dysuria, urgency, frequency, hematuria, flank pain and difficulty urinating.  Musculoskeletal: Negative for arthralgias, back pain, joint swelling, myalgias and neck stiffness.  Skin: Negative for pallor and rash.  Neurological: Negative for dizziness, speech difficulty, weakness and headaches.  Hematological: Negative for adenopathy. Does not bruise/bleed easily.  Psychiatric/Behavioral: Negative for confusion and sleep disturbance. The patient is not nervous/anxious.     PE; Blood pressure 133/90, pulse 66, temperature 98 F (36.7 C), temperature source Temporal, resp. rate 18, height 5\' 9"  (1.753 m), weight 179 lb (81.194 kg), SpO2 96.00%. Gen: Alert, well appearing.  Patient is oriented to person, place, time, and situation. AFFECT: pleasant, lucid thought and speech. ENT: Ears: EACs clear, normal epithelium.  TMs with good light reflex and landmarks bilaterally.  Eyes: no injection, icteris, swelling, or exudate.  EOMI, PERRLA. Nose: no drainage or turbinate edema/swelling.  No injection or focal lesion.  Mouth: lips without lesion/swelling.  Oral mucosa pink and moist.  Dentition intact and without obvious caries or gingival swelling.  Oropharynx without erythema, exudate, or swelling.  Neck: supple/nontender.  No LAD, mass, or TM.  Carotid pulses 2+ bilaterally, without bruits. CV: RRR, no m/r/g.   LUNGS: CTA bilat, nonlabored resps, good aeration in all lung fields. ABD: soft, NT, ND, BS normal.  No hepatospenomegaly or mass.  No bruits. EXT: no clubbing, cyanosis, or edema.  Musculoskeletal: no joint swelling, erythema, warmth, or tenderness.  ROM of all joints intact. Skin - no sores or suspicious lesions or rashes or color changes  Pertinent labs:  Lab Results  Component Value Date   TSH 2.64 03/06/2013   Lab Results  Component Value Date   WBC 7.4 03/06/2013   HGB 14.9 03/06/2013   HCT 42.2 03/06/2013   MCV 97.3  03/06/2013   PLT 205.0 03/06/2013   Lab Results  Component Value Date   CREATININE 0.9 03/06/2013   BUN 14 03/06/2013   NA 137 03/06/2013   K 3.9 03/06/2013   CL 106 03/06/2013   CO2 25 03/06/2013   Lab Results  Component Value Date   ALT 26 03/06/2013   AST 24 03/06/2013   ALKPHOS 66 03/06/2013   BILITOT 1.7* 03/06/2013   Lab Results  Component Value Date   CHOL 208*  03/06/2013   Lab Results  Component Value Date   HDL 41.00 03/06/2013   No results found for this basename: [facility]   Lab Results  Component Value Date   TRIG 318.0* 03/06/2013   Lab Results  Component Value Date   CHOLHDL 5 03/06/2013   Lab Results  Component Value Date   PSA 0.39 03/06/2013   PSA 0.35 03/05/2012   PSA 0.33 03/03/2009    ASSESSMENT AND PLAN:   Health maintenance examination Reviewed age and gender appropriate health maintenance issues (prudent diet, regular exercise, health risks of tobacco and excessive alcohol, use of seatbelts, fire alarms in home, use of sunscreen).  Also reviewed age and gender appropriate health screening as well as vaccine recommendations. HP labs + PSA today.  DRE normal.  Next colonoscopy 10 yrs. Zostavax rx printed. Re: HTN, he prefers no meds at this time, continue TLC.  Pt declined flu vaccine today.  An After Visit Summary was printed and given to the patient.  FOLLOW UP:  Return in about 1 year (around 03/10/2015) for annual CPE (fasting).

## 2014-03-10 ENCOUNTER — Other Ambulatory Visit: Payer: Self-pay | Admitting: Family Medicine

## 2014-03-10 ENCOUNTER — Telehealth: Payer: Self-pay | Admitting: Family Medicine

## 2014-03-10 LAB — LDL CHOLESTEROL, DIRECT: Direct LDL: 162.8 mg/dL

## 2014-03-10 MED ORDER — ATORVASTATIN CALCIUM 40 MG PO TABS: 40.0000 mg | ORAL_TABLET | Freq: Every day | ORAL | Status: DC

## 2014-03-10 NOTE — Telephone Encounter (Signed)
Relevant patient education assigned to patient using Emmi. ° °

## 2014-05-01 ENCOUNTER — Ambulatory Visit (INDEPENDENT_AMBULATORY_CARE_PROVIDER_SITE_OTHER): Payer: PRIVATE HEALTH INSURANCE | Admitting: Family Medicine

## 2014-05-01 ENCOUNTER — Encounter: Payer: Self-pay | Admitting: Family Medicine

## 2014-05-01 VITALS — BP 157/94 | HR 71 | Temp 98.1°F | Resp 16 | Ht 69.0 in | Wt 181.0 lb

## 2014-05-01 DIAGNOSIS — M6528 Calcific tendinitis, other site: Secondary | ICD-10-CM | POA: Insufficient documentation

## 2014-05-01 DIAGNOSIS — M7662 Achilles tendinitis, left leg: Secondary | ICD-10-CM

## 2014-05-01 DIAGNOSIS — M7660 Achilles tendinitis, unspecified leg: Secondary | ICD-10-CM | POA: Insufficient documentation

## 2014-05-01 NOTE — Patient Instructions (Signed)
Buy a 1/4 inch heel lift insert for your shoe. Ice the area for 20-30 min at least three times per day until redness, swelling, and pain are at least 50% improved. Continue to take 800 mg ibuprofen twice a day with food for the next 1 week. Start home exercise/stretching regimen when you feel 50% improved.

## 2014-05-01 NOTE — Progress Notes (Signed)
OFFICE NOTE  05/01/2014  CC:  Chief Complaint  Patient presents with  . Foot Swelling    left foot   . Foot Pain   HPI: Patient is a 63 y.o. Caucasian male who is here for foot pain.   Onset of pain in back of left heel 2 days ago.  No preceding trauma.  He had been in a convention recently and had to stand on concrete floor 12 hours a day.  Red at the area and tender.  Pain mainly in morning and late in day, not as bad in daytime. Ibuprofen 800mg  bid helps a little.    Pertinent PMH:  Past medical, surgical, social, and family history reviewed and no changes are noted since last office visit.  MEDS:  Outpatient Prescriptions Prior to Visit  Medication Sig Dispense Refill  . ALPRAZolam (XANAX) 0.5 MG tablet Take1 tab po qid prn stress  120 tablet  3  . Ascorbic Acid (VITAMIN C) 1000 MG tablet Take 1,000 mg by mouth daily.        Marland Kitchen aspirin 81 MG tablet Take 81 mg by mouth daily.        Marland Kitchen atorvastatin (LIPITOR) 40 MG tablet Take 1 tablet (40 mg total) by mouth daily.  30 tablet  3  . ibuprofen (ADVIL,MOTRIN) 800 MG tablet Take 1 tablet (800 mg total) by mouth every 8 (eight) hours as needed.  90 tablet  3  . ketoconazole (NIZORAL) 2 % shampoo Apply to scalp every other day as needed for scalp seborrheic dermatitis  120 mL  2  . metaxalone (SKELAXIN) 800 MG tablet 1/2-1 tab po tid prn  90 tablet  1  . Multiple Vitamin (MULTIVITAMIN) tablet Take 1 tablet by mouth daily.        . Omega-3 Fatty Acids (FISH OIL) 875 MG CAPS Take 2 capsules by mouth daily.        . pantoprazole (PROTONIX) 40 MG tablet take 1 tablet by mouth every morning  30 tablet  3  . triamcinolone cream (KENALOG) 0.1 % apply to affected area twice a day  45 g  5  . zolpidem (AMBIEN CR) 12.5 MG CR tablet take 1 tablet at bedtime if needed  30 tablet  5  . fluticasone (FLONASE) 50 MCG/ACT nasal spray instill 2 sprays into each nostril once daily if needed for allergies  16 g  5  . zoster vaccine live, PF, (ZOSTAVAX)  19400 UNT/0.65ML injection Inject 19,400 Units into the skin once.  1 vial  0  . albuterol (VENTOLIN HFA) 108 (90 BASE) MCG/ACT inhaler Inhale 2 puffs into the lungs every 4 (four) hours as needed for wheezing or shortness of breath.  1 Inhaler  0   No facility-administered medications prior to visit.    PE: Blood pressure 157/94, pulse 71, temperature 98.1 F (36.7 C), temperature source Temporal, resp. rate 16, height 5\' 9"  (1.753 m), weight 181 lb (82.101 kg), SpO2 98.00%. Gen: Alert, well appearing.  Patient is oriented to person, place, time, and situation. Left calf normal.  Posterior aspect of left heel with focal erythema, warmth, swelling, and tenderness directly over distal achilles tendon at its insertion onto calcaneus.  Pain with flexion/extension at ankle but motion is intact.  IMPRESSION AND PLAN:  Post heel pain (left), achilles bursitis vs distal achilles tendonitis. Plan: ice, continue 800 mg bid ibuprofen, 1/4 inch heel lift and "backless" shoe use for comfort. After inflammitory phase is over, home exercise program encouraged--this was reviewed  and a handout was given today to pt.  An After Visit Summary was printed and given to the patient.  FOLLOW UP: prn

## 2014-05-01 NOTE — Progress Notes (Signed)
Pre visit review using our clinic review tool, if applicable. No additional management support is needed unless otherwise documented below in the visit note. 

## 2014-05-11 ENCOUNTER — Telehealth: Payer: Self-pay | Admitting: Family Medicine

## 2014-05-11 MED ORDER — ALPRAZOLAM 0.5 MG PO TABS: ORAL_TABLET | ORAL | Status: DC

## 2014-05-11 NOTE — Telephone Encounter (Signed)
Per Dr. Anitra Lauth, okay to print RX same as last time x 5 rfs.

## 2014-05-11 NOTE — Telephone Encounter (Signed)
Pt requesting rf.  Patient last OV 05/01/14.  Last RX printed 01/01/14 x 3 rfs.  Please advise.

## 2014-05-11 NOTE — Telephone Encounter (Signed)
Agree 

## 2014-05-18 ENCOUNTER — Other Ambulatory Visit (INDEPENDENT_AMBULATORY_CARE_PROVIDER_SITE_OTHER): Payer: PRIVATE HEALTH INSURANCE

## 2014-05-18 DIAGNOSIS — E7800 Pure hypercholesterolemia, unspecified: Secondary | ICD-10-CM

## 2014-05-18 LAB — LIPID PANEL
Cholesterol: 181 mg/dL (ref 0–200)
HDL: 46.7 mg/dL (ref >39.00)
NonHDL: 134.3
Total CHOL/HDL Ratio: 4
Triglycerides: 265 mg/dL — ABNORMAL HIGH (ref 0.0–149.0)
VLDL: 53 mg/dL — ABNORMAL HIGH (ref 0.0–40.0)

## 2014-05-19 ENCOUNTER — Encounter: Payer: Self-pay | Admitting: Family Medicine

## 2014-05-19 LAB — LDL CHOLESTEROL, DIRECT: Direct LDL: 89.5 mg/dL

## 2014-06-16 DIAGNOSIS — H8110 Benign paroxysmal vertigo, unspecified ear: Secondary | ICD-10-CM

## 2014-06-16 HISTORY — DX: Benign paroxysmal vertigo, unspecified ear: H81.10

## 2014-06-17 ENCOUNTER — Encounter (HOSPITAL_BASED_OUTPATIENT_CLINIC_OR_DEPARTMENT_OTHER): Payer: Self-pay | Admitting: *Deleted

## 2014-06-17 ENCOUNTER — Telehealth: Payer: Self-pay | Admitting: Family Medicine

## 2014-06-17 ENCOUNTER — Emergency Department (HOSPITAL_BASED_OUTPATIENT_CLINIC_OR_DEPARTMENT_OTHER)
Admission: EM | Admit: 2014-06-17 | Discharge: 2014-06-17 | Disposition: A | Discharge status: Home / Self Care | Payer: Managed Care, Other (non HMO) | Attending: Emergency Medicine | Admitting: Emergency Medicine

## 2014-06-17 DIAGNOSIS — Z86010 Personal history of colonic polyps: Secondary | ICD-10-CM | POA: Insufficient documentation

## 2014-06-17 DIAGNOSIS — Z7982 Long term (current) use of aspirin: Secondary | ICD-10-CM | POA: Diagnosis not present

## 2014-06-17 DIAGNOSIS — I10 Essential (primary) hypertension: Secondary | ICD-10-CM | POA: Diagnosis not present

## 2014-06-17 DIAGNOSIS — H9312 Tinnitus, left ear: Secondary | ICD-10-CM | POA: Diagnosis not present

## 2014-06-17 DIAGNOSIS — E782 Mixed hyperlipidemia: Secondary | ICD-10-CM | POA: Diagnosis not present

## 2014-06-17 DIAGNOSIS — Z872 Personal history of diseases of the skin and subcutaneous tissue: Secondary | ICD-10-CM | POA: Diagnosis not present

## 2014-06-17 DIAGNOSIS — R42 Dizziness and giddiness: Secondary | ICD-10-CM | POA: Diagnosis not present

## 2014-06-17 DIAGNOSIS — Z79899 Other long term (current) drug therapy: Secondary | ICD-10-CM | POA: Insufficient documentation

## 2014-06-17 DIAGNOSIS — G8929 Other chronic pain: Secondary | ICD-10-CM | POA: Insufficient documentation

## 2014-06-17 DIAGNOSIS — R112 Nausea with vomiting, unspecified: Secondary | ICD-10-CM | POA: Insufficient documentation

## 2014-06-17 LAB — BASIC METABOLIC PANEL
Anion gap: 13 (ref 5–15)
BUN: 13 mg/dL (ref 6–23)
CO2: 25 mEq/L (ref 19–32)
Calcium: 9.4 mg/dL (ref 8.4–10.5)
Chloride: 104 mEq/L (ref 96–112)
Creatinine, Ser: 0.9 mg/dL (ref 0.50–1.35)
GFR calc Af Amer: >90 mL/min (ref >90)
GFR calc non Af Amer: >90 mL/min (ref >90)
Glucose, Bld: 113 mg/dL — ABNORMAL HIGH (ref 70–99)
Potassium: 3.9 mEq/L (ref 3.7–5.3)
Sodium: 142 mEq/L (ref 137–147)

## 2014-06-17 LAB — CBC WITH DIFFERENTIAL/PLATELET
Basophils Absolute: 0 10*3/uL (ref 0.0–0.1)
Basophils Relative: 0 % (ref 0–1)
Eosinophils Absolute: 0.3 10*3/uL (ref 0.0–0.7)
Eosinophils Relative: 4 % (ref 0–5)
HCT: 43.4 % (ref 39.0–52.0)
Hemoglobin: 15.4 g/dL (ref 13.0–17.0)
Lymphocytes Relative: 25 % (ref 12–46)
Lymphs Abs: 1.9 10*3/uL (ref 0.7–4.0)
MCH: 33.1 pg (ref 26.0–34.0)
MCHC: 35.5 g/dL (ref 30.0–36.0)
MCV: 93.3 fL (ref 78.0–100.0)
Monocytes Absolute: 0.6 10*3/uL (ref 0.1–1.0)
Monocytes Relative: 8 % (ref 3–12)
Neutro Abs: 4.8 10*3/uL (ref 1.7–7.7)
Neutrophils Relative %: 63 % (ref 43–77)
Platelets: 210 10*3/uL (ref 150–400)
RBC: 4.65 MIL/uL (ref 4.22–5.81)
RDW: 12.3 % (ref 11.5–15.5)
WBC: 7.6 10*3/uL (ref 4.0–10.5)

## 2014-06-17 MED ORDER — MECLIZINE HCL 25 MG PO TABS: 25.0000 mg | ORAL_TABLET | Freq: Three times a day (TID) | ORAL | Status: DC | PRN

## 2014-06-17 MED ORDER — DIAZEPAM 5 MG/ML IJ SOLN
5.0000 mg | Freq: Once | INTRAMUSCULAR | Status: AC
  Administered 2014-06-17: 5 mg via INTRAVENOUS
  Filled 2014-06-17: qty 2

## 2014-06-17 MED ORDER — MECLIZINE HCL 25 MG PO TABS
25.0000 mg | ORAL_TABLET | Freq: Once | ORAL | Status: AC
  Administered 2014-06-17: 25 mg via ORAL
  Filled 2014-06-17: qty 1

## 2014-06-17 NOTE — Telephone Encounter (Signed)
With pt's description of symptoms, I agree with the triage to the ED and if he cannot even stand to walk then he needs to call 911.  There is no med that available that treats this type of dizziness/vertigo.  An office visit with this severity of symptoms would not be appropriate.-thx

## 2014-06-17 NOTE — ED Notes (Signed)
Patient states he began having vertigo yesterday around lunchtime. Spoke with PCP this morning and was told to come to the ER. States he has had this happen before and had an ear infection at that time.

## 2014-06-17 NOTE — Telephone Encounter (Signed)
Patient Information:  Caller Name: Chipper  Phone: [phone]  Patient: Keith Sanders, Keith Sanders  Gender: Male  DOB: 11/27/1950  Age: 63 Years  PCP: Ricardo Jericho [facility])  Office Follow Up:  Does the office need to follow up with this patient?: Yes  Instructions For The Office: Please call and follow-up with pt.   Symptoms  Reason For Call & Symptoms: Pt has dizziness/nausea since yesterday. If he lays on the left hand he is ok. He can't sit up or crawl or walk. The only position he can be in is on the left hand side. If he moves the room spins severely. He did vomit x 1 yesterday. He feels he cannot come in because of the dizziness - he has a hard time even getting to the commode. He did have an incident of vertigo 8 years ago and it was the same symptoms. He was sent to the ENT and diagnosed with vertigo. He is asking if MD could prescribe something for vertigo so he could get up/then he could come into the office. He has no pain or any other symptoms.  Reviewed Health History In EMR: Yes  Reviewed Medications In EMR: Yes  Reviewed Allergies In EMR: Yes  Reviewed Surgeries / Procedures: Yes  Date of Onset of Symptoms: 06/16/2014  Guideline(s) Used:  Dizziness  Disposition Per Guideline:   Go to ED Now (or to Office with PCP Approval)  Reason For Disposition Reached:   Severe dizziness (e.g., unable to stand, requires support to walk, feels like passing out now)  Advice Given:  N/A  Patient Refused Recommendation:  Patient Requests Prescription  He is requesting a medication be called in if possible. Pt uses [facility] on [address].

## 2014-06-17 NOTE — Telephone Encounter (Signed)
Please advise 

## 2014-06-17 NOTE — Telephone Encounter (Signed)
Patient aware of advise, he will go to ED.

## 2014-06-17 NOTE — ED Provider Notes (Signed)
CSN: [id number]     Arrival date & time 06/17/14  1002 History   First MD Initiated Contact with Patient 06/17/14 1014     Chief Complaint  Patient presents with  . Dizziness     (Consider location/radiation/quality/duration/timing/severity/associated sxs/prior Treatment) Patient is a 63 y.o. male presenting with dizziness.  Dizziness Quality:  Vertigo and room spinning Severity:  Severe Onset quality:  Gradual Duration:  24 hours Timing:  Constant Progression:  Worsening Chronicity:  Recurrent (similar symptoms once, ten years ago) Context: head movement   Context comment:  Sitting up Relieved by: Lying still. Exacerbated by: Sitting up, turning head. 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 Chronic insomnia   . Chronic lumbar pain     DDD; ibuprofen, occasional muscle relaxers.  MRI L-spine 2007 showed some bulging discs with nerve impingement.  Has never had back injection or surgery.  . Contact dermatitis and eczema   . PUD (peptic ulcer disease)   . Diverticulitis   . Anxiety   . Gout     "always right big toe"--about one episode every 4 yrs  . History of adenomatous polyp of colon 2005  . HTN (hypertension)   . History of bronchitis 08/11/2012  . Hyperlipidemia, mixed    Past Surgical History  Procedure Laterality Date  . Colonoscopy w/ polypectomy  2005    Dr. Lyla Son  . Colonoscopy  07/2013    Normal.  Recall 10 yrs.   Family History  Problem Relation Age of Onset  . Heart attack Father 10  . Heart disease Father     MI age 45  . Heart disease Other   . Colon cancer Neg Hx    History  Substance Use Topics  . Smoking status: Never Smoker   . Smokeless tobacco: Never Used  . 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   Gastrointestinal: Positive for  nausea and vomiting.  Neurological: Positive for dizziness.  All other systems reviewed and are negative.     Allergies  Review of patient's allergies indicates no known allergies.  Home Medications   Prior to Admission medications   Medication Sig Start Date End Date Taking? Authorizing Provider  ALPRAZolam Duanne Moron) 0.5 MG tablet Take1 tab po qid prn stress 05/11/14   Tammi Sou, MD  Ascorbic Acid (VITAMIN C) 1000 MG tablet Take 1,000 mg by mouth daily.      Historical Provider, MD  aspirin 81 MG tablet Take 81 mg by mouth daily.      Historical Provider, MD  atorvastatin (LIPITOR) 40 MG tablet Take 1 tablet (40 mg total) by mouth daily. 03/10/14   Tammi Sou, MD  fluticasone (FLONASE) 50 MCG/ACT nasal spray instill 2 sprays into each nostril once daily if needed for allergies 03/26/13   Tammi Sou, MD  ibuprofen (ADVIL,MOTRIN) 800 MG tablet Take 1 tablet (800 mg total) by mouth every 8 (eight) hours as needed. 11/07/13   Tammi Sou, MD  ketoconazole (NIZORAL) 2 % shampoo Apply to scalp every other day as needed for scalp seborrheic dermatitis 03/06/13   Tammi Sou, MD  metaxalone [facility]) 800 MG tablet 1/2-1 tab po tid prn 03/05/12   Tammi Sou, MD  Multiple Vitamin (MULTIVITAMIN) tablet Take  1 tablet by mouth daily.      Historical Provider, MD  Omega-3 Fatty Acids (FISH OIL) 875 MG CAPS Take 2 capsules by mouth daily.      Historical Provider, MD  pantoprazole (PROTONIX) 40 MG tablet take 1 tablet by mouth every morning 12/26/13   Tammi Sou, MD  triamcinolone cream (KENALOG) 0.1 % apply to affected area twice a day 11/12/13   Tammi Sou, MD  zolpidem (AMBIEN CR) 12.5 MG CR tablet take 1 tablet at bedtime if needed 12/25/13   Tammi Sou, MD  zoster vaccine live, PF, (ZOSTAVAX) 83094 UNT/0.65ML injection Inject 19,400 Units into the skin once. 03/09/14   Tammi Sou, MD   BP 156/79 mmHg  Pulse 62  Temp(Src) 98.1 F (36.7 C) (Oral)   Resp 16  SpO2 100% Physical Exam  Constitutional: He is oriented to person, place, and time. He appears well-developed and well-nourished. No distress.  HENT:  Head: Normocephalic and atraumatic.  Mouth/Throat: Oropharynx is clear and moist.  Eyes: Conjunctivae are normal. Pupils are equal, round, and reactive to light. No scleral icterus.  Neck: Neck supple.  Cardiovascular: Normal rate, regular rhythm, normal heart sounds and intact distal pulses.   No murmur heard. Pulmonary/Chest: Effort normal and breath sounds normal. No stridor. No respiratory distress. He has no wheezes. He has no rales.  Abdominal: Soft. He exhibits no distension. There is no tenderness.  Musculoskeletal: Normal range of motion. He exhibits no edema.  Neurological: He is alert and oriented to person, place, and time. He has normal strength. No cranial nerve deficit or sensory deficit. Coordination (Normal finger to nose) normal. Abnormal gait: Unable to test. GCS eye subscore is 4. GCS verbal subscore is 5. GCS motor subscore is 6.  No nystagmus  Skin: Skin is warm and dry. No rash noted.  Psychiatric: He has a normal mood and affect. His behavior is normal.  Nursing note and vitals reviewed.   ED Course  Procedures (including critical care time) Labs Review Labs Reviewed  BASIC METABOLIC PANEL - Abnormal; Notable for the following:    Glucose, Bld 113 (*)    All other components within normal limits  CBC WITH DIFFERENTIAL    Imaging Review No results found.   EKG Interpretation None      MDM   Final diagnoses:  Vertigo    63 year old male with vertigo, starting yesterday. Unable to obtain complete neuro exam due to severe symptoms. However, most of neuro exam was reassuring.  Plan symptomatic control and recheck.  Patient felt much better after meclizine, but symptoms were not completely gone. Given Valium as well.  After medication, patient was able to sit up and ambulate without ataxia.  All in all, his neuro exam was fair reassuring with no deficits. He had no nystagmus. He had a normal test of skin. His symptoms were felt to be secondary to peripheral vertigo. He was discharged with meclizine and close primary care follow-up.  Artis Delay, MD 06/17/14 [phone]

## 2014-06-17 NOTE — Discharge Instructions (Signed)

## 2014-06-18 ENCOUNTER — Ambulatory Visit: Payer: PRIVATE HEALTH INSURANCE | Admitting: Family Medicine

## 2014-06-19 ENCOUNTER — Ambulatory Visit (INDEPENDENT_AMBULATORY_CARE_PROVIDER_SITE_OTHER): Payer: PRIVATE HEALTH INSURANCE | Admitting: Family Medicine

## 2014-06-19 ENCOUNTER — Encounter: Payer: Self-pay | Admitting: Family Medicine

## 2014-06-19 VITALS — BP 132/92 | HR 88 | Temp 98.1°F | Resp 18 | Ht 69.0 in | Wt 181.0 lb

## 2014-06-19 DIAGNOSIS — H8110 Benign paroxysmal vertigo, unspecified ear: Secondary | ICD-10-CM

## 2014-06-19 MED ORDER — ZOLPIDEM TARTRATE ER 12.5 MG PO TBCR: EXTENDED_RELEASE_TABLET | ORAL | Status: DC

## 2014-06-19 MED ORDER — METAXALONE 800 MG PO TABS: ORAL_TABLET | ORAL | Status: DC

## 2014-06-19 NOTE — Progress Notes (Signed)
Pre visit review using our clinic review tool, if applicable. No additional management support is needed unless otherwise documented below in the visit note. 

## 2014-06-19 NOTE — Patient Instructions (Signed)
Buy a metatarsal pad insert for your shoe (right foot)

## 2014-06-19 NOTE — Progress Notes (Signed)
OFFICE NOTE  06/19/2014  CC:  Chief Complaint  Patient presents with  . Hospitalization Follow-up  . Medication Refill     HPI: Patient is a 63 y.o. Caucasian male who is here for recent vertigo illness. Three days ago, acute onset of room spinning, nausea.  Could not even sit up for 10 seconds.  Sx's were present if pt tried to move.  While lying back in bed he was fine.  No fever or diarrhea.  He went to ED 06/17/14 and neuro exam was benign and he responded well to oral meclizine and iv diazepam 5mg  and was able to ambulate w/out feeling the vertigo before he left the ED.  Yesterday he had onset of the sx's again, took a xanax and his sxs' soon resolved.  The symptoms have not returned since that time. He recalls a remote hx of this, some unilateral hearing loss assoc with this per his report.  He has done a lot of up and down motion with leaf blowing the last 2 wks.  Pertinent PMH:  Past medical, surgical, social, and family history reviewed and no changes are noted since last office visit.  MEDS:  Outpatient Prescriptions Prior to Visit  Medication Sig Dispense Refill  . ALPRAZolam (XANAX) 0.5 MG tablet Take1 tab po qid prn stress 120 tablet 5  . Ascorbic Acid (VITAMIN C) 1000 MG tablet Take 1,000 mg by mouth daily.      Marland Kitchen aspirin 81 MG tablet Take 81 mg by mouth daily.      Marland Kitchen atorvastatin (LIPITOR) 40 MG tablet Take 1 tablet (40 mg total) by mouth daily. 30 tablet 3  . fluticasone (FLONASE) 50 MCG/ACT nasal spray instill 2 sprays into each nostril once daily if needed for allergies 16 g 5  . ibuprofen (ADVIL,MOTRIN) 800 MG tablet Take 1 tablet (800 mg total) by mouth every 8 (eight) hours as needed. 90 tablet 3  . ketoconazole (NIZORAL) 2 % shampoo Apply to scalp every other day as needed for scalp seborrheic dermatitis 120 mL 2  . meclizine (ANTIVERT) 25 MG tablet Take 1 tablet (25 mg total) by mouth 3 (three) times daily as needed for dizziness. 20 tablet 0  . metaxalone  (SKELAXIN) 800 MG tablet 1/2-1 tab po tid prn 90 tablet 1  . Multiple Vitamin (MULTIVITAMIN) tablet Take 1 tablet by mouth daily.      . Omega-3 Fatty Acids (FISH OIL) 875 MG CAPS Take 2 capsules by mouth daily.      . pantoprazole (PROTONIX) 40 MG tablet take 1 tablet by mouth every morning 30 tablet 3  . triamcinolone cream (KENALOG) 0.1 % apply to affected area twice a day 45 g 5  . zolpidem (AMBIEN CR) 12.5 MG CR tablet take 1 tablet at bedtime if needed 30 tablet 5  . zoster vaccine live, PF, (ZOSTAVAX) 42706 UNT/0.65ML injection Inject 19,400 Units into the skin once. (Patient not taking: Reported on 06/19/2014) 1 vial 0   No facility-administered medications prior to visit.    PE: Blood pressure 132/92, pulse 88, temperature 98.1 F (36.7 C), temperature source Temporal, resp. rate 18, height 5\' 9"  (1.753 m), weight 181 lb (82.101 kg), SpO2 97 %. Gen: Alert, well appearing.  Patient is oriented to person, place, time, and situation. Neuro: CN 2-12 intact bilaterally, strength 5/5 in proximal and distal upper extremities and lower extremities bilaterally.  No sensory deficits.  No tremor.  No disdiadochokinesis.  No ataxia.  Upper extremity and lower extremity DTRs  symmetric.  No pronator drift.   IMPRESSION AND PLAN:  BPPV, resolved. He has home epleys exercises already, discussed doing these right away if sx's return in future. Also, he seems to respond well to benzo and/or meclizine for this, so he has this on hand for future use. Signs/symptoms to call or return for were reviewed and pt expressed understanding.  An After Visit Summary was printed and given to the patient.  FOLLOW UP: prn

## 2014-07-06 ENCOUNTER — Other Ambulatory Visit: Payer: Self-pay

## 2014-07-06 MED ORDER — ATORVASTATIN CALCIUM 40 MG PO TABS: 40.0000 mg | ORAL_TABLET | Freq: Every day | ORAL | Status: DC

## 2014-07-08 ENCOUNTER — Telehealth: Payer: Self-pay | Admitting: Family Medicine

## 2014-07-08 MED ORDER — PREDNISONE 20 MG PO TABS: ORAL_TABLET | ORAL | Status: DC

## 2014-07-08 NOTE — Telephone Encounter (Signed)
Pls call pt and let him know I'll send in colchicine or prednisone, whichever one he prefers.  Let me know.--thx

## 2014-07-08 NOTE — Telephone Encounter (Signed)
Patient's wife notified. Wife stated that we should give pt the med we gave him last time.

## 2014-07-08 NOTE — Telephone Encounter (Signed)
Received fax from [facility]- patient is requesting a CB from Dr. Idelle Leech assistant. No reason given.

## 2014-07-08 NOTE — Telephone Encounter (Signed)
Please advise 

## 2014-07-08 NOTE — Telephone Encounter (Signed)
Prednisone 40mg  qd x 5d sent to [facility] on [address] in [place]. However, when I called pt to let him know he said he already went to an urgent care in [place], [place]. I told him I'll leave the prednisone rx at his pharmacy for him to use prn future flare of gout.  He could not come to me for this flare b/c of being an hour away and busy with work.   He also seemed to have some trouble with our call-a-nurse service returning his calls in a timely manner.

## 2014-07-08 NOTE — Telephone Encounter (Signed)
New incoming message from [facility]. Patient has a flare up of gout in L leg onset 2 days ago. Hot foot and swelling. Please send in Rx.

## 2014-07-16 ENCOUNTER — Other Ambulatory Visit: Payer: Self-pay | Admitting: Family Medicine

## 2014-07-16 MED ORDER — PANTOPRAZOLE SODIUM 40 MG PO TBEC: DELAYED_RELEASE_TABLET | ORAL | Status: DC

## 2014-07-17 DIAGNOSIS — Z8619 Personal history of other infectious and parasitic diseases: Secondary | ICD-10-CM

## 2014-07-17 HISTORY — DX: Personal history of other infectious and parasitic diseases: Z86.19

## 2014-09-15 ENCOUNTER — Other Ambulatory Visit: Payer: Self-pay | Admitting: *Deleted

## 2014-09-15 NOTE — Telephone Encounter (Signed)
Refill request for Ibuprofen  Last filled by MD on- 11/07/2013 Last Appt: 06/19/2014 Next Appt: 03/15/2015 Please advise refill?

## 2014-09-16 MED ORDER — IBUPROFEN 800 MG PO TABS: 800.0000 mg | ORAL_TABLET | Freq: Three times a day (TID) | ORAL | Status: DC | PRN

## 2014-09-22 ENCOUNTER — Ambulatory Visit (INDEPENDENT_AMBULATORY_CARE_PROVIDER_SITE_OTHER): Payer: Managed Care, Other (non HMO) | Admitting: Nurse Practitioner

## 2014-09-22 ENCOUNTER — Encounter: Payer: Self-pay | Admitting: Nurse Practitioner

## 2014-09-22 ENCOUNTER — Telehealth: Payer: Self-pay | Admitting: Nurse Practitioner

## 2014-09-22 VITALS — BP 118/81 | HR 99 | Temp 98.6°F | Ht 69.0 in | Wt 174.0 lb

## 2014-09-22 DIAGNOSIS — R197 Diarrhea, unspecified: Secondary | ICD-10-CM

## 2014-09-22 LAB — COMPREHENSIVE METABOLIC PANEL
ALT: 24 U/L (ref 0–53)
AST: 16 U/L (ref 0–37)
Albumin: 4.3 g/dL (ref 3.5–5.2)
Alkaline Phosphatase: 77 U/L (ref 39–117)
BUN: 8 mg/dL (ref 6–23)
CO2: 26 mEq/L (ref 19–32)
Calcium: 9.3 mg/dL (ref 8.4–10.5)
Chloride: 102 mEq/L (ref 96–112)
Creatinine, Ser: 0.93 mg/dL (ref 0.40–1.50)
GFR: 87.64 mL/min (ref >60.00)
Glucose, Bld: 92 mg/dL (ref 70–99)
Potassium: 3.9 mEq/L (ref 3.5–5.1)
Sodium: 136 mEq/L (ref 135–145)
Total Bilirubin: 1.3 mg/dL — ABNORMAL HIGH (ref 0.2–1.2)
Total Protein: 6.6 g/dL (ref 6.0–8.3)

## 2014-09-22 LAB — SEDIMENTATION RATE: Sed Rate: 43 mm/hr — ABNORMAL HIGH (ref 0–22)

## 2014-09-22 LAB — CBC WITH DIFFERENTIAL/PLATELET
Basophils Absolute: 0 10*3/uL (ref 0.0–0.1)
Basophils Relative: 0.2 % (ref 0.0–3.0)
Eosinophils Absolute: 0.1 10*3/uL (ref 0.0–0.7)
Eosinophils Relative: 0.8 % (ref 0.0–5.0)
HCT: 44.3 % (ref 39.0–52.0)
Hemoglobin: 15.4 g/dL (ref 13.0–17.0)
Lymphocytes Relative: 10.2 % — ABNORMAL LOW (ref 12.0–46.0)
Lymphs Abs: 1.8 10*3/uL (ref 0.7–4.0)
MCHC: 34.7 g/dL (ref 30.0–36.0)
MCV: 95.4 fl (ref 78.0–100.0)
Monocytes Absolute: 1 10*3/uL (ref 0.1–1.0)
Monocytes Relative: 5.7 % (ref 3.0–12.0)
Neutro Abs: 14.4 10*3/uL — ABNORMAL HIGH (ref 1.4–7.7)
Neutrophils Relative %: 83.1 % — ABNORMAL HIGH (ref 43.0–77.0)
Platelets: 309 10*3/uL (ref 150.0–400.0)
RBC: 4.65 Mil/uL (ref 4.22–5.81)
RDW: 12.9 % (ref 11.5–15.5)
WBC: 17.4 10*3/uL — ABNORMAL HIGH (ref 4.0–10.5)

## 2014-09-22 LAB — POCT URINALYSIS DIPSTICK
Bilirubin, UA: NEGATIVE
Blood, UA: NEGATIVE
Glucose, UA: NEGATIVE
Ketones, UA: NEGATIVE
Leukocytes, UA: NEGATIVE
Nitrite, UA: NEGATIVE
Protein, UA: NEGATIVE
Spec Grav, UA: <=1.005
Urobilinogen, UA: 0.2
pH, UA: 5.5

## 2014-09-22 MED ORDER — METRONIDAZOLE 500 MG PO TABS
500.0000 mg | ORAL_TABLET | Freq: Two times a day (BID) | ORAL | Status: DC
Start: 2014-09-22 — End: 2014-09-22

## 2014-09-22 MED ORDER — METRONIDAZOLE 500 MG PO TABS: 500.0000 mg | ORAL_TABLET | Freq: Two times a day (BID) | ORAL | Status: DC

## 2014-09-22 MED ORDER — FIDAXOMICIN 200 MG PO TABS: 200.0000 mg | ORAL_TABLET | Freq: Two times a day (BID) | ORAL | Status: DC

## 2014-09-22 NOTE — Progress Notes (Signed)
Subjective:     Keith Sanders is a 64 y.o. male who presents for evaluation of diarrhea. Onset of diarrhea was 3 weeks ago after taking augmentin for 4 days for "sinus infection". Diarrhea is occurring approximately 20 times per day. Patient describes diarrhea as watery. Diarrhea has been associated with fever yesterday, stool incontinence, recent antibiotic therapy augmentin and possibly doxycycline. (Pt didn't mention doxy, but med reconciliation shows doxy was prescribed in last 30 days.)  Patient is having rectal burning from diarrhea-doesn't want to eat due to skin irritation.  Denies recent camping, recent travel, significant abdominal pain, N, V. Previous visits for diarrhea: none. Evaluation to date: phone conversation at another facility reporting diarrhea. Treatment to date: metronidazole 7 days. Pt stopped med due to made him feel "irritable, not myself".  Metronidazole stopped diarrhea after about 4 days. Pt was well for 2 weeks, but symptoms returned 3 da.   The following portions of the patient's history were reviewed and updated as appropriate: allergies, current medications, past medical history, past social history, past surgical history and problem list.  Review of Systems Constitutional: negative for fatigue Respiratory: negative for cough Cardiovascular: negative for irregular heart beat Gastrointestinal: negative for abdominal pain   Mouth: denies sore throat Neuro: denies HA Objective:    BP 118/81 mmHg  Pulse 99  Temp(Src) 98.6 F (37 C) (Temporal)  Ht 5\' 9"  (1.753 m)  Wt 174 lb (78.926 kg)  BMI 25.68 kg/m2  SpO2 97% General: alert, cooperative, appears stated age and no distress  Hydration:  well hydrated  Abdomen: Lungs: Heart: Eyes: Neuro:    soft, non-tender; bowel sounds normal; no masses,  no organomegaly  Clear bilat Rrr, no murmur Lids & lashes clear, conj clear Grossly nml    Assessment:Plan   1. Diarrhea DD: c-diff, giardiasis, diverticulitis -  CBC with Differential/Platelet - Comprehensive metabolic panel - Sedimentation rate - Urine culture - POCT urinalysis dipstick - Stool culture - Giardia/cryptosporidium (EIA) - Stool, WBC/Lactoferrin - Stool C-Diff Toxin Assay - fidaxomicin (DIFICID) 200 MG TABS tablet; Take 1 tablet (200 mg total) by mouth 2 (two) times daily.  Dispense: 20 tablet; Refill: 0 See pt instructions for diet changes desitin or A&D oint w/zince for rectal irritation F/u 2 weeks

## 2014-09-22 NOTE — Telephone Encounter (Signed)
pls call pt: Advise I changed script to different ABX. He will not take metronidazole. He will take fidaxomicin.

## 2014-09-22 NOTE — Progress Notes (Signed)
Pre visit review using our clinic review tool, if applicable. No additional management support is needed unless otherwise documented below in the visit note. 

## 2014-09-22 NOTE — Patient Instructions (Signed)
Start metronidazole. Let us know if you have major mood changes. Eat 1 carton yogurt daily. Continue bland soft diet-scrambled egg, steamed vegetables, soft fruits-pear, berries, melon, peaches, brown rice.  Drink gatorade or powerade-8 oz for every loose stool. See [place] in 2 weeks. We will call with lab results.   Clostridium Difficile Infection Clostridium difficile (C. difficile) is a bacteria found in the intestinal tract or colon. Under certain conditions, it causes diarrhea and sometimes severe disease. The severe form of the disease is known as pseudomembranous colitis (often called C. difficile colitis). This disease can damage the lining of the colon or cause the colon to become enlarged (toxic megacolon). CAUSES Your colon normally contains many different bacteria, including C. difficile. The balance of bacteria in your colon can change during illness. This is especially true when you take antibiotic medicine. Taking antibiotics may allow the C. difficile to grow, multiply excessively, and make a toxin that then causes illness. The elderly and people with certain medical conditions have a greater risk of getting C. difficile infections. SYMPTOMS  Watery diarrhea.  Fever.  Fatigue.  Loss of appetite.  Nausea.  Abdominal swelling, pain, or tenderness.  Dehydration. DIAGNOSIS Your symptoms may make your caregiver suspect a C. difficile infection, especially if you have used antibiotics in the preceding weeks. However, there are only 2 ways to know for certain whether you have a C. difficile infection:  A lab test that finds the toxin in your stool.  The specific appearance of an abnormality (pseudomembrane) in your colon. This can only be seen by doing a sigmoidoscopy or colonoscopy. These procedures involve passing an instrument through your rectum to look at the inside of your colon. Your caregiver will help determine if these tests are necessary. TREATMENT  Most people are  successfully treated with one of two specific antibiotics, usually given by mouth. Other antibiotics you are receiving are stopped if possible.  Intravenous (IV) fluids and correction of electrolyte imbalance may be necessary.  Rarely, surgery may be needed to remove the infected part of the intestines.  Careful hand washing by you and your caregivers is important to prevent the spread of infection. In the hospital, your caregivers may also put on gowns and gloves to prevent the spread of the C. difficile bacteria. Your room is also cleaned regularly with a solution containing bleach or a product that is known to kill C. difficile. HOME CARE INSTRUCTIONS  Drink enough fluids to keep your urine clear or pale yellow. Avoid milk, caffeine, and alcohol.  Ask your caregiver for specific rehydration instructions.  Try eating small, frequent meals rather than large meals.  Take your antibiotics as directed. Finish them even if you start to feel better.  Do not use medicines to slow diarrhea. This could delay healing or cause complications.  Wash your hands thoroughly after using the bathroom and before preparing food.  Make sure people who live with you wash their hands often, too.  Carefully disinfect all surfaces with a product that contains chlorine bleach. SEEK MEDICAL CARE IF:  Diarrhea persists longer than expected or recurs after completing your course of antibiotic treatment for the C. difficile infection.  You have trouble staying hydrated. SEEK IMMEDIATE MEDICAL CARE IF:  You develop a new fever.  You have increasing abdominal pain or tenderness.  There is blood in your stools, or your stools are dark black and tarry.  You cannot hold down food or liquids. MAKE SURE YOU:  Understand these instructions.  Will watch your condition.  Will get help right away if you are not doing well or get worse. Document Released: 04/12/2005 Document Revised: 11/17/2013 Document  Reviewed: 12/09/2010 [facility] Patient Information 2015 [place], [place]. This information is not intended to replace advice given to you by your health care provider. Make sure you discuss any questions you have with your health care provider.

## 2014-09-22 NOTE — Telephone Encounter (Signed)
Called and informed patient of med change. Also let him know that it needs a prior auth with his insurance and that I have submitted it and will let him know what they say when we get it. Patient agrees

## 2014-09-23 ENCOUNTER — Other Ambulatory Visit: Payer: Self-pay | Admitting: Nurse Practitioner

## 2014-09-24 LAB — C. DIFFICILE GDH AND TOXIN A/B
C. difficile GDH: DETECTED — AB
C. difficile Toxin A/B: NOT DETECTED

## 2014-09-24 LAB — GIARDIA/CRYPTOSPORIDIUM (EIA)
Cryptosporidium Screen (EIA): NEGATIVE
Giardia Screen (EIA): NEGATIVE

## 2014-09-24 NOTE — Telephone Encounter (Signed)
I haven't gotten anything back so far about his prior auth. I have called Christella Scheuermann and made it urgent prior auth, he said I should hear about approval either today or tomorrow.

## 2014-09-24 NOTE — Telephone Encounter (Signed)
pls call pt: Advise He does have c.difficile infection. He is on appropriate treatment. He should begin taking probiotic-Align, 1 capsule daily. He should take for at least 1 month. Ask if feeling better. Keep appt w/Dr Marcille Buffy this month.

## 2014-09-25 LAB — CLOSTRIDIUM DIFFICILE BY PCR: Toxigenic C. Difficile by PCR: DETECTED — CR

## 2014-09-25 LAB — FECAL LACTOFERRIN, QUANT: Lactoferrin: POSITIVE

## 2014-09-25 NOTE — Telephone Encounter (Signed)
Called and informed patient. 

## 2014-09-26 LAB — URINE CULTURE: Colony Count: 10000

## 2014-09-27 LAB — STOOL CULTURE

## 2014-10-05 ENCOUNTER — Encounter: Payer: Self-pay | Admitting: Family Medicine

## 2014-10-05 ENCOUNTER — Telehealth: Payer: Self-pay | Admitting: Family Medicine

## 2014-10-05 ENCOUNTER — Other Ambulatory Visit: Payer: Self-pay | Admitting: Family Medicine

## 2014-10-05 ENCOUNTER — Ambulatory Visit (INDEPENDENT_AMBULATORY_CARE_PROVIDER_SITE_OTHER): Payer: Managed Care, Other (non HMO) | Admitting: Family Medicine

## 2014-10-05 VITALS — BP 150/90 | HR 83 | Temp 97.2°F | Resp 18 | Ht 69.0 in | Wt 178.0 lb

## 2014-10-05 DIAGNOSIS — A0472 Enterocolitis due to Clostridium difficile, not specified as recurrent: Secondary | ICD-10-CM

## 2014-10-05 DIAGNOSIS — A047 Enterocolitis due to Clostridium difficile: Secondary | ICD-10-CM

## 2014-10-05 MED ORDER — VANCOMYCIN HCL 125 MG PO CAPS: 125.0000 mg | ORAL_CAPSULE | Freq: Four times a day (QID) | ORAL | Status: DC

## 2014-10-05 NOTE — Progress Notes (Signed)
Pre visit review using our clinic review tool, if applicable. No additional management support is needed unless otherwise documented below in the visit note. 

## 2014-10-05 NOTE — Telephone Encounter (Signed)
Please advise 

## 2014-10-05 NOTE — Progress Notes (Signed)
OFFICE NOTE  10/05/2014  CC:  Chief Complaint  Patient presents with  . Follow-up    HPI: Patient is a 64 y.o. Caucasian male who is here for f/u C diff colitis.   Took total of 15 d of abx for this between flagyl and dificid.  He started a digestive probiotic yesterday. His appetite is back to normal.  No fevers or n/v or abd pain.  Pertinent PMH:  Past medical, surgical, social, and family history reviewed and no changes are noted since last office visit.  MEDS:  Outpatient Prescriptions Prior to Visit  Medication Sig Dispense Refill  . ALPRAZolam (XANAX) 0.5 MG tablet Take1 tab po qid prn stress 120 tablet 5  . Ascorbic Acid (VITAMIN C) 1000 MG tablet Take 1,000 mg by mouth daily.      Marland Kitchen aspirin 81 MG tablet Take 81 mg by mouth daily.      Marland Kitchen atorvastatin (LIPITOR) 40 MG tablet Take 1 tablet (40 mg total) by mouth daily. 30 tablet 3  . fluticasone (FLONASE) 50 MCG/ACT nasal spray instill 2 sprays into each nostril once daily if needed for allergies 16 g 5  . Multiple Vitamin (MULTIVITAMIN) tablet Take 1 tablet by mouth daily.      . Omega-3 Fatty Acids (FISH OIL) 875 MG CAPS Take 2 capsules by mouth daily.      . pantoprazole (PROTONIX) 40 MG tablet take 1 tablet by mouth every morning 30 tablet 3  . zolpidem (AMBIEN CR) 12.5 MG CR tablet take 1 tablet at bedtime if needed 30 tablet 5  . fidaxomicin (DIFICID) 200 MG TABS tablet Take 1 tablet (200 mg total) by mouth 2 (two) times daily. (Patient not taking: Reported on 10/05/2014) 20 tablet 0  . meclizine (ANTIVERT) 25 MG tablet Take 1 tablet (25 mg total) by mouth 3 (three) times daily as needed for dizziness. (Patient not taking: Reported on 10/05/2014) 20 tablet 0  . metaxalone (SKELAXIN) 800 MG tablet 1/2-1 tab po tid prn (Patient not taking: Reported on 10/05/2014) 90 tablet 3  . triamcinolone cream (KENALOG) 0.1 % apply to affected area twice a day (Patient not taking: Reported on 09/22/2014) 45 g 5   No facility-administered  medications prior to visit.    PE: Blood pressure 150/90, pulse 83, temperature 97.2 F (36.2 C), temperature source Temporal, resp. rate 18, height 5\' 9"  (1.753 m), weight 178 lb (80.74 kg), SpO2 98 %. Gen: Alert, well appearing.  Patient is oriented to person, place, time, and situation. AFFECT: pleasant, lucid thought and speech. No further exam today.  Lab Results  Component Value Date   TSH 2.35 03/09/2014   Lab Results  Component Value Date   WBC 17.4* 09/22/2014   HGB 15.4 09/22/2014   HCT 44.3 09/22/2014   MCV 95.4 09/22/2014   PLT 309.0 09/22/2014   Lab Results  Component Value Date   CREATININE 0.93 09/22/2014   BUN 8 09/22/2014   NA 136 09/22/2014   K 3.9 09/22/2014   CL 102 09/22/2014   CO2 26 09/22/2014   Lab Results  Component Value Date   ALT 24 09/22/2014   AST 16 09/22/2014   ALKPHOS 77 09/22/2014   BILITOT 1.3* 09/22/2014   Lab Results  Component Value Date   CHOL 181 05/18/2014   Lab Results  Component Value Date   HDL 46.70 05/18/2014   No results found for: [facility] Lab Results  Component Value Date   TRIG 265.0* 05/18/2014   Lab Results  Component Value Date   CHOLHDL 4 05/18/2014   IMPRESSION AND PLAN:  C difficile colitis: clinically resolved resolved. Continue probiotic for 1 mo. Signs/symptoms to call or return for were reviewed and pt expressed understanding.  An After Visit Summary was printed and given to the patient.   FOLLOW UP: prn

## 2014-10-05 NOTE — Telephone Encounter (Signed)
Take OTC imodium 1-2 times a day for the next 2 days and eat a BRAT diet and we'll give this 2 days to see if it spontaneously resolves.  Immediate restart of antibiotics for C diff is NOT needed.  Sometimes a slight recurrence or residual infection will not need anything other than BRAT diet and a little bit of antidiarrheal med and time. If 2 days go by and he still has watery, frequent BMs then he can fill the rx for oral vancomycin that I'll send in today.  If pt has fever or the diarrhea is now bloody then let me know.

## 2014-10-05 NOTE — Telephone Encounter (Signed)
Pt. Called and said his symptoms have returned after lunch. He has been going to the bathroom at least 7-8 times in the laasat lil while. He is eating nothing. Please call and advise. [phone]

## 2014-10-05 NOTE — Telephone Encounter (Signed)
Patient aware.   States he doesn't know if he has a fever but no blood in stools.  Patient okay with plan to begin BRAT diet and immodium.

## 2014-10-06 NOTE — Telephone Encounter (Signed)
OK. Oral vancomycin has been sent to his pharmacy and is ready for him to pick up if/when he needs it.

## 2014-10-26 ENCOUNTER — Telehealth: Payer: Self-pay | Admitting: *Deleted

## 2014-10-26 DIAGNOSIS — R197 Diarrhea, unspecified: Secondary | ICD-10-CM

## 2014-10-26 DIAGNOSIS — A0471 Enterocolitis due to Clostridium difficile, recurrent: Secondary | ICD-10-CM

## 2014-10-26 MED ORDER — VANCOMYCIN HCL 125 MG PO CAPS: 125.0000 mg | ORAL_CAPSULE | Freq: Four times a day (QID) | ORAL | Status: DC

## 2014-10-26 NOTE — Telephone Encounter (Signed)
OK, we were not given all the details initially and that's why I triaged him the way I did. I agree that 2 and 1/2 mo of diarrhea is not indicative of viral GE and he needs to be evaluated in either my office, the ED, or his gastroenterologist.

## 2014-10-26 NOTE — Telephone Encounter (Signed)
Called and informed patient. He would like to go see a gastro doctor. Dr. Deatra Ina performed his colonoscopy last year and Keith Sanders said that there would need to be another referral put in for this. I told Keith Sanders that and he was wondering if you could put a rush on it because he is getting weak because he cant eat without having diarrhea. I told him that we would try our best but that it didn't always work out in our favor.   I advised for patient to go the ED to be seen and evaluated as a gastro doctor would probably be able to see him tonight and he said that he didn't trust the ED because the last time he went for a different problem he was there for 8 hours and nothing was done about his problem.

## 2014-10-26 NOTE — Telephone Encounter (Signed)
I called and talked to pt. His nausea has resolved.  He vomited x 2 yesterday. No fever.  No bloody stool. While he took 10d course of oral vanco recently his diarrhea resolved.  Says same sx's as past C diff colitis returned just a few days after finishing the med, though (frequent, small volume, watery stool "round the clock", with waxing/waning abd cramps and "gassiness".  He is drinking lots of fluids but almost no solids. I sent in rx for more oral vanco x 10d and put in urgent GI referral.

## 2014-10-26 NOTE — Telephone Encounter (Signed)
Patient left vm stating that his diarrhea and loss of appetite returned friday. This time however, he is experiencing nausea and vomiting. Patient stated that he completed he med 8 days ago, which worked well for him. Patient stated that he felt to weak to go to work today. Patient would like advise on what he could do? Please advise?

## 2014-10-26 NOTE — Telephone Encounter (Signed)
Sounds like he has viral gastroenteritis. Focus on clear fluids constantly, can call in phenergan 12.5mg  tabs, 1-2 q6h prn nausea, #30, no RF. No OTD anti-diarrhea meds recommended unless having >6 diarrhea BMs per 24h. If bloody diarrhea starts occurring or if the illness he currently has lasts >72h, then it would be good to get stool sample for testing.  I don't recommend any treatment for C diff at this time.---thx

## 2014-10-26 NOTE — Telephone Encounter (Signed)
Called and spoke with patient. He doesn't think that this is viral considering he has been having diarrhea for about 2.5 months. He said that the vomiting and nausea has subsided, But he is have 15-20 diarrhea BM's a day. States that he will take his abx and feel better and then 72 hrs later he is feeling bad and having diarrhea again. Please Advise?

## 2014-10-28 ENCOUNTER — Telehealth: Payer: Self-pay | Admitting: Gastroenterology

## 2014-10-28 NOTE — Telephone Encounter (Signed)
Spoke with the patient. He is presently on Vancomycin. It is helping. He reports weight loss and loss of appetite. States he began to have diarrhea after taking Augmentin. Appointment made with PA for next Wednesday 11/04/14 at 1:15 pm.

## 2014-10-29 ENCOUNTER — Encounter: Payer: Self-pay | Admitting: Family Medicine

## 2014-11-04 ENCOUNTER — Other Ambulatory Visit (INDEPENDENT_AMBULATORY_CARE_PROVIDER_SITE_OTHER): Payer: PRIVATE HEALTH INSURANCE

## 2014-11-04 ENCOUNTER — Encounter: Payer: Self-pay | Admitting: Physician Assistant

## 2014-11-04 ENCOUNTER — Ambulatory Visit: Payer: Managed Care, Other (non HMO) | Admitting: Physician Assistant

## 2014-11-04 ENCOUNTER — Ambulatory Visit (INDEPENDENT_AMBULATORY_CARE_PROVIDER_SITE_OTHER): Payer: PRIVATE HEALTH INSURANCE | Admitting: Physician Assistant

## 2014-11-04 VITALS — BP 130/72 | HR 84 | Ht 69.0 in | Wt 174.1 lb

## 2014-11-04 DIAGNOSIS — A498 Other bacterial infections of unspecified site: Secondary | ICD-10-CM

## 2014-11-04 DIAGNOSIS — B9689 Other specified bacterial agents as the cause of diseases classified elsewhere: Secondary | ICD-10-CM

## 2014-11-04 LAB — CBC WITH DIFFERENTIAL/PLATELET
Basophils Absolute: 0 10*3/uL (ref 0.0–0.1)
Basophils Relative: 0.3 % (ref 0.0–3.0)
Eosinophils Absolute: 0.3 10*3/uL (ref 0.0–0.7)
Eosinophils Relative: 2.8 % (ref 0.0–5.0)
HCT: 42.3 % (ref 39.0–52.0)
Hemoglobin: 14.7 g/dL (ref 13.0–17.0)
Lymphocytes Relative: 21.6 % (ref 12.0–46.0)
Lymphs Abs: 2.6 10*3/uL (ref 0.7–4.0)
MCHC: 34.7 g/dL (ref 30.0–36.0)
MCV: 92.6 fl (ref 78.0–100.0)
Monocytes Absolute: 0.9 10*3/uL (ref 0.1–1.0)
Monocytes Relative: 7.8 % (ref 3.0–12.0)
Neutro Abs: 8.2 10*3/uL — ABNORMAL HIGH (ref 1.4–7.7)
Neutrophils Relative %: 67.5 % (ref 43.0–77.0)
Platelets: 338 10*3/uL (ref 150.0–400.0)
RBC: 4.57 Mil/uL (ref 4.22–5.81)
RDW: 12.8 % (ref 11.5–15.5)
WBC: 12.1 10*3/uL — ABNORMAL HIGH (ref 4.0–10.5)

## 2014-11-04 LAB — BASIC METABOLIC PANEL
BUN: 20 mg/dL (ref 6–23)
CO2: 29 mEq/L (ref 19–32)
Calcium: 9.5 mg/dL (ref 8.4–10.5)
Chloride: 102 mEq/L (ref 96–112)
Creatinine, Ser: 0.84 mg/dL (ref 0.40–1.50)
GFR: 98.52 mL/min (ref >60.00)
Glucose, Bld: 99 mg/dL (ref 70–99)
Potassium: 3.9 mEq/L (ref 3.5–5.1)
Sodium: 137 mEq/L (ref 135–145)

## 2014-11-04 LAB — URIC ACID: Uric Acid, Serum: 6 mg/dL (ref 4.0–7.8)

## 2014-11-04 MED ORDER — VANCOMYCIN HCL 125 MG PO CAPS: ORAL_CAPSULE | ORAL | Status: DC

## 2014-11-04 MED ORDER — SACCHAROMYCES BOULARDII 250 MG PO CAPS: ORAL_CAPSULE | ORAL | Status: DC

## 2014-11-04 NOTE — Progress Notes (Signed)
Patient ID: Keith Sanders, male   DOB: 1950-08-27, 64 y.o.   MRN: [id number]     History of Present Illness: Keith Sanders is a 64 year old male known to Dr. Deatra Ina from prior colonoscopies. Last colonoscopy was in January 2015 at which time no polyps were noted and he was advised to have surveillance in 10 years.  Keith Sanders is here today with complaints of diarrhea. In February he had several days of Augmentin for a sinus infection. After taking it for several days he developed diarrhea. His Augmentin was discontinued and he then had a 10 day course of doxycycline. He developed diarrhea and after 3 days of diarrhea called the urgent care center where he was treated for the sinusitis. A seven-day course of Flagyl was called then but no initial culture for C. difficile was done. His diarrhea stopped but after 72 hours of being off the Flagyl his diarrhea came back. He was seen at his primary care providers and prescribed assisted however his insurance wouldn't cover it and he was given a second course of Flagyl 500 mg twice daily for 7 days his diarrhea again stopped but after 3 days of being off the Flagyl his diarrhea came back. He was given vancomycin for 10 days and his diarrhea stopped. 6 days after completing the vancomycin his diarrhea came back. He was restarted on vancomycin 250 mg 4 times daily for 10 days he currently is on day 9 he is having 4 formed bowel movements daily that are more on the mushy side. He has had no bright red blood per rectum or melena. He has no abdominal pain but has waves of nausea. He also has a history of gout and has been having pain at the base of both big toes.   Past Medical History  Diagnosis Date  . Chronic insomnia   . Chronic lumbar pain     DDD; ibuprofen, occasional muscle relaxers.  MRI L-spine 2007 showed some bulging discs with nerve impingement.  Has never had back injection or surgery.  . Contact dermatitis and eczema   . PUD (peptic ulcer disease)   .  Diverticulitis   . Anxiety   . Gout     "always right big toe"--about one episode every 4 yrs  . History of adenomatous polyp of colon 2005  . HTN (hypertension)   . History of bronchitis 08/11/2012  . Hyperlipidemia, mixed   . BPPV (benign paroxysmal positional vertigo) 06/2014    Past Surgical History  Procedure Laterality Date  . Colonoscopy w/ polypectomy  2005    Dr. Lyla Sanders  . Colonoscopy  07/2013    Normal.  Recall 10 yrs.   Family History  Problem Relation Age of Onset  . Heart attack Father 16  . Heart disease Father     MI age 43  . Heart disease Other   . Colon cancer Neg Hx   . Colon polyps Mother   . Ovarian cancer Sister    History  Substance Use Topics  . Smoking status: Never Smoker   . Smokeless tobacco: Never Used  . Alcohol Use: 8.4 oz/week    14 Cans of beer per week   Current Outpatient Prescriptions  Medication Sig Dispense Refill  . ALPRAZolam (XANAX) 0.5 MG tablet Take1 tab po qid prn stress 120 tablet 5  . Ascorbic Acid (VITAMIN C) 1000 MG tablet Take 1,000 mg by mouth daily.      Marland Kitchen aspirin 81 MG tablet Take 81 mg  by mouth daily.      Marland Kitchen atorvastatin (LIPITOR) 40 MG tablet Take 1 tablet (40 mg total) by mouth daily. 30 tablet 3  . fluticasone (FLONASE) 50 MCG/ACT nasal spray instill 2 sprays into each nostril once daily if needed for allergies 16 g 5  . Multiple Vitamin (MULTIVITAMIN) tablet Take 1 tablet by mouth daily.      . Omega-3 Fatty Acids (FISH OIL) 875 MG CAPS Take 2 capsules by mouth daily.      . pantoprazole (PROTONIX) 40 MG tablet take 1 tablet by mouth every morning 30 tablet 3  . triamcinolone cream (KENALOG) 0.1 % apply to affected area twice a day 45 g 5  . vancomycin (VANCOCIN) 125 MG capsule Take as directed. 46 capsule 0  . zolpidem (AMBIEN CR) 12.5 MG CR tablet take 1 tablet at bedtime if needed 30 tablet 5  . metaxalone (SKELAXIN) 800 MG tablet 1/2-1 tab po tid prn (Patient not taking: Reported on 11/04/2014) 90 tablet 3    . saccharomyces boulardii (FLORASTOR) 250 MG capsule Take 2 capsules twice daily. 120 capsule 2   No current facility-administered medications for this visit.   Allergies  Allergen Reactions  . Augmentin [Amoxicillin-Pot Clavulanate] Diarrhea    Upset stomach      Review of Systems: Per history of present illness otherwise negative  LAB RESULTS: Stool for C. difficile on 09/23/2014 was positive     Physical Exam: General: Pleasant, well developed male in no acute distress Head: Normocephalic and atraumatic Eyes:  sclerae anicteric, conjunctiva pink  Ears: Normal auditory acuity Lungs: Clear throughout to auscultation Heart: Regular rate and rhythm Abdomen: Soft, non distended, non-tender. No masses, no hepatomegaly. Normal bowel sounds Musculoskeletal: Symmetrical with no gross deformities bilateral feet with erythema, swelling, and tenderness at the first metatarsal phalangeal joint. Increased intake with extension of great toe. No red streaks up feet. Extremities: No edema  Neurological: Alert oriented x 4, grossly nonfocal Psychological:  Alert and cooperative. 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 Patient has had improvement while on vancomycin but recommend a longer course with a taper since this is his second relapse. Patient is to continue vancomycin 125 mg 4 times daily for 4 more days, then 125 mg orally twice daily for 7 days, then 125 mg orally once daily for 7 days, then 125 mg orally every other day for 7 days, then 125 mg orally every 3 days for 14 days. He will also be started on fluoroscopy store 250 mg, 2 capsules twice daily for 30 days with 2 refills. As he has pain at his MTP joints and has a history of gout a uric acid, the metastases, and CBC will be obtained. Patient is to follow up in 3 weeks but was instructed to return sooner if his symptoms get worse. If he has another  recurrence he may be considered for a stool transplant.       Keith Sanders, Vita Barley PA-C 11/04/2014,

## 2014-11-04 NOTE — Patient Instructions (Signed)
We sent prescriptions to [facility]. 1. FLorastor 250 mg. 2. Vancomycin 125 mg- Take as directed. Take 1 cap 4 times daily for 4 days, then 1 cap twice daily for 7 days then 1 cap daily for 7 days then 1 cap every other day for 7 days then 1 cap every 3rd day for 14 days.  See Cecille Rubin on 11-25-2014 at 1:15 PM.

## 2014-11-05 NOTE — Progress Notes (Signed)
Reviewed and agree with management. Robert D. Kaplan, M.D., FACG  

## 2014-11-10 ENCOUNTER — Telehealth: Payer: Self-pay | Admitting: Family Medicine

## 2014-11-10 MED ORDER — ALPRAZOLAM 0.5 MG PO TABS: ORAL_TABLET | ORAL | Status: DC

## 2014-11-10 NOTE — Telephone Encounter (Signed)
Pt requesting rf of alprazolam.  Last RX was printed 05/11/14 x 5 rfs.  Please advise.

## 2014-11-10 NOTE — Telephone Encounter (Signed)
Rx faxed. Pt aware.

## 2014-11-10 NOTE — Telephone Encounter (Signed)
Alprazolam rx printed. 

## 2014-11-25 ENCOUNTER — Ambulatory Visit (INDEPENDENT_AMBULATORY_CARE_PROVIDER_SITE_OTHER): Payer: PRIVATE HEALTH INSURANCE | Admitting: Physician Assistant

## 2014-11-25 ENCOUNTER — Encounter: Payer: Self-pay | Admitting: Physician Assistant

## 2014-11-25 VITALS — BP 136/80 | HR 80 | Ht 69.0 in | Wt 175.0 lb

## 2014-11-25 DIAGNOSIS — A047 Enterocolitis due to Clostridium difficile: Secondary | ICD-10-CM

## 2014-11-25 DIAGNOSIS — A0472 Enterocolitis due to Clostridium difficile, not specified as recurrent: Secondary | ICD-10-CM

## 2014-11-25 MED ORDER — SACCHAROMYCES BOULARDII 250 MG PO CAPS: ORAL_CAPSULE | ORAL | Status: DC

## 2014-11-25 NOTE — Patient Instructions (Signed)
Finish you Vancomycin. Continue Florastor for at least 4 weeks after completely stopping the vancomycin. Follow up as needed.

## 2014-11-25 NOTE — Progress Notes (Signed)
Patient ID: Keith Sanders, male   DOB: 08-24-1950, 64 y.o.   MRN: [id number]     History of Present Illness: TREVAR BOEHRINGER  Who is followed by Dr. Deatra Ina. He was last seen here on April 20 with complaints of diarrhea since February. He had tested positive for C. Difficile and had been treated with an abbreviated course of Flagyl and vancomycin at outpatient facilities. When he was seen here on April 20 he was started on a vancomycin taper. He is currently on vancomycin 125 mg 1 capsule every third day until the end of next week. He returns today stating that he feels much better he's having one formed bowel movement on a daily basis. He has no abdominal pain , nausea, or vomiting. His appetite is good. He is relieved that his diarrhea has stopped.   Past Medical History  Diagnosis Date  . Chronic insomnia   . Chronic lumbar pain     DDD; ibuprofen, occasional muscle relaxers.  MRI L-spine 2007 showed some bulging discs with nerve impingement.  Has never had back injection or surgery.  . Contact dermatitis and eczema   . PUD (peptic ulcer disease)   . Diverticulitis   . Anxiety   . Gout     "always right big toe"--about one episode every 4 yrs  . History of adenomatous polyp of colon 2005  . HTN (hypertension)   . History of bronchitis 08/11/2012  . Hyperlipidemia, mixed   . BPPV (benign paroxysmal positional vertigo) 06/2014    Past Surgical History  Procedure Laterality Date  . Colonoscopy w/ polypectomy  2005    Dr. Lyla Son  . Colonoscopy  07/2013    Normal.  Recall 10 yrs.   Family History  Problem Relation Age of Onset  . Heart attack Father 90  . Heart disease Father     MI age 65  . Heart disease Other   . Colon cancer Neg Hx   . Colon polyps Mother   . Ovarian cancer Sister    History  Substance Use Topics  . Smoking status: Never Smoker   . Smokeless tobacco: Never Used  . Alcohol Use: 8.4 oz/week    14 Cans of beer per week   Current Outpatient Prescriptions    Medication Sig Dispense Refill  . ALPRAZolam (XANAX) 0.5 MG tablet Take1 tab po qid prn stress 120 tablet 5  . Ascorbic Acid (VITAMIN C) 1000 MG tablet Take 1,000 mg by mouth daily.      Marland Kitchen aspirin 81 MG tablet Take 81 mg by mouth daily.      Marland Kitchen atorvastatin (LIPITOR) 40 MG tablet Take 1 tablet (40 mg total) by mouth daily. 30 tablet 3  . fluticasone (FLONASE) 50 MCG/ACT nasal spray instill 2 sprays into each nostril once daily if needed for allergies 16 g 5  . metaxalone (SKELAXIN) 800 MG tablet 1/2-1 tab po tid prn 90 tablet 3  . Multiple Vitamin (MULTIVITAMIN) tablet Take 1 tablet by mouth daily.      . Omega-3 Fatty Acids (FISH OIL) 875 MG CAPS Take 2 capsules by mouth daily.      . pantoprazole (PROTONIX) 40 MG tablet take 1 tablet by mouth every morning 30 tablet 3  . saccharomyces boulardii (FLORASTOR) 250 MG capsule Take 2 capsules twice daily. 120 capsule 2  . triamcinolone cream (KENALOG) 0.1 % apply to affected area twice a day 45 g 5  . vancomycin (VANCOCIN) 125 MG capsule Take as directed.  46 capsule 0  . zolpidem (AMBIEN CR) 12.5 MG CR tablet take 1 tablet at bedtime if needed 30 tablet 5   No current facility-administered medications for this visit.   Allergies  Allergen Reactions  . Augmentin [Amoxicillin-Pot Clavulanate] Diarrhea    Upset stomach      Review of Systems: Gen: Denies any fever, chills, sweats, anorexia, fatigue, weakness, malaise, weight loss, and sleep disorder CV: Denies chest pain, angina, palpitations, syncope, orthopnea, PND, peripheral edema, and claudication. Resp: Denies dyspnea at rest, dyspnea with exercise, cough, sputum, wheezing, coughing up blood, and pleurisy. GI: Denies vomiting blood, jaundice, and fecal incontinence.   Denies dysphagia or odynophagia. GU : Denies urinary burning, blood in urine, urinary frequency, urinary hesitancy, nocturnal urination, and urinary incontinence. MS: Denies joint pain, limitation of movement, and  swelling, stiffness, low back pain, extremity pain. Denies muscle weakness, cramps, atrophy.  Derm: Denies rash, itching, dry skin, hives, moles, warts, or unhealing ulcers.  Psych: Denies depression, anxiety, memory loss, suicidal ideation, hallucinations, paranoia, and confusion. Heme: Denies bruising, bleeding, and enlarged lymph nodes. Neuro:  Denies any headaches, dizziness, paresthesia Endo:  Denies any problems with DM, thyroid, adrenal  LAB RESULTS:  C. Difficile on 09/23/2014 was positive  Physical Exam: General: Pleasant, well developed male in no acute distress Head: Normocephalic and atraumatic Eyes:  sclerae anicteric, conjunctiva pink  Ears: Normal auditory acuity Lungs: Clear throughout to auscultation Heart: Regular rate and rhythm Abdomen: Soft, non distended, non-tender. No masses, no hepatomegaly. Normal bowel sounds Musculoskeletal: Symmetrical with no gross deformities  Extremities: No edema  Neurological: Alert oriented x 4, grossly nonfocal Psychological:  Alert and cooperative. Normal mood and affect  Assessment and Recommendations:  64 year old male with C. Difficile diarrhea currently on vancomycin 125 mg 1 every third day till the end of next week , here for follow-up. He is doing well with a formed bowel movement on a daily basis and no abdominal pain, nausea, or vomiting. He's been instructed to complete his vancomycin taper as directed and to continue [employer] 250 mg 2 capsules twice daily for at least 4 weeks after completing his vancomycin. It was explained to the patient that C. Difficile can reoccur in 20% of the cases and that if he experiences a return of his diarrhea he should contact us immediately so that a repeat stool for C. Difficile can be obtained.        Jahmire Ruffins, Deloris Ping 11/25/2014,

## 2014-11-26 NOTE — Progress Notes (Signed)
Reviewed and agree with management. Phyllistine Domingos D. Raylon Lamson, M.D., FACG  

## 2014-12-07 ENCOUNTER — Other Ambulatory Visit: Payer: Self-pay | Admitting: *Deleted

## 2014-12-07 MED ORDER — ZOLPIDEM TARTRATE ER 12.5 MG PO TBCR: EXTENDED_RELEASE_TABLET | ORAL | Status: DC

## 2014-12-07 NOTE — Telephone Encounter (Signed)
Fax from [facility] ([place]) requesting refill for [employer] ER 12.5mg  tab take 1 tab po qhs prn. LOV 10/05/14, up coming ov 03/15/15, last written: #30 06/19/14 w/ 5RF. Please advised. Thanks.

## 2014-12-18 ENCOUNTER — Other Ambulatory Visit: Payer: Self-pay | Admitting: *Deleted

## 2014-12-18 MED ORDER — PANTOPRAZOLE SODIUM 40 MG PO TBEC: DELAYED_RELEASE_TABLET | ORAL | Status: DC

## 2014-12-18 NOTE — Telephone Encounter (Signed)
Fax from [facility] ([place]) requesting refill for pantoprazole 40mg  take 1 tablet daily. LOV 10/05/14, up coming ov 03/15/15. Last written: 07/16/14 #30 w/ 3RF.

## 2015-03-03 ENCOUNTER — Other Ambulatory Visit: Payer: Self-pay | Admitting: *Deleted

## 2015-03-03 ENCOUNTER — Telehealth: Payer: Self-pay | Admitting: *Deleted

## 2015-03-03 MED ORDER — SACCHAROMYCES BOULARDII 250 MG PO CAPS: ORAL_CAPSULE | ORAL | Status: DC

## 2015-03-03 NOTE — Telephone Encounter (Signed)
I asked the patient how he was doing since his visit with Cecille Rubin Hvoxdovic PA on 11-25-2014.  He said he was doing well and only has some very loose urgent stools now and then. He said the Florasor really helps him and he asked if he could take 1 tab twice daily instead of 2 tab twice daily.  Cecille Rubin advised me to send refills for the # 60 with 3 refills.

## 2015-03-06 ENCOUNTER — Emergency Department (HOSPITAL_BASED_OUTPATIENT_CLINIC_OR_DEPARTMENT_OTHER): Payer: PRIVATE HEALTH INSURANCE

## 2015-03-06 ENCOUNTER — Encounter (HOSPITAL_BASED_OUTPATIENT_CLINIC_OR_DEPARTMENT_OTHER): Payer: Self-pay | Admitting: *Deleted

## 2015-03-06 ENCOUNTER — Emergency Department (HOSPITAL_BASED_OUTPATIENT_CLINIC_OR_DEPARTMENT_OTHER)
Admission: EM | Admit: 2015-03-06 | Discharge: 2015-03-06 | Disposition: A | Discharge status: Home / Self Care | Payer: PRIVATE HEALTH INSURANCE | Attending: Emergency Medicine | Admitting: Emergency Medicine

## 2015-03-06 DIAGNOSIS — G8929 Other chronic pain: Secondary | ICD-10-CM | POA: Insufficient documentation

## 2015-03-06 DIAGNOSIS — M25572 Pain in left ankle and joints of left foot: Secondary | ICD-10-CM | POA: Diagnosis present

## 2015-03-06 DIAGNOSIS — Z7951 Long term (current) use of inhaled steroids: Secondary | ICD-10-CM | POA: Diagnosis not present

## 2015-03-06 DIAGNOSIS — Z8719 Personal history of other diseases of the digestive system: Secondary | ICD-10-CM | POA: Diagnosis not present

## 2015-03-06 DIAGNOSIS — G4700 Insomnia, unspecified: Secondary | ICD-10-CM | POA: Insufficient documentation

## 2015-03-06 DIAGNOSIS — E782 Mixed hyperlipidemia: Secondary | ICD-10-CM | POA: Diagnosis not present

## 2015-03-06 DIAGNOSIS — Z8709 Personal history of other diseases of the respiratory system: Secondary | ICD-10-CM | POA: Diagnosis not present

## 2015-03-06 DIAGNOSIS — Z7982 Long term (current) use of aspirin: Secondary | ICD-10-CM | POA: Diagnosis not present

## 2015-03-06 DIAGNOSIS — Z872 Personal history of diseases of the skin and subcutaneous tissue: Secondary | ICD-10-CM | POA: Insufficient documentation

## 2015-03-06 DIAGNOSIS — I10 Essential (primary) hypertension: Secondary | ICD-10-CM | POA: Diagnosis not present

## 2015-03-06 DIAGNOSIS — Z86018 Personal history of other benign neoplasm: Secondary | ICD-10-CM | POA: Diagnosis not present

## 2015-03-06 DIAGNOSIS — Z79899 Other long term (current) drug therapy: Secondary | ICD-10-CM | POA: Diagnosis not present

## 2015-03-06 MED ORDER — NAPROXEN 500 MG PO TABS: 500.0000 mg | ORAL_TABLET | Freq: Two times a day (BID) | ORAL | Status: DC

## 2015-03-06 NOTE — ED Provider Notes (Addendum)
CSN: [id number]     Arrival date & time 03/06/15  0730 History   First MD Initiated Contact with Patient 03/06/15 [phone]     Chief Complaint  Patient presents with  . Ankle Pain     (Consider location/radiation/quality/duration/timing/severity/associated sxs/prior Treatment) Patient is a 64 y.o. male presenting with ankle pain. The history is provided by the patient.  Ankle Pain Associated symptoms: no back pain and no fever    patient with complaint of left ankle pain since yesterday. No known distinct injury but does spend a lot of time on his feet with his job. Patient says painful to walk on it. Has had a history of gout in the past but this seems to be different. Pain with walking is 8 out of 10. Better with rest. Swelling seems to be to the medial aspect of the left ankle. No redness. No fevers.  Past Medical History  Diagnosis Date  . Chronic insomnia   . Chronic lumbar pain     DDD; ibuprofen, occasional muscle relaxers.  MRI L-spine 2007 showed some bulging discs with nerve impingement.  Has never had back injection or surgery.  . Contact dermatitis and eczema   . PUD (peptic ulcer disease)   . Diverticulitis   . Anxiety   . Gout     "always right big toe"--about one episode every 4 yrs  . History of adenomatous polyp of colon 2005  . HTN (hypertension)   . History of bronchitis 08/11/2012  . Hyperlipidemia, mixed   . BPPV (benign paroxysmal positional vertigo) 06/2014   Past Surgical History  Procedure Laterality Date  . Colonoscopy w/ polypectomy  2005    Dr. Lyla Son  . Colonoscopy  07/2013    Normal.  Recall 10 yrs.   Family History  Problem Relation Age of Onset  . Heart attack Father 51  . Heart disease Father     MI age 37  . Heart disease Other   . Colon cancer Neg Hx   . Colon polyps Mother   . Ovarian cancer Sister    Social History  Substance Use Topics  . Smoking status: Never Smoker   . Smokeless tobacco: Never Used  . Alcohol Use: 8.4 oz/week     14 Cans of beer per week    Review of Systems  Constitutional: Negative for fever.  HENT: Negative for congestion.   Eyes: Negative for redness.  Respiratory: Negative for shortness of breath.   Cardiovascular: Negative for chest pain.  Gastrointestinal: Negative for abdominal pain.  Musculoskeletal: Positive for arthralgias. Negative for back pain.  Skin: Negative for rash.  Neurological: Negative for headaches.  Hematological: Does not bruise/bleed easily.  Psychiatric/Behavioral: Negative for confusion.      Allergies  Augmentin  Home Medications   Prior to Admission medications   Medication Sig Start Date End Date Taking? Authorizing Provider  ALPRAZolam Duanne Moron) 0.5 MG tablet Take1 tab po qid prn stress 11/10/14  Yes Tammi Sou, MD  Ascorbic Acid (VITAMIN C) 1000 MG tablet Take 1,000 mg by mouth daily.     Yes Historical Provider, MD  aspirin 81 MG tablet Take 81 mg by mouth daily.     Yes Historical Provider, MD  metaxalone (SKELAXIN) 800 MG tablet 1/2-1 tab po tid prn 06/19/14  Yes Tammi Sou, MD  pantoprazole (PROTONIX) 40 MG tablet take 1 tablet by mouth every morning 12/18/14  Yes Tammi Sou, MD  triamcinolone cream (KENALOG) 0.1 % apply to affected  area twice a day 11/12/13  Yes Tammi Sou, MD  zolpidem (AMBIEN CR) 12.5 MG CR tablet take 1 tablet at bedtime if needed 12/07/14  Yes Tammi Sou, MD  atorvastatin (LIPITOR) 40 MG tablet Take 1 tablet (40 mg total) by mouth daily. 07/06/14   Tammi Sou, MD  fluticasone Asencion Islam) 50 MCG/ACT nasal spray instill 2 sprays into each nostril once daily if needed for allergies 03/26/13   Tammi Sou, MD  Multiple Vitamin (MULTIVITAMIN) tablet Take 1 tablet by mouth daily.      Historical Provider, MD  naproxen (NAPROSYN) 500 MG tablet Take 1 tablet (500 mg total) by mouth 2 (two) times daily. 03/06/15   Fredia Sorrow, MD  Omega-3 Fatty Acids (FISH OIL) 875 MG CAPS Take 2 capsules by mouth  daily.      Historical Provider, MD  saccharomyces boulardii (FLORASTOR) 250 MG capsule Take 1 capsules twice daily. 03/03/15   Lori P Hvozdovic, PA-C  vancomycin (VANCOCIN) 125 MG capsule Take as directed. 11/04/14   Lori P Hvozdovic, PA-C   BP 130/81 mmHg  Pulse 86  Temp(Src) 98.3 F (36.8 C) (Oral)  Resp 18  Ht 5\' 11"  (1.803 m)  Wt 178 lb (80.74 kg)  BMI 24.84 kg/m2  SpO2 100% Physical Exam  Constitutional: He is oriented to person, place, and time. He appears well-developed and well-nourished. No distress.  HENT:  Head: Normocephalic and atraumatic.  Eyes: Conjunctivae and EOM are normal. Pupils are equal, round, and reactive to light.  Cardiovascular: Normal rate, regular rhythm and normal heart sounds.   Pulmonary/Chest: Effort normal and breath sounds normal. No respiratory distress.  Abdominal: Soft. Bowel sounds are normal. There is no tenderness.  Musculoskeletal: He exhibits edema.  Mild tenderness to palpation to medial aspect the left ankle with some swelling there. No redness slight increase in warmth. Dorsalis pedis pulses 2+ sensation intact. No proximal fibula tenderness.  Neurological: He is alert and oriented to person, place, and time. No cranial nerve deficit. He exhibits normal muscle tone. Coordination normal.  Skin: Skin is warm. No erythema.  Nursing note and vitals reviewed.   ED Course  Procedures (including critical care time) Labs Review Labs Reviewed - No data to display  Imaging Review Dg Ankle Complete Left  03/06/2015   CLINICAL DATA:  Left ankle pain worse with weight-bearing. No known injury.  EXAM: LEFT ANKLE COMPLETE - 3+ VIEW  COMPARISON:  None.  FINDINGS: There is no evidence of fracture, dislocation, or joint effusion. There is no evidence of arthropathy or other focal bone abnormality. Soft tissues are unremarkable.  IMPRESSION: Negative.   Electronically Signed   By: Earle Gell M.D.   On: 03/06/2015 08:50   I have personally reviewed and  evaluated these images and lab results as part of my medical decision-making.   EKG Interpretation None      MDM   Final diagnoses:  Ankle pain, left    X-ray of the left ankle without any bony injuries. Will treat as if it's an ankle sprain but it is possible that it's early gout. Referral to sports medicine will be treated with a Cam Walker and anti-inflammatory medicines.    Fredia Sorrow, MD 03/06/15 2025  Fredia Sorrow, MD 03/06/15 [phone]

## 2015-03-06 NOTE — Discharge Instructions (Signed)
[facility] Cam Walker as needed for comfort. X-rays were negative for any bony injuries. Of redness starts to develop in the areas most likely a gout type problem. If not probably just ankle sprain. If things do not improve over a week can follow-up with sports medicine or your record Dr. Joellyn Rued the Naprosyn as directed

## 2015-03-06 NOTE — ED Notes (Signed)
MD at bedside. 

## 2015-03-06 NOTE — ED Notes (Signed)
Presents with left ankle pain, states onset was yesterday am, denies any injury to site, pain progressively worse since onset, difficulty to place weight on left ankle

## 2015-03-15 ENCOUNTER — Encounter: Payer: Self-pay | Admitting: Family Medicine

## 2015-03-15 ENCOUNTER — Ambulatory Visit (INDEPENDENT_AMBULATORY_CARE_PROVIDER_SITE_OTHER): Payer: PRIVATE HEALTH INSURANCE | Admitting: Family Medicine

## 2015-03-15 VITALS — BP 136/88 | HR 76 | Temp 97.9°F | Resp 16 | Ht 69.75 in | Wt 169.0 lb

## 2015-03-15 DIAGNOSIS — E782 Mixed hyperlipidemia: Secondary | ICD-10-CM

## 2015-03-15 DIAGNOSIS — Z0000 Encounter for general adult medical examination without abnormal findings: Secondary | ICD-10-CM | POA: Diagnosis not present

## 2015-03-15 DIAGNOSIS — Z125 Encounter for screening for malignant neoplasm of prostate: Secondary | ICD-10-CM

## 2015-03-15 DIAGNOSIS — R7989 Other specified abnormal findings of blood chemistry: Secondary | ICD-10-CM

## 2015-03-15 LAB — CBC WITH DIFFERENTIAL/PLATELET
Basophils Absolute: 0 10*3/uL (ref 0.0–0.1)
Basophils Relative: 0.4 % (ref 0.0–3.0)
Eosinophils Absolute: 1.4 10*3/uL — ABNORMAL HIGH (ref 0.0–0.7)
Eosinophils Relative: 17.6 % — ABNORMAL HIGH (ref 0.0–5.0)
HCT: 42.7 % (ref 39.0–52.0)
Hemoglobin: 14.8 g/dL (ref 13.0–17.0)
Lymphocytes Relative: 24.2 % (ref 12.0–46.0)
Lymphs Abs: 1.9 10*3/uL (ref 0.7–4.0)
MCHC: 34.7 g/dL (ref 30.0–36.0)
MCV: 96 fl (ref 78.0–100.0)
Monocytes Absolute: 0.6 10*3/uL (ref 0.1–1.0)
Monocytes Relative: 7 % (ref 3.0–12.0)
Neutro Abs: 4 10*3/uL (ref 1.4–7.7)
Neutrophils Relative %: 50.8 % (ref 43.0–77.0)
Platelets: 237 10*3/uL (ref 150.0–400.0)
RBC: 4.44 Mil/uL (ref 4.22–5.81)
RDW: 13.8 % (ref 11.5–15.5)
WBC: 7.9 10*3/uL (ref 4.0–10.5)

## 2015-03-15 LAB — LIPID PANEL
Cholesterol: 156 mg/dL (ref 0–200)
HDL: 43.9 mg/dL (ref >39.00)
NonHDL: 111.76
Total CHOL/HDL Ratio: 4
Triglycerides: 268 mg/dL — ABNORMAL HIGH (ref 0.0–149.0)
VLDL: 53.6 mg/dL — ABNORMAL HIGH (ref 0.0–40.0)

## 2015-03-15 LAB — COMPREHENSIVE METABOLIC PANEL
ALT: 19 U/L (ref 0–53)
AST: 19 U/L (ref 0–37)
Albumin: 4.2 g/dL (ref 3.5–5.2)
Alkaline Phosphatase: 74 U/L (ref 39–117)
BUN: 15 mg/dL (ref 6–23)
CO2: 24 mEq/L (ref 19–32)
Calcium: 9 mg/dL (ref 8.4–10.5)
Chloride: 107 mEq/L (ref 96–112)
Creatinine, Ser: 0.91 mg/dL (ref 0.40–1.50)
GFR: 89.72 mL/min (ref >60.00)
Glucose, Bld: 94 mg/dL (ref 70–99)
Potassium: 3.8 mEq/L (ref 3.5–5.1)
Sodium: 140 mEq/L (ref 135–145)
Total Bilirubin: 1.7 mg/dL — ABNORMAL HIGH (ref 0.2–1.2)
Total Protein: 6.2 g/dL (ref 6.0–8.3)

## 2015-03-15 LAB — PSA: PSA: 0.38 ng/mL (ref 0.10–4.00)

## 2015-03-15 LAB — TSH: TSH: 3.13 u[IU]/mL (ref 0.35–4.50)

## 2015-03-15 LAB — LDL CHOLESTEROL, DIRECT: Direct LDL: 78 mg/dL

## 2015-03-15 NOTE — Progress Notes (Signed)
Pre visit review using our clinic review tool, if applicable. No additional management support is needed unless otherwise documented below in the visit note. 

## 2015-03-15 NOTE — Progress Notes (Addendum)
Office Note 03/15/2015  CC:  Chief Complaint  Patient presents with  . Annual Exam    Pt is fasting.    HPI:  Keith Sanders is a 64 y.o. White male who is here for annual health maintenance exam. GI issues/diarrhea--c diff--all straightened out now.  Taking probiotic.  No new complaints.   Past Medical History  Diagnosis Date  . Chronic insomnia   . Chronic lumbar pain     DDD; ibuprofen, occasional muscle relaxers.  MRI L-spine 2007 showed some bulging discs with nerve impingement.  Has never had back injection or surgery.  . Contact dermatitis and eczema   . PUD (peptic ulcer disease)   . Diverticulitis   . Anxiety   . Gout     "always right big toe"--about one episode every 4 yrs  . History of adenomatous polyp of colon 2005  . HTN (hypertension)   . History of bronchitis 08/11/2012  . Hyperlipidemia, mixed   . BPPV (benign paroxysmal positional vertigo) 06/2014  . History of Clostridium difficile colitis 2016    Past Surgical History  Procedure Laterality Date  . Colonoscopy w/ polypectomy  2005    Dr. Lyla Son  . Colonoscopy  07/2013    Normal.  Recall 10 yrs.    Family History  Problem Relation Age of Onset  . Heart attack Father 83  . Heart disease Father     MI age 13  . Heart disease Other   . Colon cancer Neg Hx   . Colon polyps Mother   . Ovarian cancer Sister     Social History   Social History  . Marital Status: Married    Spouse Name: N/A  . Number of Children: 3  . Years of Education: N/A   Occupational History  . sales    Social History Main Topics  . Smoking status: Never Smoker   . Smokeless tobacco: Never Used  . Alcohol Use: 8.4 oz/week    14 Cans of beer per week  . Drug Use: No  . Sexual Activity: Yes   Other Topics Concern  . Not on file   Social History Narrative   Married, 3 grown children.   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   Lived in [place] area all his life.   No T/A/Ds.   Walks 3-4 times per day.    Not taking vancomycin listed below Outpatient Prescriptions Prior to Visit  Medication Sig Dispense Refill  . ALPRAZolam (XANAX) 0.5 MG tablet Take1 tab po qid prn stress 120 tablet 5  . Ascorbic Acid (VITAMIN C) 1000 MG tablet Take 1,000 mg by mouth daily.      Marland Kitchen aspirin 81 MG tablet Take 81 mg by mouth daily.      Marland Kitchen atorvastatin (LIPITOR) 40 MG tablet Take 1 tablet (40 mg total) by mouth daily. 30 tablet 3  . fluticasone (FLONASE) 50 MCG/ACT nasal spray instill 2 sprays into each nostril once daily if needed for allergies 16 g 5  . metaxalone (SKELAXIN) 800 MG tablet 1/2-1 tab po tid prn 90 tablet 3  . Multiple Vitamin (MULTIVITAMIN) tablet Take 1 tablet by mouth daily.      . Omega-3 Fatty Acids (FISH OIL) 875 MG CAPS Take 2 capsules by mouth daily.      . pantoprazole (PROTONIX) 40 MG tablet take 1 tablet by mouth every morning 30 tablet 3  . saccharomyces boulardii (FLORASTOR) 250 MG capsule Take 1 capsules twice daily. 60 capsule 3  .  triamcinolone cream (KENALOG) 0.1 % apply to affected area twice a day 45 g 5  . zolpidem (AMBIEN CR) 12.5 MG CR tablet take 1 tablet at bedtime if needed 30 tablet 5  . naproxen (NAPROSYN) 500 MG tablet Take 1 tablet (500 mg total) by mouth 2 (two) times daily. (Patient not taking: Reported on 03/15/2015) 14 tablet 0  . vancomycin (VANCOCIN) 125 MG capsule Take as directed. (Patient not taking: Reported on 03/15/2015) 46 capsule 0   No facility-administered medications prior to visit.    Allergies  Allergen Reactions  . Augmentin [Amoxicillin-Pot Clavulanate] Diarrhea    Upset stomach    ROS Review of Systems  Constitutional: Negative for fever, chills, appetite change and fatigue.  HENT: Negative for congestion, dental problem, ear pain and sore throat.   Eyes: Negative for discharge, redness and visual disturbance.  Respiratory: Negative for cough, chest tightness, shortness of breath and wheezing.   Cardiovascular: Negative for chest pain,  palpitations and leg swelling.  Gastrointestinal: Negative for nausea, vomiting, abdominal pain, diarrhea and blood in stool.  Genitourinary: Negative for dysuria, urgency, frequency, hematuria, flank pain and difficulty urinating.  Musculoskeletal: Negative for myalgias, back pain, joint swelling, arthralgias and neck stiffness.  Skin: Negative for pallor and rash.  Neurological: Negative for dizziness, speech difficulty, weakness and headaches.  Hematological: Negative for adenopathy. Does not bruise/bleed easily.  Psychiatric/Behavioral: Negative for confusion and sleep disturbance. The patient is not nervous/anxious.     PE; Blood pressure 136/88, pulse 76, temperature 97.9 F (36.6 C), temperature source Oral, resp. rate 16, height 5' 9.75" (1.772 m), weight 169 lb (76.658 kg), SpO2 96 %. Gen: Alert, well appearing.  Patient is oriented to person, place, time, and situation. AFFECT: pleasant, lucid thought and speech. ENT: Ears: EACs clear, normal epithelium.  TMs with good light reflex and landmarks bilaterally.  Eyes: no injection, icteris, swelling, or exudate.  EOMI, PERRLA. Nose: no drainage or turbinate edema/swelling.  No injection or focal lesion.  Mouth: lips without lesion/swelling.  Oral mucosa pink and moist.  Dentition intact and without obvious caries or gingival swelling.  Oropharynx without erythema, exudate, or swelling.  Neck: supple/nontender.  No LAD, mass, or TM.  Carotid pulses 2+ bilaterally, without bruits. CV: RRR, no m/r/g.   LUNGS: CTA bilat, nonlabored resps, good aeration in all lung fields. ABD: soft, NT, ND, BS normal.  No hepatospenomegaly or mass.  No bruits. EXT: no clubbing, cyanosis, or edema.  Musculoskeletal: no joint swelling, erythema, warmth, or tenderness.  ROM of all joints intact. Skin - no sores or suspicious lesions or rashes or color changes Rectal exam: negative without mass, lesions or tenderness, PROSTATE EXAM: smooth and symmetric  without nodules or tenderness.   Pertinent labs:  Lab Results  Component Value Date   TSH 2.35 03/09/2014   Lab Results  Component Value Date   WBC 12.1* 11/04/2014   HGB 14.7 11/04/2014   HCT 42.3 11/04/2014   MCV 92.6 11/04/2014   PLT 338.0 11/04/2014   Lab Results  Component Value Date   CREATININE 0.84 11/04/2014   BUN 20 11/04/2014   NA 137 11/04/2014   K 3.9 11/04/2014   CL 102 11/04/2014   CO2 29 11/04/2014   Lab Results  Component Value Date   ALT 24 09/22/2014   AST 16 09/22/2014   ALKPHOS 77 09/22/2014   BILITOT 1.3* 09/22/2014   Lab Results  Component Value Date   CHOL 181 05/18/2014   Lab Results  Component  Value Date   HDL 46.70 05/18/2014   No results found for: [facility] Lab Results  Component Value Date   TRIG 265.0* 05/18/2014   Lab Results  Component Value Date   CHOLHDL 4 05/18/2014   Lab Results  Component Value Date   PSA 0.53 03/09/2014   PSA 0.39 03/06/2013   PSA 0.35 03/05/2012   ASSESSMENT AND PLAN:   Health maintenance exam:  Reviewed age and gender appropriate health maintenance issues (prudent diet, regular exercise, health risks of tobacco and excessive alcohol, use of seatbelts, fire alarms in home, use of sunscreen).  Also reviewed age and gender appropriate health screening as well as vaccine recommendations. HP labs + PSA today. DRE normal today. He declined Hep C and HIV screening today. Declined zostavax. He is UTD on colon cancer screening: next colonoscopy 2025. No vaccines due today.  FOLLOW UP:  No Follow-up on file.

## 2015-04-26 ENCOUNTER — Other Ambulatory Visit: Payer: Self-pay | Admitting: *Deleted

## 2015-04-26 MED ORDER — ATORVASTATIN CALCIUM 40 MG PO TABS: 40.0000 mg | ORAL_TABLET | Freq: Every day | ORAL | Status: DC

## 2015-04-26 MED ORDER — PANTOPRAZOLE SODIUM 40 MG PO TBEC: DELAYED_RELEASE_TABLET | ORAL | Status: DC

## 2015-04-26 NOTE — Telephone Encounter (Signed)
LOV: 03/15/15 NOV: 03/14/16  RF request for atorvastatin Last written: 07/06/14 #30 w/ 3  RF request for pantoprazole Last written: 12/18/14 #30 w/ 3

## 2015-05-20 ENCOUNTER — Other Ambulatory Visit: Payer: Self-pay | Admitting: *Deleted

## 2015-05-20 MED ORDER — ALPRAZOLAM 0.5 MG PO TABS: ORAL_TABLET | ORAL | Status: DC

## 2015-05-20 NOTE — Telephone Encounter (Signed)
Pt LMOM on 05/20/15 at 1:06pm requesting refill.  RF request for alprazolam LOV: 03/15/15 Next ov: 03/14/16 Last written: 11/10/14 #120 w/ 5RF  Please advise. Thanks.

## 2015-05-20 NOTE — Telephone Encounter (Signed)
Rx faxed

## 2015-05-26 ENCOUNTER — Other Ambulatory Visit: Payer: Self-pay | Admitting: *Deleted

## 2015-05-26 MED ORDER — ZOLPIDEM TARTRATE ER 12.5 MG PO TBCR: EXTENDED_RELEASE_TABLET | ORAL | Status: DC

## 2015-05-26 NOTE — Telephone Encounter (Signed)
Rx faxed

## 2015-05-26 NOTE — Telephone Encounter (Signed)
RF request for zolpidem LOV: 03/15/15 Next ov: 03/14/16 Last written: 12/07/14 #30 w/ 5RF Please advise. Thanks.

## 2015-06-21 ENCOUNTER — Telehealth: Payer: Self-pay | Admitting: *Deleted

## 2015-06-21 MED ORDER — IBUPROFEN 800 MG PO TABS: ORAL_TABLET | ORAL | Status: DC

## 2015-06-21 NOTE — Telephone Encounter (Signed)
Refill request for ibuprofen 800mg  LOV: 03/15/15 NOV: 03/14/16 Last written: 09/16/14 #90 w/ 3RF  This medication is not an active medication. Please advise. Thanks.

## 2015-06-21 NOTE — Telephone Encounter (Signed)
OK to eRx ibuprofen 800 mg, 1 tab tid with food prn pain, #90, RF x 5.-thx

## 2015-06-21 NOTE — Telephone Encounter (Signed)
Rx sent as directed. 

## 2015-07-02 ENCOUNTER — Other Ambulatory Visit: Payer: Self-pay | Admitting: Family Medicine

## 2015-07-02 NOTE — Telephone Encounter (Signed)
RF request for Triamcinolone LOV: 03/15/15 Next ov: None Last written: 11/12/13 45g w/ 5RF

## 2015-08-18 DIAGNOSIS — A6000 Herpesviral infection of urogenital system, unspecified: Secondary | ICD-10-CM

## 2015-08-18 HISTORY — DX: Herpesviral infection of urogenital system, unspecified: A60.00

## 2015-08-30 ENCOUNTER — Encounter: Payer: Self-pay | Admitting: Family Medicine

## 2015-08-30 ENCOUNTER — Ambulatory Visit (INDEPENDENT_AMBULATORY_CARE_PROVIDER_SITE_OTHER): Payer: PRIVATE HEALTH INSURANCE | Admitting: Family Medicine

## 2015-08-30 VITALS — BP 143/91 | HR 77 | Temp 98.1°F | Resp 16 | Ht 69.75 in | Wt 174.0 lb

## 2015-08-30 DIAGNOSIS — A6000 Herpesviral infection of urogenital system, unspecified: Secondary | ICD-10-CM

## 2015-08-30 DIAGNOSIS — N485 Ulcer of penis: Secondary | ICD-10-CM | POA: Diagnosis not present

## 2015-08-30 MED ORDER — MUPIROCIN 2 % EX OINT: 1.0000 "application " | TOPICAL_OINTMENT | Freq: Three times a day (TID) | CUTANEOUS | Status: DC

## 2015-08-30 MED ORDER — VALACYCLOVIR HCL 1 G PO TABS: 1000.0000 mg | ORAL_TABLET | Freq: Two times a day (BID) | ORAL | Status: DC

## 2015-08-30 NOTE — Progress Notes (Signed)
OFFICE NOTE  08/30/2015  CC:  Chief Complaint  Patient presents with  . Rash    x 2.5 weeks   HPI: Patient is a 65 y.o. Caucasian male who is here for lesions on penis. Has had this a couple weeks, some swelling noted on R side of head of penis or foreskin--pt not sure.  Then that part went away and just some small ulcers remain.  These itch some and burn some but nothing too bad per pt.  Applying steroid cream and vaseline and this is possibly making it worse.  Had a couple of days of mild burning with urination at the beginning, but not since and he has not noted any penile discharge or feeling of swollen lymph glands in the groin.  No fever.  He recalls having a painful, bleeding hemorrhoid in the days preceding discovery of the lesions on the penis.  The hemorrhoid resolved and he noted the ulcers on penis.  Denies malaise, groin pain, LB pain, or sciatica pain. Denies ever having similar lesions.  He is married 30+ years, monogamous.   He has never had problems with recurrent oral ulcers.  Pertinent PMH:  Past medical, surgical, social, and family history reviewed and no changes are noted since last office visit.  MEDS:  Outpatient Prescriptions Prior to Visit  Medication Sig Dispense Refill  . ALPRAZolam (XANAX) 0.5 MG tablet Take1 tab po qid prn stress 120 tablet 5  . Ascorbic Acid (VITAMIN C) 1000 MG tablet Take 1,000 mg by mouth daily.      Marland Kitchen aspirin 81 MG tablet Take 81 mg by mouth daily.      Marland Kitchen atorvastatin (LIPITOR) 40 MG tablet Take 1 tablet (40 mg total) by mouth daily. 30 tablet 11  . fluticasone (FLONASE) 50 MCG/ACT nasal spray instill 2 sprays into each nostril once daily if needed for allergies 16 g 5  . ibuprofen (ADVIL,MOTRIN) 800 MG tablet 1 tablet 3 times daily with food as needed for pain 90 tablet 5  . metaxalone (SKELAXIN) 800 MG tablet 1/2-1 tab po tid prn 90 tablet 3  . Multiple Vitamin (MULTIVITAMIN) tablet Take 1 tablet by mouth daily.      . Omega-3 Fatty  Acids (FISH OIL) 875 MG CAPS Take 2 capsules by mouth daily.      . pantoprazole (PROTONIX) 40 MG tablet take 1 tablet by mouth every morning 30 tablet 11  . saccharomyces boulardii (FLORASTOR) 250 MG capsule Take 1 capsules twice daily. 60 capsule 3  . triamcinolone cream (KENALOG) 0.1 % apply to affected area twice a day 45 g 5  . zolpidem (AMBIEN CR) 12.5 MG CR tablet take 1 tablet at bedtime if needed 30 tablet 5   No facility-administered medications prior to visit.    PE: Blood pressure 143/91, pulse 77, temperature 98.1 F (36.7 C), temperature source Oral, resp. rate 16, height 5' 9.75" (1.772 m), weight 174 lb (78.926 kg), SpO2 96 %. Gen: Alert, well appearing.  Patient is oriented to person, place, time, and situation. GU: on the dorsal aspect of shaft of penis near the corona of the glans are 3-4 small (34mm or less) superficial ulcerations that are round and with a dry base.  In the midst of these there is a white patch of skin.  IMPRESSION AND PLAN:  Penile ulcers; suspect genital herpes.  DDX: chancroid, syphilitic chancre, behcet's syndrome---all of which I doubt are occuring in this person).   Sent viral culture swab for HSV. Started  valtrex 1g bid x 10d. Since skin barrier broken x 2 wks, will have pt apply bactroban ointment tid to prevent bacterial superinfection. Stop application of steroid cream.  An After Visit Summary was printed and given to the patient.  FOLLOW UP: prn

## 2015-08-30 NOTE — Progress Notes (Signed)
Pre visit review using our clinic review tool, if applicable. No additional management support is needed unless otherwise documented below in the visit note. 

## 2015-09-01 ENCOUNTER — Encounter: Payer: Self-pay | Admitting: Family Medicine

## 2015-09-01 LAB — HERPES SIMPLEX VIRUS CULTURE: Organism ID, Bacteria: DETECTED

## 2015-09-09 ENCOUNTER — Encounter: Payer: Self-pay | Admitting: Family Medicine

## 2015-09-09 NOTE — Telephone Encounter (Signed)
Please advise. Thanks.  

## 2015-10-08 ENCOUNTER — Ambulatory Visit (INDEPENDENT_AMBULATORY_CARE_PROVIDER_SITE_OTHER): Payer: PRIVATE HEALTH INSURANCE | Admitting: Family Medicine

## 2015-10-08 ENCOUNTER — Encounter: Payer: Self-pay | Admitting: Family Medicine

## 2015-10-08 VITALS — BP 168/96 | HR 101 | Temp 100.0°F | Resp 16 | Ht 69.75 in | Wt 173.2 lb

## 2015-10-08 DIAGNOSIS — J111 Influenza due to unidentified influenza virus with other respiratory manifestations: Secondary | ICD-10-CM

## 2015-10-08 DIAGNOSIS — R69 Illness, unspecified: Principal | ICD-10-CM

## 2015-10-08 DIAGNOSIS — J209 Acute bronchitis, unspecified: Secondary | ICD-10-CM

## 2015-10-08 DIAGNOSIS — R030 Elevated blood-pressure reading, without diagnosis of hypertension: Secondary | ICD-10-CM | POA: Diagnosis not present

## 2015-10-08 MED ORDER — PREDNISONE 20 MG PO TABS: ORAL_TABLET | ORAL | Status: DC

## 2015-10-08 MED ORDER — BENZONATATE 200 MG PO CAPS: 200.0000 mg | ORAL_CAPSULE | Freq: Three times a day (TID) | ORAL | Status: DC | PRN

## 2015-10-08 NOTE — Progress Notes (Signed)
Pre visit review using our clinic review tool, if applicable. No additional management support is needed unless otherwise documented below in the visit note. 

## 2015-10-08 NOTE — Patient Instructions (Signed)
Avoid ibuprofen and over the counter decongestant medication until your blood pressure goes back to normal (<140/90).

## 2015-10-08 NOTE — Progress Notes (Signed)
OFFICE VISIT  10/08/2015   CC:  Chief Complaint  Patient presents with  . URI    x 4 days   HPI:    Patient is a 65 y.o. Caucasian male who presents for respiratory complaints. Onset 4 d/a fatigue, ST, nasal congestion, head pressure.  Coughing a lot as well, hurts in chest when he coughs. Subjective fevers last 2d.  No n/v/d. +Body aches present.  Took mucinex D yesterday.    Past home bp monitoring has shown it to be 130s/80s.  He has never required bp med.   Past Medical History  Diagnosis Date  . Chronic insomnia   . Chronic lumbar pain     DDD; ibuprofen, occasional muscle relaxers.  MRI L-spine 2007 showed some bulging discs with nerve impingement.  Has never had back injection or surgery.  . Contact dermatitis and eczema   . PUD (peptic ulcer disease)   . Diverticulitis   . Anxiety   . Gout     "always right big toe"--about one episode every 4 yrs  . History of adenomatous polyp of colon 2005  . HTN (hypertension)   . History of bronchitis 08/11/2012  . Hyperlipidemia, mixed   . BPPV (benign paroxysmal positional vertigo) 06/2014  . History of Clostridium difficile colitis 2016  . Genital herpes 08/2015    HSV type I on culture    Past Surgical History  Procedure Laterality Date  . Colonoscopy w/ polypectomy  2005    Dr. Lyla Son  . Colonoscopy  07/2013    Normal.  Recall 10 yrs.    Outpatient Prescriptions Prior to Visit  Medication Sig Dispense Refill  . ALPRAZolam (XANAX) 0.5 MG tablet Take1 tab po qid prn stress 120 tablet 5  . Ascorbic Acid (VITAMIN C) 1000 MG tablet Take 1,000 mg by mouth daily.      Marland Kitchen aspirin 81 MG tablet Take 81 mg by mouth daily.      Marland Kitchen atorvastatin (LIPITOR) 40 MG tablet Take 1 tablet (40 mg total) by mouth daily. 30 tablet 11  . fluticasone (FLONASE) 50 MCG/ACT nasal spray instill 2 sprays into each nostril once daily if needed for allergies 16 g 5  . ibuprofen (ADVIL,MOTRIN) 800 MG tablet 1 tablet 3 times daily with food as  needed for pain 90 tablet 5  . metaxalone (SKELAXIN) 800 MG tablet 1/2-1 tab po tid prn 90 tablet 3  . Multiple Vitamin (MULTIVITAMIN) tablet Take 1 tablet by mouth daily.      . Omega-3 Fatty Acids (FISH OIL) 875 MG CAPS Take 2 capsules by mouth daily.      . pantoprazole (PROTONIX) 40 MG tablet take 1 tablet by mouth every morning 30 tablet 11  . saccharomyces boulardii (FLORASTOR) 250 MG capsule Take 1 capsules twice daily. 60 capsule 3  . triamcinolone cream (KENALOG) 0.1 % apply to affected area twice a day 45 g 5  . zolpidem (AMBIEN CR) 12.5 MG CR tablet take 1 tablet at bedtime if needed 30 tablet 5  . mupirocin ointment (BACTROBAN) 2 % Apply 1 application topically 3 (three) times daily. (Patient not taking: Reported on 10/08/2015) 15 g 0  . valACYclovir (VALTREX) 1000 MG tablet Take 1 tablet (1,000 mg total) by mouth 2 (two) times daily. (Patient not taking: Reported on 10/08/2015) 20 tablet 0   No facility-administered medications prior to visit.    Allergies  Allergen Reactions  . Augmentin [Amoxicillin-Pot Clavulanate] Diarrhea    Upset stomach  ROS As per HPI  PE: Blood pressure 168/96, pulse 101, temperature 100 F (37.8 C), temperature source Oral, resp. rate 16, height 5' 9.75" (1.772 m), weight 173 lb 4 oz (78.586 kg), SpO2 100 %. VS: noted--normal. Gen: alert, NAD, NONTOXIC APPEARING. HEENT: eyes without injection, drainage, or swelling.  Ears: EACs clear, TMs with normal light reflex and landmarks.  Nose: Clear rhinorrhea, with some dried, crusty exudate adherent to mildly injected mucosa.  No purulent d/c.  No paranasal sinus TTP.  No facial swelling.  Throat and mouth without focal lesion.  No pharyngial swelling, erythema, or exudate.   Neck: supple, no LAD.   LUNGS: CTA bilat, nonlabored resps.  He coughs with every exhalation--dry. CV: RRR, no m/r/g. EXT: no c/c/e SKIN: no rash   LABS:  none  IMPRESSION AND PLAN:  1) Influenza-like illness. He is past  the 2d window for tx with tamiflu. He does have bronchitis and I think he would benefit from prednisone 40mg  qd x 5d. Tessalon perles 200mg  tid prn, #30, no RF.  2) Elevated bp w/out dx of HTN: he says he feels like bp has been up the last couple days. Historically, home monitoring has shown normal bp per his report today. I've asked him to keep monitoring at home and avoid NSAIDs and otc decongestant meds until bp is back into normal range. Call or return if bp doesn't normalize.  An After Visit Summary was printed and given to the patient.  FOLLOW UP: Return if symptoms worsen or fail to improve.  Signed:  Crissie Sickles, MD           10/08/2015

## 2015-10-11 ENCOUNTER — Other Ambulatory Visit: Payer: Self-pay | Admitting: Family Medicine

## 2015-10-11 ENCOUNTER — Telehealth: Payer: Self-pay | Admitting: *Deleted

## 2015-10-11 MED ORDER — DEXAMETHASONE 2 MG PO TABS: ORAL_TABLET | ORAL | Status: DC

## 2015-10-11 NOTE — Telephone Encounter (Signed)
Pt LMOM on 10/11/15 at 8:10am stating that he is not tolerating the prednisone very well. He stated that it is keeping him up at night which is causing him to be very tired during the day. He stated that since he has started the prednisone his HR has been over 100 and his BP was 160/120. He wants to know what Dr. Anitra Lauth recommends, can something else be sent in? Please advise. Thanks.

## 2015-10-11 NOTE — Telephone Encounter (Signed)
We can try a different steroid to see if he tolerates it better. I'll send in rx for dexamethasone.

## 2015-10-11 NOTE — Telephone Encounter (Signed)
Pt advised and voiced understanding.   

## 2015-11-30 ENCOUNTER — Other Ambulatory Visit: Payer: Self-pay | Admitting: Family Medicine

## 2015-11-30 NOTE — Telephone Encounter (Signed)
Rx faxed

## 2015-11-30 NOTE — Telephone Encounter (Signed)
RF request for zolpidem LOV: 10/08/15  Next ov: 03/14/16 Last written: 05/26/15 #30 w/ 5RF  Please advise. Thanks.

## 2015-12-16 ENCOUNTER — Other Ambulatory Visit: Payer: Self-pay | Admitting: Family Medicine

## 2015-12-16 NOTE — Telephone Encounter (Signed)
RF request for alprazolam LOV: 10/08/15 Next ov: 03/14/16 Last written: 05/20/15 #120 w/ 5RF  Please advise. Thanks.

## 2015-12-16 NOTE — Telephone Encounter (Signed)
Rx faxed

## 2016-02-28 ENCOUNTER — Ambulatory Visit (INDEPENDENT_AMBULATORY_CARE_PROVIDER_SITE_OTHER): Payer: PRIVATE HEALTH INSURANCE | Admitting: Family Medicine

## 2016-02-28 ENCOUNTER — Ambulatory Visit (HOSPITAL_BASED_OUTPATIENT_CLINIC_OR_DEPARTMENT_OTHER)
Admission: RE | Admit: 2016-02-28 | Discharge: 2016-02-28 | Disposition: A | Discharge status: Home / Self Care | Payer: 59 | Source: Ambulatory Visit | Attending: Family Medicine | Admitting: Family Medicine

## 2016-02-28 ENCOUNTER — Encounter: Payer: Self-pay | Admitting: Family Medicine

## 2016-02-28 VITALS — BP 143/90 | HR 75 | Temp 98.0°F | Resp 16 | Ht 69.75 in | Wt 177.8 lb

## 2016-02-28 DIAGNOSIS — M2011 Hallux valgus (acquired), right foot: Secondary | ICD-10-CM | POA: Diagnosis not present

## 2016-02-28 DIAGNOSIS — M7661 Achilles tendinitis, right leg: Secondary | ICD-10-CM

## 2016-02-28 DIAGNOSIS — M79671 Pain in right foot: Secondary | ICD-10-CM

## 2016-02-28 NOTE — Progress Notes (Signed)
Pre visit review using our clinic review tool, if applicable. No additional management support is needed unless otherwise documented below in the visit note. 

## 2016-02-28 NOTE — Progress Notes (Signed)
OFFICE VISIT  02/28/2016   CC:  Chief Complaint  Patient presents with  . Ankle Pain    right x 3 days   HPI:    Patient is a 65 y.o. Caucasian male who presents for right foot pain. About 5 wks ago he strained his achilles tendon--he [place] an MD [place] the beach and was rx'd a med and this cleared up and went away. This recent w/e he started feeling pain and swelling in focal aspect of R foot dorsolaterally.  Also, his achilles area is feeling painful and swollen again: he can't be clear which came first.  He has been in and out of a lot of equipment again for his job, lots of jumping but no discreet strain felt.  No other joint or musculoskeletal aches/pains.  Took ibup 800 today for the first time in a long time. As the day goes on his foot feels better. He has been wearing a 1/4" heel lift in his boots.  He has been altering his gait over the last month due to ongoing pain in R achilles region.  Past Medical History:  Diagnosis Date  . Anxiety   . BPPV (benign paroxysmal positional vertigo) 06/2014  . Chronic insomnia   . Chronic lumbar pain    DDD; ibuprofen, occasional muscle relaxers.  MRI L-spine 2007 showed some bulging discs with nerve impingement.  Has never had back injection or surgery.  . Contact dermatitis and eczema   . Diverticulitis   . Genital herpes 08/2015   HSV type I on culture  . Gout    "always right big toe"--about one episode every 4 yrs  . History of adenomatous polyp of colon 2005  . History of bronchitis 08/11/2012  . History of Clostridium difficile colitis 2016  . HTN (hypertension)   . Hyperlipidemia, mixed   . PUD (peptic ulcer disease)     Past Surgical History:  Procedure Laterality Date  . COLONOSCOPY  07/2013   Normal.  Recall 10 yrs.  . COLONOSCOPY W/ POLYPECTOMY  2005   Dr. Lyla Son    Outpatient Medications Prior to Visit  Medication Sig Dispense Refill  . ALPRAZolam (XANAX) 0.5 MG tablet take 1 tablet by mouth four times a day if  needed for STRESS 120 tablet 5  . Ascorbic Acid (VITAMIN C) 1000 MG tablet Take 1,000 mg by mouth daily.      Marland Kitchen aspirin 81 MG tablet Take 81 mg by mouth daily.      Marland Kitchen atorvastatin (LIPITOR) 40 MG tablet Take 1 tablet (40 mg total) by mouth daily. 30 tablet 11  . fluticasone (FLONASE) 50 MCG/ACT nasal spray instill 2 sprays into each nostril once daily if needed for allergies 16 g 5  . ibuprofen (ADVIL,MOTRIN) 800 MG tablet 1 tablet 3 times daily with food as needed for pain 90 tablet 5  . metaxalone (SKELAXIN) 800 MG tablet 1/2-1 tab po tid prn 90 tablet 3  . Multiple Vitamin (MULTIVITAMIN) tablet Take 1 tablet by mouth daily.      . Omega-3 Fatty Acids (FISH OIL) 875 MG CAPS Take 2 capsules by mouth daily.      . pantoprazole (PROTONIX) 40 MG tablet take 1 tablet by mouth every morning 30 tablet 11  . saccharomyces boulardii (FLORASTOR) 250 MG capsule Take 1 capsules twice daily. 60 capsule 3  . triamcinolone cream (KENALOG) 0.1 % apply to affected area twice a day 45 g 5  . zolpidem (AMBIEN CR) 12.5 MG CR  tablet take 1 tablet by mouth at bedtime if needed 30 tablet 5  . benzonatate (TESSALON) 200 MG capsule Take 1 capsule (200 mg total) by mouth 3 (three) times daily as needed for cough. (Patient not taking: Reported on 02/28/2016) 30 capsule 0  . dexamethasone (DECADRON) 2 MG tablet 3 tabs po qd x 1d, then 2 tabs po qd x 1d, then 1 tab po qd x 1d (Patient not taking: Reported on 02/28/2016) 6 tablet 0   No facility-administered medications prior to visit.     Allergies  Allergen Reactions  . Prednisone Other (See Comments)    Insomnia, elevated BP and HR  . Augmentin [Amoxicillin-Pot Clavulanate] Diarrhea    Upset stomach    ROS As per HPI  PE: Blood pressure (!) 143/90, pulse 75, temperature 98 F (36.7 C), temperature source Oral, resp. rate 16, height 5' 9.75" (1.772 m), weight 177 lb 12 oz (80.6 kg), SpO2 97 %. Gen: Alert, well appearing.  Patient is oriented to person, place,  time, and situation. AFFECT: pleasant, lucid thought and speech. R achilles region mildly TTP distally.  He has weakness with resisted plantar flexion primarily due to the pain that this maneuver induces in achilles region.  No erythema or swelling.   Mild TTP along dorsolateral aspect of R foot.  No swelling, warmth, or erythema noted.  He shows me a photo on his phone from 2-3 d/a documenting some focal swelling along mid 5th metatarsal region. DP and PT pulses intact bilat.  LABS:  Lab Results  Component Value Date   TSH 3.13 03/15/2015   Lab Results  Component Value Date   WBC 7.9 03/15/2015   HGB 14.8 03/15/2015   HCT 42.7 03/15/2015   MCV 96.0 03/15/2015   PLT 237.0 03/15/2015   Lab Results  Component Value Date   CREATININE 0.91 03/15/2015   BUN 15 03/15/2015   NA 140 03/15/2015   K 3.8 03/15/2015   CL 107 03/15/2015   CO2 24 03/15/2015   Lab Results  Component Value Date   ALT 19 03/15/2015   AST 19 03/15/2015   ALKPHOS 74 03/15/2015   BILITOT 1.7 (H) 03/15/2015   Lab Results  Component Value Date   CHOL 156 03/15/2015   Lab Results  Component Value Date   HDL 43.90 03/15/2015   No results found for: [facility] Lab Results  Component Value Date   TRIG 268.0 (H) 03/15/2015   Lab Results  Component Value Date   CHOLHDL 4 03/15/2015   Lab Results  Component Value Date   PSA 0.38 03/15/2015   PSA 0.53 03/09/2014   PSA 0.39 03/06/2013   Lab Results  Component Value Date   LABURIC 6.0 11/04/2014     IMPRESSION AND PLAN:  1) Achilles tendonitis; suspect this never completely resolved, esp given the report of all the activity he has done on his job lately.  Continue 1/4 " heel cup, ibup 800 mg bid with food x 10d, and will refer to sports medicine to see if anything further is needed.  2) Right dorsolateral foot pain: likely secondary to altered gait brought on by his achilles tendonitis. X-ray foot today to r/o stress fracture (given recent report  of focal swelling).  An After Visit Summary was printed and given to the patient.  FOLLOW UP: Return if symptoms worsen or fail to improve.  Signed:  Crissie Sickles, MD           02/28/2016

## 2016-03-01 ENCOUNTER — Ambulatory Visit (INDEPENDENT_AMBULATORY_CARE_PROVIDER_SITE_OTHER): Payer: 59 | Admitting: Family Medicine

## 2016-03-01 ENCOUNTER — Encounter: Payer: Self-pay | Admitting: Family Medicine

## 2016-03-01 VITALS — BP 139/88 | HR 83 | Ht 70.0 in | Wt 178.0 lb

## 2016-03-01 DIAGNOSIS — M25571 Pain in right ankle and joints of right foot: Secondary | ICD-10-CM

## 2016-03-01 DIAGNOSIS — M109 Gout, unspecified: Secondary | ICD-10-CM | POA: Diagnosis not present

## 2016-03-01 MED ORDER — METHYLPREDNISOLONE ACETATE 40 MG/ML IJ SUSP
40.0000 mg | Freq: Once | INTRAMUSCULAR | Status: AC
  Administered 2016-03-01: 40 mg via INTRAMUSCULAR

## 2016-03-01 NOTE — Patient Instructions (Signed)
Your ultrasound, exam is consistent with an acute gout flare. You were given an IM injection of depomedrol (steroid). Continue the ibuprofen as you have been. Icing, compression, elevation to help with the swelling. Call me in a week to let me know how you're doing. If not improving we can consider an injection into the joint, oral steroid dose pack, prescription anti-inflammatory (like the diclofenac you took before). Consider dr. Zoe Lan active series, our green sports insoles with metatarsal pads. Avoid flat shoes, barefoot walking as much as possible for next 4-6 weeks. Follow up with me in 4 weeks.

## 2016-03-02 NOTE — Progress Notes (Signed)
PCP and consultation requested by: Tammi Sou, MD  Subjective:   HPI: Patient is a 65 y.o. male here for right foot pain.  Patient reports he's had off and on right foot and ankle pain for a year. Current issue started June 30th when he was at the beach - started to get pain, limping more in achilles. Was given diclofenac which he took for 2 weeks and pain resolved. However over past several days pain has recurred with significant swelling of the ankle. Pain is now anterior, posterior, travels from dorsolateral aspect of foot/ankle across to medial. Swelling has improved some. Does have prior history of gout flares - used to get these every 1-2 years. No skin changes, numbness. Radiographs were negative for anything acute. Using heel lifts.  Past Medical History:  Diagnosis Date  . Anxiety   . BPPV (benign paroxysmal positional vertigo) 06/2014  . Chronic insomnia   . Chronic lumbar pain    DDD; ibuprofen, occasional muscle relaxers.  MRI L-spine 2007 showed some bulging discs with nerve impingement.  Has never had back injection or surgery.  . Contact dermatitis and eczema   . Diverticulitis   . Genital herpes 08/2015   HSV type I on culture  . Gout    "always right big toe"--about one episode every 4 yrs  . History of adenomatous polyp of colon 2005  . History of bronchitis 08/11/2012  . History of Clostridium difficile colitis 2016  . HTN (hypertension)   . Hyperlipidemia, mixed   . PUD (peptic ulcer disease)     Current Outpatient Prescriptions on File Prior to Visit  Medication Sig Dispense Refill  . ALPRAZolam (XANAX) 0.5 MG tablet take 1 tablet by mouth four times a day if needed for STRESS 120 tablet 5  . Ascorbic Acid (VITAMIN C) 1000 MG tablet Take 1,000 mg by mouth daily.      Marland Kitchen aspirin 81 MG tablet Take 81 mg by mouth daily.      Marland Kitchen atorvastatin (LIPITOR) 40 MG tablet Take 1 tablet (40 mg total) by mouth daily. 30 tablet 11  . fluticasone (FLONASE) 50  MCG/ACT nasal spray instill 2 sprays into each nostril once daily if needed for allergies 16 g 5  . ibuprofen (ADVIL,MOTRIN) 800 MG tablet 1 tablet 3 times daily with food as needed for pain 90 tablet 5  . metaxalone (SKELAXIN) 800 MG tablet 1/2-1 tab po tid prn 90 tablet 3  . Multiple Vitamin (MULTIVITAMIN) tablet Take 1 tablet by mouth daily.      . Omega-3 Fatty Acids (FISH OIL) 875 MG CAPS Take 2 capsules by mouth daily.      . pantoprazole (PROTONIX) 40 MG tablet take 1 tablet by mouth every morning 30 tablet 11  . saccharomyces boulardii (FLORASTOR) 250 MG capsule Take 1 capsules twice daily. 60 capsule 3  . triamcinolone cream (KENALOG) 0.1 % apply to affected area twice a day 45 g 5  . zolpidem (AMBIEN CR) 12.5 MG CR tablet take 1 tablet by mouth at bedtime if needed 30 tablet 5   No current facility-administered medications on file prior to visit.     Past Surgical History:  Procedure Laterality Date  . COLONOSCOPY  07/2013   Normal.  Recall 10 yrs.  . COLONOSCOPY W/ POLYPECTOMY  2005   Dr. Lyla Son    Allergies  Allergen Reactions  . Prednisone Other (See Comments)    Insomnia, elevated BP and HR  . Augmentin [Amoxicillin-Pot Clavulanate] Diarrhea  Upset stomach    Social History   Social History  . Marital status: Married    Spouse name: N/A  . Number of children: 3  . Years of education: N/A   Occupational History  . sales    Social History Main Topics  . Smoking status: Never Smoker  . Smokeless tobacco: Never Used  . Alcohol use 8.4 oz/week    14 Cans of beer per week  . Drug use: No  . Sexual activity: Yes   Other Topics Concern  . Not on file   Social History Narrative   Married, 3 grown children.   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   Lived in [place] area all his life.   No T/A/Ds.   Walks 3-4 times per day.    Family History  Problem Relation Age of Onset  . Heart attack Father 78  . Heart disease Father     MI age 81  . Heart  disease Other   . Colon cancer Neg Hx   . Colon polyps Mother   . Ovarian cancer Sister     BP 139/88   Pulse 83   Ht 5\' 10"  (1.778 m)   Wt 178 lb (80.7 kg)   BMI 25.54 kg/m   Review of Systems: See HPI above.    Objective:  Physical Exam:  Gen: NAD, comfortable in exam room  Right foot/ankle: Mild diffuse ankle swelling.  No bruising, other deformity. FROM ankle with mild pain all directions. TTP over anterior ankle joint, proximal dorsal foot.  No achilles tenderness currently.  No other tenderness. Negative ant drawer and talar tilt.   Negative syndesmotic compression. Negative calcaneal squeeze. Thompsons test negative. NV intact distally.  Left foot/ankle: FROM without pain.  MSK u/s right ankle:  Peroneal, medial ankle tendons appear normal without tearing, neovascularity.  Achilles without thickening, neovascularity, spurring.  Ankle effusion noted with hyperechoic areas in this consistent with crystal formation.    Assessment & Plan:  1. Right ankle pain - patient's current issue, history, exam, ultrasound all consistent with acute gout flare.  We discussed options (cortisone injection, aspiration, IM injection, oral steroids, nsaids, colchicine) - he opted to try IM depomedrol 80mg  which was given today.  Icing, compression, elevation, ibuprofen.  Consider arch supports.  F/u in 4 weeks but advised to call us to let us know how he's doing in a week.

## 2016-03-03 DIAGNOSIS — M25571 Pain in right ankle and joints of right foot: Secondary | ICD-10-CM | POA: Insufficient documentation

## 2016-03-03 NOTE — Assessment & Plan Note (Signed)
patient's current issue, history, exam, ultrasound all consistent with acute gout flare.  We discussed options (cortisone injection, aspiration, IM injection, oral steroids, nsaids, colchicine) - he opted to try IM depomedrol 80mg  which was given today.  Icing, compression, elevation, ibuprofen.  Consider arch supports.  F/u in 4 weeks but advised to call us to let us know how he's doing in a week.

## 2016-03-05 ENCOUNTER — Encounter: Payer: Self-pay | Admitting: Family Medicine

## 2016-03-08 ENCOUNTER — Telehealth: Payer: Self-pay | Admitting: Family Medicine

## 2016-03-10 ENCOUNTER — Encounter: Payer: Self-pay | Admitting: Family Medicine

## 2016-03-10 ENCOUNTER — Ambulatory Visit (INDEPENDENT_AMBULATORY_CARE_PROVIDER_SITE_OTHER): Payer: 59 | Admitting: Family Medicine

## 2016-03-10 DIAGNOSIS — M25571 Pain in right ankle and joints of right foot: Secondary | ICD-10-CM

## 2016-03-10 MED ORDER — DICLOFENAC SODIUM 75 MG PO TBEC: 75.0000 mg | DELAYED_RELEASE_TABLET | Freq: Two times a day (BID) | ORAL | 1 refills | Status: DC

## 2016-03-10 NOTE — Patient Instructions (Signed)
Take the diclofenac twice a day with food instead of the ibuprofen while you're on vacation. When you return I would stop this and switch back to the ibuprofen you take for your back. Call me if you have any problems.

## 2016-03-14 ENCOUNTER — Encounter: Payer: Self-pay | Admitting: Family Medicine

## 2016-03-14 ENCOUNTER — Encounter: Payer: PRIVATE HEALTH INSURANCE | Admitting: Family Medicine

## 2016-03-15 NOTE — Progress Notes (Signed)
PCP and consultation requested by: Tammi Sou, MD  Subjective:   HPI: Patient is a 65 y.o. male here for right foot pain.  8/16: Patient reports he's had off and on right foot and ankle pain for a year. Current issue started June 30th when he was at the beach - started to get pain, limping more in achilles. Was given diclofenac which he took for 2 weeks and pain resolved. However over past several days pain has recurred with significant swelling of the ankle. Pain is now anterior, posterior, travels from dorsolateral aspect of foot/ankle across to medial. Swelling has improved some. Does have prior history of gout flares - used to get these every 1-2 years. No skin changes, numbness. Radiographs were negative for anything acute. Using heel lifts.  8/25: Patient returns and is doing well. No pain - 0/10 level. Some discomfort. Reports within 30 hours of IM depomedrol he was better. Is going on vacation for 10 days - a little worried this will come back. No other complaints. No skin changes, numbness.  Past Medical History:  Diagnosis Date  . Anxiety   . BPPV (benign paroxysmal positional vertigo) 06/2014  . Chronic insomnia   . Chronic lumbar pain    DDD; ibuprofen, occasional muscle relaxers.  MRI L-spine 2007 showed some bulging discs with nerve impingement.  Has never had back injection or surgery.  . Contact dermatitis and eczema   . Diverticulitis   . Genital herpes 08/2015   HSV type I on culture  . Gout    "always right big toe"--about one episode every 4 yrs.  Right ankle flare, 02/2016.  Marland Kitchen History of adenomatous polyp of colon 2005  . History of bronchitis 08/11/2012  . History of Clostridium difficile colitis 2016  . HTN (hypertension)   . Hyperlipidemia, mixed   . PUD (peptic ulcer disease)     Current Outpatient Prescriptions on File Prior to Visit  Medication Sig Dispense Refill  . ALPRAZolam (XANAX) 0.5 MG tablet take 1 tablet by mouth four times a  day if needed for STRESS 120 tablet 5  . Ascorbic Acid (VITAMIN C) 1000 MG tablet Take 1,000 mg by mouth daily.      Marland Kitchen aspirin 81 MG tablet Take 81 mg by mouth daily.      Marland Kitchen atorvastatin (LIPITOR) 40 MG tablet Take 1 tablet (40 mg total) by mouth daily. 30 tablet 11  . fluticasone (FLONASE) 50 MCG/ACT nasal spray instill 2 sprays into each nostril once daily if needed for allergies 16 g 5  . ibuprofen (ADVIL,MOTRIN) 800 MG tablet 1 tablet 3 times daily with food as needed for pain 90 tablet 5  . metaxalone (SKELAXIN) 800 MG tablet 1/2-1 tab po tid prn 90 tablet 3  . Multiple Vitamin (MULTIVITAMIN) tablet Take 1 tablet by mouth daily.      . Omega-3 Fatty Acids (FISH OIL) 875 MG CAPS Take 2 capsules by mouth daily.      . pantoprazole (PROTONIX) 40 MG tablet take 1 tablet by mouth every morning 30 tablet 11  . saccharomyces boulardii (FLORASTOR) 250 MG capsule Take 1 capsules twice daily. 60 capsule 3  . triamcinolone cream (KENALOG) 0.1 % apply to affected area twice a day 45 g 5  . zolpidem (AMBIEN CR) 12.5 MG CR tablet take 1 tablet by mouth at bedtime if needed 30 tablet 5   No current facility-administered medications on file prior to visit.     Past Surgical History:  Procedure Laterality  Date  . COLONOSCOPY  07/2013   Normal.  Recall 10 yrs.  . COLONOSCOPY W/ POLYPECTOMY  2005   Dr. Lyla Son    Allergies  Allergen Reactions  . Prednisone Other (See Comments)    Insomnia, elevated BP and HR  . Augmentin [Amoxicillin-Pot Clavulanate] Diarrhea    Upset stomach    Social History   Social History  . Marital status: Married    Spouse name: N/A  . Number of children: 3  . Years of education: N/A   Occupational History  . sales    Social History Main Topics  . Smoking status: Never Smoker  . Smokeless tobacco: Never Used  . Alcohol use 8.4 oz/week    14 Cans of beer per week  . Drug use: No  . Sexual activity: Yes   Other Topics Concern  . Not on file   Social  History Narrative   Married, 3 grown children.   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   Lived in [place] area all his life.   No T/A/Ds.   Walks 3-4 times per day.    Family History  Problem Relation Age of Onset  . Heart attack Father 78  . Heart disease Father     MI age 56  . Heart disease Other   . Colon cancer Neg Hx   . Colon polyps Mother   . Ovarian cancer Sister     BP (!) 146/93   Pulse 77   Ht 5\' 10"  (1.778 m)   Wt 178 lb (80.7 kg)   BMI 25.54 kg/m   Review of Systems: See HPI above.    Objective:  Physical Exam:  Gen: NAD, comfortable in exam room  Right foot/ankle: Minimal swelling of ankle.  No bruising, other deformity. FROM ankle without pain. TTP minimally anterior ankle joint.  No other tenderness. Negative ant drawer and talar tilt.   Thompsons test negative. NV intact distally.  Left foot/ankle: FROM without pain.    Assessment & Plan:  1. Right ankle pain - 2/2 resolving acute gout flare.  S/p IM depomedrol and doing well.  Advised against intraarticular injection at this time as he's improving.  He will take diclofenac twice a day until complete resolution instead of his usual ibuprofen.  Icing as needed.  Call us if he has any problems.  Otherwise f/u prn.

## 2016-03-15 NOTE — Assessment & Plan Note (Signed)
2/2 resolving acute gout flare.  S/p IM depomedrol and doing well.  Advised against intraarticular injection at this time as he's improving.  He will take diclofenac twice a day until complete resolution instead of his usual ibuprofen.  Icing as needed.  Call us if he has any problems.  Otherwise f/u prn.

## 2016-03-29 ENCOUNTER — Ambulatory Visit: Payer: 59 | Admitting: Family Medicine

## 2016-03-30 ENCOUNTER — Ambulatory Visit (INDEPENDENT_AMBULATORY_CARE_PROVIDER_SITE_OTHER): Payer: 59 | Admitting: Family Medicine

## 2016-03-30 ENCOUNTER — Encounter: Payer: Self-pay | Admitting: Family Medicine

## 2016-03-30 VITALS — BP 150/89 | HR 82 | Temp 98.4°F | Resp 20 | Ht 71.0 in | Wt 175.0 lb

## 2016-03-30 DIAGNOSIS — Z0000 Encounter for general adult medical examination without abnormal findings: Secondary | ICD-10-CM

## 2016-03-30 DIAGNOSIS — Z125 Encounter for screening for malignant neoplasm of prostate: Secondary | ICD-10-CM

## 2016-03-30 LAB — COMPREHENSIVE METABOLIC PANEL
ALT: 21 U/L (ref 0–53)
AST: 19 U/L (ref 0–37)
Albumin: 4.5 g/dL (ref 3.5–5.2)
Alkaline Phosphatase: 80 U/L (ref 39–117)
BUN: 16 mg/dL (ref 6–23)
CO2: 27 mEq/L (ref 19–32)
Calcium: 9.1 mg/dL (ref 8.4–10.5)
Chloride: 106 mEq/L (ref 96–112)
Creatinine, Ser: 0.91 mg/dL (ref 0.40–1.50)
GFR: 89.42 mL/min (ref >60.00)
Glucose, Bld: 83 mg/dL (ref 70–99)
Potassium: 4 mEq/L (ref 3.5–5.1)
Sodium: 140 mEq/L (ref 135–145)
Total Bilirubin: 1.5 mg/dL — ABNORMAL HIGH (ref 0.2–1.2)
Total Protein: 6.5 g/dL (ref 6.0–8.3)

## 2016-03-30 LAB — CBC WITH DIFFERENTIAL/PLATELET
Basophils Absolute: 0 10*3/uL (ref 0.0–0.1)
Basophils Relative: 0.1 % (ref 0.0–3.0)
Eosinophils Absolute: 0.6 10*3/uL (ref 0.0–0.7)
Eosinophils Relative: 8.1 % — ABNORMAL HIGH (ref 0.0–5.0)
HCT: 44 % (ref 39.0–52.0)
Hemoglobin: 15.5 g/dL (ref 13.0–17.0)
Lymphocytes Relative: 27.4 % (ref 12.0–46.0)
Lymphs Abs: 2 10*3/uL (ref 0.7–4.0)
MCHC: 35.3 g/dL (ref 30.0–36.0)
MCV: 97.6 fl (ref 78.0–100.0)
Monocytes Absolute: 0.5 10*3/uL (ref 0.1–1.0)
Monocytes Relative: 7 % (ref 3.0–12.0)
Neutro Abs: 4.2 10*3/uL (ref 1.4–7.7)
Neutrophils Relative %: 57.4 % (ref 43.0–77.0)
Platelets: 226 10*3/uL (ref 150.0–400.0)
RBC: 4.51 Mil/uL (ref 4.22–5.81)
RDW: 12.4 % (ref 11.5–15.5)
WBC: 7.3 10*3/uL (ref 4.0–10.5)

## 2016-03-30 LAB — LIPID PANEL
Cholesterol: 215 mg/dL — ABNORMAL HIGH (ref 0–200)
HDL: 54.2 mg/dL (ref >39.00)
NonHDL: 161.23
Total CHOL/HDL Ratio: 4
Triglycerides: 275 mg/dL — ABNORMAL HIGH (ref 0.0–149.0)
VLDL: 55 mg/dL — ABNORMAL HIGH (ref 0.0–40.0)

## 2016-03-30 LAB — TSH: TSH: 3.09 u[IU]/mL (ref 0.35–4.50)

## 2016-03-30 LAB — LDL CHOLESTEROL, DIRECT: Direct LDL: 127 mg/dL

## 2016-03-30 LAB — PSA: PSA: 0.47 ng/mL (ref 0.10–4.00)

## 2016-03-30 NOTE — Progress Notes (Signed)
Office Note 03/30/2016  CC:  Chief Complaint  Patient presents with  . Annual Exam    HPI:  Keith Sanders is a 65 y.o. White male who is here for annual health maintenance exam. Unfortunately, patient is a bit upset today due to the recent death of his brother in law from leukemia. He feels like his elevated bp today is due to this.  Home bp monitoring has been normal.  Exercise: walking a lot every day on the job. Diet: gradually getting healthier.   Past Medical History:  Diagnosis Date  . Anxiety   . BPPV (benign paroxysmal positional vertigo) 06/2014  . Chronic insomnia   . Chronic lumbar pain    DDD; ibuprofen, occasional muscle relaxers.  MRI L-spine 2007 showed some bulging discs with nerve impingement.  Has never had back injection or surgery.  . Contact dermatitis and eczema   . Diverticulitis   . Genital herpes 08/2015   HSV type I on culture  . Gout    "always right big toe"--about one episode every 4 yrs.  Right ankle flare, 02/2016.  Marland Kitchen History of adenomatous polyp of colon 2005  . History of bronchitis 08/11/2012  . History of Clostridium difficile colitis 2016  . HTN (hypertension)   . Hyperlipidemia, mixed   . PUD (peptic ulcer disease)     Past Surgical History:  Procedure Laterality Date  . COLONOSCOPY  07/2013   Normal.  Recall 10 yrs.  . COLONOSCOPY W/ POLYPECTOMY  2005   Dr. Lyla Son    Family History  Problem Relation Age of Onset  . Heart attack Father 81  . Heart disease Father     MI age 69  . Heart disease Other   . Colon polyps Mother   . Ovarian cancer Sister   . Colon cancer Neg Hx     Social History   Social History  . Marital status: Married    Spouse name: N/A  . Number of children: 3  . Years of education: N/A   Occupational History  . sales    Social History Main Topics  . Smoking status: Never Smoker  . Smokeless tobacco: Never Used  . Alcohol use 8.4 oz/week    14 Cans of beer per week  . Drug use: No  .  Sexual activity: Yes   Other Topics Concern  . Not on file   Social History Narrative   Married, 3 grown children.   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   Lived in [place] area all his life.   No T/A/Ds.   Walks 3-4 times per day.    Outpatient Medications Prior to Visit  Medication Sig Dispense Refill  . ALPRAZolam (XANAX) 0.5 MG tablet take 1 tablet by mouth four times a day if needed for STRESS 120 tablet 5  . Ascorbic Acid (VITAMIN C) 1000 MG tablet Take 1,000 mg by mouth daily.      Marland Kitchen aspirin 81 MG tablet Take 81 mg by mouth daily.      . diclofenac (VOLTAREN) 75 MG EC tablet Take 1 tablet (75 mg total) by mouth 2 (two) times daily. 60 tablet 1  . fluticasone (FLONASE) 50 MCG/ACT nasal spray instill 2 sprays into each nostril once daily if needed for allergies 16 g 5  . ibuprofen (ADVIL,MOTRIN) 800 MG tablet 1 tablet 3 times daily with food as needed for pain 90 tablet 5  . metaxalone (SKELAXIN) 800 MG tablet 1/2-1 tab po tid prn 90  tablet 3  . Multiple Vitamin (MULTIVITAMIN) tablet Take 1 tablet by mouth daily.      . Omega-3 Fatty Acids (FISH OIL) 875 MG CAPS Take 2 capsules by mouth daily.      . pantoprazole (PROTONIX) 40 MG tablet take 1 tablet by mouth every morning 30 tablet 11  . saccharomyces boulardii (FLORASTOR) 250 MG capsule Take 1 capsules twice daily. 60 capsule 3  . triamcinolone cream (KENALOG) 0.1 % apply to affected area twice a day 45 g 5  . zolpidem (AMBIEN CR) 12.5 MG CR tablet take 1 tablet by mouth at bedtime if needed 30 tablet 5  . atorvastatin (LIPITOR) 40 MG tablet Take 1 tablet (40 mg total) by mouth daily. (Patient not taking: Reported on 03/30/2016) 30 tablet 11   No facility-administered medications prior to visit.     Allergies  Allergen Reactions  . Prednisone Other (See Comments)    Insomnia, elevated BP and HR  . Augmentin [Amoxicillin-Pot Clavulanate] Diarrhea    Upset stomach    ROS Review of Systems  Constitutional: Negative for  appetite change, chills, fatigue and fever.  HENT: Negative for congestion, dental problem, ear pain and sore throat.   Eyes: Negative for discharge, redness and visual disturbance.  Respiratory: Negative for cough, chest tightness, shortness of breath and wheezing.   Cardiovascular: Negative for chest pain, palpitations and leg swelling.  Gastrointestinal: Negative for abdominal pain, blood in stool, diarrhea, nausea and vomiting.  Genitourinary: Negative for difficulty urinating, dysuria, flank pain, frequency, hematuria and urgency.  Musculoskeletal: Negative for arthralgias, back pain, joint swelling, myalgias and neck stiffness.  Skin: Negative for pallor and rash.  Neurological: Negative for dizziness, speech difficulty, weakness and headaches.  Hematological: Negative for adenopathy. Does not bruise/bleed easily.  Psychiatric/Behavioral: Negative for confusion and sleep disturbance. The patient is not nervous/anxious.     PE; Blood pressure (!) 150/89, pulse 82, temperature 98.4 F (36.9 C), temperature source Oral, resp. rate 20, height 5\' 11"  (1.803 m), weight 175 lb (79.4 kg), SpO2 98 %. Gen: Alert, well appearing.  Patient is oriented to person, place, time, and situation. AFFECT: pleasant, lucid thought and speech. ENT: Ears: EACs clear, normal epithelium.  TMs with good light reflex and landmarks bilaterally.  Eyes: no injection, icteris, swelling, or exudate.  EOMI, PERRLA. Nose: no drainage or turbinate edema/swelling.  No injection or focal lesion.  Mouth: lips without lesion/swelling.  Oral mucosa pink and moist.  Dentition intact and without obvious caries or gingival swelling.  Oropharynx without erythema, exudate, or swelling.  Neck: supple/nontender.  No LAD, mass, or TM.  Carotid pulses 2+ bilaterally, without bruits. CV: RRR, no m/r/g.   LUNGS: CTA bilat, nonlabored resps, good aeration in all lung fields. ABD: soft, NT, ND, BS normal.  No hepatospenomegaly or mass.  No  bruits. EXT: no clubbing, cyanosis, or edema.  Musculoskeletal: no joint swelling, erythema, warmth, or tenderness.  ROM of all joints intact. Skin - no sores or suspicious lesions or rashes or color changes Rectal exam: negative without mass, lesions or tenderness, PROSTATE EXAM: smooth and symmetric without nodules or tenderness.   Pertinent labs:  Lab Results  Component Value Date   TSH 3.13 03/15/2015   Lab Results  Component Value Date   WBC 7.9 03/15/2015   HGB 14.8 03/15/2015   HCT 42.7 03/15/2015   MCV 96.0 03/15/2015   PLT 237.0 03/15/2015   Lab Results  Component Value Date   CREATININE 0.91 03/15/2015   BUN  15 03/15/2015   NA 140 03/15/2015   K 3.8 03/15/2015   CL 107 03/15/2015   CO2 24 03/15/2015   Lab Results  Component Value Date   ALT 19 03/15/2015   AST 19 03/15/2015   ALKPHOS 74 03/15/2015   BILITOT 1.7 (H) 03/15/2015   Lab Results  Component Value Date   CHOL 156 03/15/2015   Lab Results  Component Value Date   HDL 43.90 03/15/2015   No results found for: [facility] Lab Results  Component Value Date   TRIG 268.0 (H) 03/15/2015   Lab Results  Component Value Date   CHOLHDL 4 03/15/2015   Lab Results  Component Value Date   PSA 0.38 03/15/2015   PSA 0.53 03/09/2014   PSA 0.39 03/06/2013    Lab Results  Component Value Date   LABURIC 6.0 11/04/2014     ASSESSMENT AND PLAN:   Health maintenance exam: Reviewed age and gender appropriate health maintenance issues (prudent diet, regular exercise, health risks of tobacco and excessive alcohol, use of seatbelts, fire alarms in home, use of sunscreen).  Also reviewed age and gender appropriate health screening as well as vaccine recommendations. He declined flu vaccine today. Next colonoscopy due 2025. DRE normal today, PSA drawn today.  An After Visit Summary was printed and given to the patient.  FOLLOW UP:  Return in about 6 months (around 09/27/2016) for routine chronic illness  f/u.  Signed:  Crissie Sickles, MD           03/30/2016

## 2016-04-20 ENCOUNTER — Ambulatory Visit (INDEPENDENT_AMBULATORY_CARE_PROVIDER_SITE_OTHER): Payer: 59 | Admitting: Family Medicine

## 2016-04-20 ENCOUNTER — Encounter: Payer: Self-pay | Admitting: Family Medicine

## 2016-04-20 VITALS — BP 146/94 | HR 73 | Temp 97.6°F | Resp 16 | Wt 173.8 lb

## 2016-04-20 DIAGNOSIS — H65111 Acute and subacute allergic otitis media (mucoid) (sanguinous) (serous), right ear: Secondary | ICD-10-CM | POA: Diagnosis not present

## 2016-04-20 DIAGNOSIS — H9193 Unspecified hearing loss, bilateral: Secondary | ICD-10-CM

## 2016-04-20 DIAGNOSIS — H6983 Other specified disorders of Eustachian tube, bilateral: Secondary | ICD-10-CM

## 2016-04-20 NOTE — Patient Instructions (Signed)
Stop cefdinir (antibiotic) Start claritin 10mg  daily. Start otc afrin (generic) as directed on the packaging.

## 2016-04-20 NOTE — Progress Notes (Signed)
OFFICE VISIT  04/20/2016   CC:  Chief Complaint  Patient presents with  . Otalgia   HPI:    Patient is a 65 y.o. Caucasian male who presents for "ear infection". Onset of URI 2+ wks ago, then got severe R ear ache.  Went to UC and was dx'd with AOM and rx'd z pack. Then 2 d later had onset of pain and clogged up feeling in L ear.  About a week later he returned to same UC b/c sx's not much better and was rx'd cefdinir and also prednisone (20mg  qd x 3d, now on 10 mg qd--this med causes him irritability and insomnia). Currently taking flonase and sudafed.  He tries to open his ears by blowing against a closed nose but says this feels minimal change from this.  Hearing quite a bit down.  All pain is gone now.  No fevers.  NO more URI sx's.   Past Medical History:  Diagnosis Date  . Anxiety   . BPPV (benign paroxysmal positional vertigo) 06/2014  . Chronic insomnia   . Chronic lumbar pain    DDD; ibuprofen, occasional muscle relaxers.  MRI L-spine 2007 showed some bulging discs with nerve impingement.  Has never had back injection or surgery.  . Contact dermatitis and eczema   . Diverticulitis   . Genital herpes 08/2015   HSV type I on culture  . Gout    "always right big toe"--about one episode every 4 yrs.  Right ankle flare, 02/2016.  Marland Kitchen History of adenomatous polyp of colon 2005  . History of bronchitis 08/11/2012  . History of Clostridium difficile colitis 2016  . HTN (hypertension)   . Hyperlipidemia, mixed   . PUD (peptic ulcer disease)     Past Surgical History:  Procedure Laterality Date  . COLONOSCOPY  07/2013   Normal.  Recall 10 yrs.  . COLONOSCOPY W/ POLYPECTOMY  2005   Dr. Lyla Son    Outpatient Medications Prior to Visit  Medication Sig Dispense Refill  . ALPRAZolam (XANAX) 0.5 MG tablet take 1 tablet by mouth four times a day if needed for STRESS 120 tablet 5  . Ascorbic Acid (VITAMIN C) 1000 MG tablet Take 1,000 mg by mouth daily.      Marland Kitchen aspirin 81 MG  tablet Take 81 mg by mouth daily.      . diclofenac (VOLTAREN) 75 MG EC tablet Take 1 tablet (75 mg total) by mouth 2 (two) times daily. 60 tablet 1  . fluticasone (FLONASE) 50 MCG/ACT nasal spray instill 2 sprays into each nostril once daily if needed for allergies 16 g 5  . ibuprofen (ADVIL,MOTRIN) 800 MG tablet 1 tablet 3 times daily with food as needed for pain 90 tablet 5  . metaxalone (SKELAXIN) 800 MG tablet 1/2-1 tab po tid prn 90 tablet 3  . Multiple Vitamin (MULTIVITAMIN) tablet Take 1 tablet by mouth daily.      . Omega-3 Fatty Acids (FISH OIL) 875 MG CAPS Take 2 capsules by mouth daily.      . pantoprazole (PROTONIX) 40 MG tablet take 1 tablet by mouth every morning 30 tablet 11  . saccharomyces boulardii (FLORASTOR) 250 MG capsule Take 1 capsules twice daily. 60 capsule 3  . triamcinolone cream (KENALOG) 0.1 % apply to affected area twice a day 45 g 5  . zolpidem (AMBIEN CR) 12.5 MG CR tablet take 1 tablet by mouth at bedtime if needed 30 tablet 5  . atorvastatin (LIPITOR) 40 MG tablet  Take 1 tablet (40 mg total) by mouth daily. (Patient not taking: Reported on 04/20/2016) 30 tablet 11   No facility-administered medications prior to visit.     Allergies  Allergen Reactions  . Prednisone Other (See Comments)    Insomnia, elevated BP and HR  . Augmentin [Amoxicillin-Pot Clavulanate] Diarrhea    Upset stomach    ROS As per HPI  PE: Blood pressure (!) 146/94, pulse 73, temperature 97.6 F (36.4 C), temperature source Oral, resp. rate 16, weight 173 lb 12.8 oz (78.8 kg), SpO2 96 %. Gen: Alert, well appearing.  Patient is oriented to person, place, time, and situation. AFFECT: pleasant, lucid thought and speech. ENT: Ears: EACs clear, normal epithelium.  TMs with good light reflex and landmarks bilaterally--retracted TMs.  I see no fluid in middle ears.  No erythema of TMs at all.  Eyes: no injection, icteris, swelling, or exudate.  EOMI, PERRLA. Nose: no drainage or turbinate  edema/swelling.  No injection or focal lesion.  Mouth: lips without lesion/swelling.  Oral mucosa pink and moist.  Dentition intact and without obvious caries or gingival swelling.  Oropharynx without erythema, exudate, or swelling.    LABS:  none  IMPRESSION AND PLAN:  1) Eustachian tube dysfunction, hx of AOM with URI recently (both now resolved). He can stop the cefdinir.  Stay on flonase, add saline nasal spray, add claritin 10mg  qd, AND add otc afrin nasal spray as directed on the packaging.  Therapeutic expectations and side effect profile of medication discussed today.  Patient's questions answered.  Discussed blowing air out against closed nostrils intermittently to try to open things up. He'll finish out his prednisone that the UC rx'd him--he can only tolerate 10mg  doses at a time. Reassured pt that this should resolve within the next 2 wks before he has to go on an airplane.  An After Visit Summary was printed and given to the patient.  FOLLOW UP: Return if symptoms worsen or fail to improve.  Signed:  Crissie Sickles, MD           04/20/2016

## 2016-04-20 NOTE — Progress Notes (Signed)
Pre visit review using our clinic review tool, if applicable. No additional management support is needed unless otherwise documented below in the visit note. 

## 2016-05-01 ENCOUNTER — Other Ambulatory Visit: Payer: Self-pay | Admitting: Family Medicine

## 2016-05-21 ENCOUNTER — Other Ambulatory Visit: Payer: Self-pay | Admitting: Family Medicine

## 2016-05-22 NOTE — Telephone Encounter (Signed)
RF request for ambien. Last OV 04/20/16. Next OV 04/02/17. Last RX printed 11/30/15 x 5 rfs.   Please advise.

## 2016-05-22 NOTE — Telephone Encounter (Signed)
Faxed to pharmacy

## 2016-06-23 ENCOUNTER — Other Ambulatory Visit: Payer: Self-pay | Admitting: Family Medicine

## 2016-06-26 ENCOUNTER — Telehealth: Payer: Self-pay | Admitting: Family Medicine

## 2016-06-26 NOTE — Telephone Encounter (Signed)
Patient calling to report he was informed by [facility] they sent a prior authorization request for zolpidem (AMBIEN CR) 12.5 MG CR tablet to pcp office last Wednesday (06/21/16).  Patient is calling to confirm if prior authorization request was received and if so, would like the status of this request.

## 2016-06-26 NOTE — Telephone Encounter (Signed)
Left detailed message on pts cell vm stating that we did receive PA from pharmacy but we have not had a chance to send information to pts insurance. This can take a few days to day and we will contact him when we have sent the PA and when we hear back from his insurance. Okay per DPR.

## 2016-06-26 NOTE — Telephone Encounter (Signed)
Rx faxed

## 2016-06-26 NOTE — Telephone Encounter (Signed)
RF request for alprazolam LOV: 03/30/16 Next ov: 04/02/17 Last written: 12/16/15 #120 w/ 5RF  Please advise. Thanks.

## 2016-06-29 ENCOUNTER — Telehealth: Payer: Self-pay

## 2016-06-29 NOTE — Telephone Encounter (Signed)
Patient called inquiring about prior auth for Ambien.  Denial letter came through 06/28/16.  Patient notified of denial.  Patient informed that pharmacy used a voucher for medication and patient will continue to use voucher until it is no longer applicable.  Patient stated that he will try the Ambien 10 mg which the insurance is requiring the patient to try a two week trial, if failure with this then patient can take Ambien ER 12.5 mg.

## 2016-06-29 NOTE — Telephone Encounter (Signed)
Noted  

## 2016-07-17 DIAGNOSIS — A0472 Enterocolitis due to Clostridium difficile, not specified as recurrent: Secondary | ICD-10-CM

## 2016-07-17 HISTORY — DX: Enterocolitis due to Clostridium difficile, not specified as recurrent: A04.72

## 2016-07-18 NOTE — Telephone Encounter (Signed)
Finished

## 2016-08-16 ENCOUNTER — Other Ambulatory Visit: Payer: Self-pay | Admitting: Family Medicine

## 2016-09-06 ENCOUNTER — Other Ambulatory Visit: Payer: Self-pay | Admitting: *Deleted

## 2016-09-06 MED ORDER — TRIAMCINOLONE ACETONIDE 0.1 % EX CREA: TOPICAL_CREAM | CUTANEOUS | 5 refills | Status: DC

## 2016-09-06 NOTE — Telephone Encounter (Signed)
[facility] [address], [place].  RF request for triamcinolone LOV: 04/20/16 Next ov: 04/02/17 Last written: 07/02/15 #45g w/ 5RF

## 2016-09-07 MED ORDER — TRIAMCINOLONE ACETONIDE 0.1 % EX CREA
TOPICAL_CREAM | CUTANEOUS | 5 refills | Status: DC
Start: 2016-09-07 — End: 2017-12-14

## 2016-09-07 NOTE — Addendum Note (Signed)
Addended by: Onalee Hua on: 09/07/2016 09:05 AM   Modules accepted: Orders

## 2016-09-07 NOTE — Telephone Encounter (Signed)
Rx that was approved by Dr. Anitra Lauth was printed, since Dr. Anitra Lauth is not here today (09/07/16) to sign. I re-sent Rx electronically. Printed Rx was voided and put in shred box.

## 2016-11-01 ENCOUNTER — Encounter: Payer: Self-pay | Admitting: Family Medicine

## 2016-11-01 NOTE — Telephone Encounter (Signed)
Keith Sanders will you try to do a PA for this and see if we can get it covered. Thanks.

## 2016-11-02 NOTE — Telephone Encounter (Signed)
Prior authorization sent through Cover My Meds.

## 2016-11-15 ENCOUNTER — Other Ambulatory Visit: Payer: Self-pay | Admitting: Family Medicine

## 2016-11-15 NOTE — Telephone Encounter (Signed)
[facility] [place]  RF request for zolpidem LOV: 04/20/16 Next ov: 04/02/17 Last written: 05/22/16 #30 w/ 5RF  Please advise. Thanks.

## 2016-11-15 NOTE — Telephone Encounter (Signed)
Rx faxed

## 2017-01-05 ENCOUNTER — Other Ambulatory Visit: Payer: Self-pay | Admitting: Family Medicine

## 2017-04-02 ENCOUNTER — Encounter: Payer: 59 | Admitting: Family Medicine

## 2017-04-19 ENCOUNTER — Ambulatory Visit (INDEPENDENT_AMBULATORY_CARE_PROVIDER_SITE_OTHER): Payer: 59 | Admitting: Family Medicine

## 2017-04-19 ENCOUNTER — Encounter: Payer: Self-pay | Admitting: Family Medicine

## 2017-04-19 VITALS — BP 135/83 | HR 75 | Temp 98.2°F | Resp 16 | Ht 70.0 in | Wt 176.0 lb

## 2017-04-19 DIAGNOSIS — Z0000 Encounter for general adult medical examination without abnormal findings: Secondary | ICD-10-CM

## 2017-04-19 DIAGNOSIS — Z125 Encounter for screening for malignant neoplasm of prostate: Secondary | ICD-10-CM | POA: Diagnosis not present

## 2017-04-19 LAB — CBC WITH DIFFERENTIAL/PLATELET
Basophils Absolute: 0 10*3/uL (ref 0.0–0.1)
Basophils Relative: 0.3 % (ref 0.0–3.0)
Eosinophils Absolute: 0.6 10*3/uL (ref 0.0–0.7)
Eosinophils Relative: 8.4 % — ABNORMAL HIGH (ref 0.0–5.0)
HCT: 45.5 % (ref 39.0–52.0)
Hemoglobin: 15.9 g/dL (ref 13.0–17.0)
Lymphocytes Relative: 24.1 % (ref 12.0–46.0)
Lymphs Abs: 1.7 10*3/uL (ref 0.7–4.0)
MCHC: 35 g/dL (ref 30.0–36.0)
MCV: 99.3 fl (ref 78.0–100.0)
Monocytes Absolute: 0.5 10*3/uL (ref 0.1–1.0)
Monocytes Relative: 7.3 % (ref 3.0–12.0)
Neutro Abs: 4.1 10*3/uL (ref 1.4–7.7)
Neutrophils Relative %: 59.9 % (ref 43.0–77.0)
Platelets: 265 10*3/uL (ref 150.0–400.0)
RBC: 4.58 Mil/uL (ref 4.22–5.81)
RDW: 12.3 % (ref 11.5–15.5)
WBC: 6.9 10*3/uL (ref 4.0–10.5)

## 2017-04-19 LAB — COMPREHENSIVE METABOLIC PANEL
ALT: 20 U/L (ref 0–53)
AST: 18 U/L (ref 0–37)
Albumin: 4.6 g/dL (ref 3.5–5.2)
Alkaline Phosphatase: 78 U/L (ref 39–117)
BUN: 11 mg/dL (ref 6–23)
CO2: 25 mEq/L (ref 19–32)
Calcium: 9.6 mg/dL (ref 8.4–10.5)
Chloride: 104 mEq/L (ref 96–112)
Creatinine, Ser: 0.99 mg/dL (ref 0.40–1.50)
GFR: 80.86 mL/min (ref >60.00)
Glucose, Bld: 96 mg/dL (ref 70–99)
Potassium: 4.1 mEq/L (ref 3.5–5.1)
Sodium: 139 mEq/L (ref 135–145)
Total Bilirubin: 1.7 mg/dL — ABNORMAL HIGH (ref 0.2–1.2)
Total Protein: 6.7 g/dL (ref 6.0–8.3)

## 2017-04-19 LAB — LIPID PANEL
Cholesterol: 233 mg/dL — ABNORMAL HIGH (ref 0–200)
HDL: 46.5 mg/dL (ref >39.00)
NonHDL: 186.61
Total CHOL/HDL Ratio: 5
Triglycerides: 326 mg/dL — ABNORMAL HIGH (ref 0.0–149.0)
VLDL: 65.2 mg/dL — ABNORMAL HIGH (ref 0.0–40.0)

## 2017-04-19 LAB — PSA: PSA: 0.41 ng/mL (ref 0.10–4.00)

## 2017-04-19 LAB — LDL CHOLESTEROL, DIRECT: Direct LDL: 147 mg/dL

## 2017-04-19 LAB — TSH: TSH: 4.46 u[IU]/mL (ref 0.35–4.50)

## 2017-04-19 NOTE — Progress Notes (Signed)
Office Note 04/19/2017  CC:  Chief Complaint  Patient presents with  . Annual Exam    Pt is not fasting.    HPI:  Keith Sanders is a 66 y.o. White male who is here for annual health maintenance exam. He did have 2-3 oz of gatorade today so we'll interpret his labs today with this info in mind.  Eyes: none in last 2 yrs. Dental: preventatives UTD. Exercise: 3-4 miles walking per day. Diet: made changes towards lower carbs and lower fat.   Past Medical History:  Diagnosis Date  . Anxiety   . BPPV (benign paroxysmal positional vertigo) 06/2014  . Chronic insomnia   . Chronic lumbar pain    DDD; ibuprofen, occasional muscle relaxers.  MRI L-spine 2007 showed some bulging discs with nerve impingement.  Has never had back injection or surgery.  . Contact dermatitis and eczema   . Diverticulitis   . Genital herpes 08/2015   HSV type I on culture  . Gout    "always right big toe"--about one episode every 4 yrs.  Right ankle flare, 02/2016.  Marland Kitchen History of adenomatous polyp of colon 2005  . History of bronchitis 08/11/2012  . History of Clostridium difficile colitis 2016  . HTN (hypertension)   . Hyperlipidemia, mixed   . PUD (peptic ulcer disease)     Past Surgical History:  Procedure Laterality Date  . COLONOSCOPY  07/2013   Normal.  Recall 10 yrs.  . COLONOSCOPY W/ POLYPECTOMY  2005   Dr. Lyla Son    Family History  Problem Relation Age of Onset  . Heart attack Father 17  . Heart disease Father        MI age 19  . Heart disease Other   . Colon polyps Mother   . Ovarian cancer Sister   . Colon cancer Neg Hx     Social History   Social History  . Marital status: Married    Spouse name: N/A  . Number of children: 3  . Years of education: N/A   Occupational History  . sales    Social History Main Topics  . Smoking status: Never Smoker  . Smokeless tobacco: Never Used  . Alcohol use 8.4 oz/week    14 Cans of beer per week  . Drug use: No  . Sexual  activity: Yes   Other Topics Concern  . Not on file   Social History Narrative   Married, 3 grown children.   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   Lived in [place] area all his life.   No T/A/Ds.   Walks 3-4 times per day.    Outpatient Medications Prior to Visit  Medication Sig Dispense Refill  . ALPRAZolam (XANAX) 0.5 MG tablet take 1 tablet by mouth four times a day if needed for STRESS 120 tablet 5  . Ascorbic Acid (VITAMIN C) 1000 MG tablet Take 1,000 mg by mouth daily.      Marland Kitchen aspirin 81 MG tablet Take 81 mg by mouth daily.      . diclofenac (VOLTAREN) 75 MG EC tablet Take 1 tablet (75 mg total) by mouth 2 (two) times daily. 60 tablet 1  . fluticasone (FLONASE) 50 MCG/ACT nasal spray instill 2 sprays into each nostril once daily if needed for allergies 16 g 5  . ibuprofen (ADVIL,MOTRIN) 800 MG tablet take 1 tablet by mouth three times a day with food if needed for pain 90 tablet 5  . metaxalone (SKELAXIN) 800 MG  tablet 1/2-1 tab po tid prn 90 tablet 3  . Multiple Vitamin (MULTIVITAMIN) tablet Take 1 tablet by mouth daily.      . pantoprazole (PROTONIX) 40 MG tablet take 1 tablet by mouth every morning 30 tablet 11  . saccharomyces boulardii (FLORASTOR) 250 MG capsule Take 1 capsules twice daily. 60 capsule 3  . triamcinolone cream (KENALOG) 0.1 % apply to affected area twice a day 45 g 5  . zolpidem (AMBIEN CR) 12.5 MG CR tablet take 1 tablet by mouth at bedtime if needed 30 tablet 5  . atorvastatin (LIPITOR) 40 MG tablet Take 1 tablet (40 mg total) by mouth daily. (Patient not taking: Reported on 04/20/2016) 30 tablet 11  . cefdinir (OMNICEF) 300 MG capsule     . Omega-3 Fatty Acids (FISH OIL) 875 MG CAPS Take 2 capsules by mouth daily.      . predniSONE (DELTASONE) 20 MG tablet      No facility-administered medications prior to visit.     Allergies  Allergen Reactions  . Prednisone Other (See Comments)    Insomnia, elevated BP and HR  . Augmentin [Amoxicillin-Pot  Clavulanate] Diarrhea    Upset stomach    ROS Review of Systems  Constitutional: Negative for appetite change, chills, fatigue and fever.  HENT: Negative for congestion, dental problem, ear pain and sore throat.   Eyes: Negative for discharge, redness and visual disturbance.  Respiratory: Negative for cough, chest tightness, shortness of breath and wheezing.   Cardiovascular: Negative for chest pain, palpitations and leg swelling.  Gastrointestinal: Negative for abdominal pain, blood in stool, diarrhea, nausea and vomiting.  Genitourinary: Negative for difficulty urinating, dysuria, flank pain, frequency, hematuria and urgency.  Musculoskeletal: Negative for arthralgias, back pain, joint swelling, myalgias and neck stiffness.  Skin: Negative for pallor and rash.  Neurological: Negative for dizziness, speech difficulty, weakness and headaches.  Hematological: Negative for adenopathy. Does not bruise/bleed easily.  Psychiatric/Behavioral: Negative for confusion and sleep disturbance. The patient is not nervous/anxious.     PE; Blood pressure 135/83, pulse 75, temperature 98.2 F (36.8 C), temperature source Oral, resp. rate 16, height 5\' 10"  (1.778 m), weight 176 lb (79.8 kg), SpO2 98 %. Gen: Alert, well appearing.  Patient is oriented to person, place, time, and situation. AFFECT: pleasant, lucid thought and speech. ENT: Ears: EACs clear, normal epithelium.  TMs with good light reflex and landmarks bilaterally.  Eyes: no injection, icteris, swelling, or exudate.  EOMI, PERRLA. Nose: no drainage or turbinate edema/swelling.  No injection or focal lesion.  Mouth: lips without lesion/swelling.  Oral mucosa pink and moist.  Dentition intact and without obvious caries or gingival swelling.  Oropharynx without erythema, exudate, or swelling.  Neck: supple/nontender.  No LAD, mass, or TM.  Carotid pulses 2+ bilaterally, without bruits. CV: RRR, no m/r/g.   LUNGS: CTA bilat, nonlabored resps,  good aeration in all lung fields. ABD: soft, NT, ND, BS normal.  No hepatospenomegaly or mass.  No bruits. EXT: no clubbing, cyanosis, or edema.  Musculoskeletal: no joint swelling, erythema, warmth, or tenderness.  ROM of all joints intact. Skin - no sores or suspicious lesions or rashes or color changes Rectal exam: negative without mass, lesions or tenderness, PROSTATE EXAM: smooth and symmetric without nodules or tenderness.   Pertinent labs:  Lab Results  Component Value Date   TSH 3.09 03/30/2016   Lab Results  Component Value Date   WBC 7.3 03/30/2016   HGB 15.5 03/30/2016   HCT 44.0 03/30/2016  MCV 97.6 03/30/2016   PLT 226.0 03/30/2016   Lab Results  Component Value Date   CREATININE 0.91 03/30/2016   BUN 16 03/30/2016   NA 140 03/30/2016   K 4.0 03/30/2016   CL 106 03/30/2016   CO2 27 03/30/2016   Lab Results  Component Value Date   ALT 21 03/30/2016   AST 19 03/30/2016   ALKPHOS 80 03/30/2016   BILITOT 1.5 (H) 03/30/2016   Lab Results  Component Value Date   CHOL 215 (H) 03/30/2016   Lab Results  Component Value Date   HDL 54.20 03/30/2016   No results found for: [facility] Lab Results  Component Value Date   TRIG 275.0 (H) 03/30/2016   Lab Results  Component Value Date   CHOLHDL 4 03/30/2016   Lab Results  Component Value Date   PSA 0.47 03/30/2016   PSA 0.38 03/15/2015   PSA 0.53 03/09/2014    ASSESSMENT AND PLAN:   Health maintenance exam: Reviewed age and gender appropriate health maintenance issues (prudent diet, regular exercise, health risks of tobacco and excessive alcohol, use of seatbelts, fire alarms in home, use of sunscreen).  Also reviewed age and gender appropriate health screening as well as vaccine recommendations. Vaccines: UTD.  Flu vaccine--declined this today. LABS: fasting HP today. Prostate ca screening: DRE normal today , PSA today. Colon ca screening: next colonoscopy due 07/2023.  An After Visit Summary was  printed and given to the patient.  FOLLOW UP:  Return in about 1 year (around 04/19/2018) for annual CPE (fasting).  Signed:  Crissie Sickles, MD           04/19/2017

## 2017-04-19 NOTE — Patient Instructions (Signed)

## 2017-05-08 ENCOUNTER — Ambulatory Visit (INDEPENDENT_AMBULATORY_CARE_PROVIDER_SITE_OTHER): Payer: 59 | Admitting: Family Medicine

## 2017-05-08 ENCOUNTER — Encounter: Payer: Self-pay | Admitting: Family Medicine

## 2017-05-08 VITALS — BP 147/97 | HR 75 | Resp 16 | Wt 179.0 lb

## 2017-05-08 DIAGNOSIS — M109 Gout, unspecified: Secondary | ICD-10-CM

## 2017-05-08 MED ORDER — METHYLPREDNISOLONE ACETATE 80 MG/ML IJ SUSP
80.0000 mg | Freq: Once | INTRAMUSCULAR | Status: AC
Start: 2017-05-08 — End: 2017-05-08
  Administered 2017-05-08: 80 mg via INTRAMUSCULAR

## 2017-05-08 MED ORDER — PREDNISONE 20 MG PO TABS: ORAL_TABLET | ORAL | 0 refills | Status: DC

## 2017-05-08 NOTE — Patient Instructions (Signed)

## 2017-05-08 NOTE — Progress Notes (Signed)
Keith Sanders , 01-04-1951, 66 y.o., male MRN: [id number] Patient Care Team    Relationship Specialty Notifications Start End  McGowen, Adrian Blackwater, MD PCP - General Family Medicine  07/05/11   Ashok Pall, MD Consulting Physician Neurosurgery  04/18/12   Dene Gentry, MD Consulting Physician Sports Medicine  03/05/16     Chief Complaint  Patient presents with  . Toe Pain    Right great toe "gout"     Subjective: Pt presents for an OV with complaints of gout flare of his right toe of 2-3 weeks duration.  Associated symptoms include redness, swollen and tenderness. He reports he has a gout flare every couple years. He reports he typically watches his diet to help prevent gout flare, however he had been on vacation away from home and not watching his diet eating more seafood. Patient has tried taking 800 mg of ibuprofen up to 3 times a day over the last few weeks to help his discomfort. He reports he is not allergic to prednisone he is just sensitive to very high extended doses(this is on his allergy list).  Depression screen [facility] 2/9 04/19/2017 03/30/2016  Decreased Interest 0 0  Down, Depressed, Hopeless 0 0  PHQ - 2 Score 0 0    Allergies  Allergen Reactions  . Prednisone Other (See Comments)    Insomnia, elevated BP and HR  . Augmentin [Amoxicillin-Pot Clavulanate] Diarrhea    Upset stomach   Social History  Substance Use Topics  . Smoking status: Never Smoker  . Smokeless tobacco: Never Used  . Alcohol use 8.4 oz/week    14 Cans of beer per week   Past Medical History:  Diagnosis Date  . Anxiety   . BPPV (benign paroxysmal positional vertigo) 06/2014  . Chronic insomnia   . Chronic lumbar pain    DDD; ibuprofen, occasional muscle relaxers.  MRI L-spine 2007 showed some bulging discs with nerve impingement.  Has never had back injection or surgery.  . Contact dermatitis and eczema   . Diverticulitis   . Genital herpes 08/2015   HSV type I on culture  . Gout    "always right big toe"--about one episode every 4 yrs.  Right ankle flare, 02/2016.  Marland Kitchen History of adenomatous polyp of colon 2005  . History of bronchitis 08/11/2012  . History of Clostridium difficile colitis 2016  . HTN (hypertension)   . Hyperlipidemia, mixed    Recommended statin 04/2017  . PUD (peptic ulcer disease)    Past Surgical History:  Procedure Laterality Date  . COLONOSCOPY  07/2013   Normal.  Recall 10 yrs.  . COLONOSCOPY W/ POLYPECTOMY  2005   Dr. Lyla Son   Family History  Problem Relation Age of Onset  . Heart attack Father 38  . Heart disease Father        MI age 85  . Heart disease Other   . Colon polyps Mother   . Ovarian cancer Sister   . Colon cancer Neg Hx    Allergies as of 05/08/2017      Reactions   Prednisone Other (See Comments)   Insomnia, elevated BP and HR   Augmentin [amoxicillin-pot Clavulanate] Diarrhea   Upset stomach      Medication List       Accurate as of 05/08/17 11:59 PM. Always use your most recent med list.          ALPRAZolam 0.5 MG tablet Commonly known as:  XANAX take 1  tablet by mouth four times a day if needed for STRESS   aspirin 81 MG tablet Take 81 mg by mouth daily.   diclofenac 75 MG EC tablet Commonly known as:  VOLTAREN Take 1 tablet (75 mg total) by mouth 2 (two) times daily.   fluticasone 50 MCG/ACT nasal spray Commonly known as:  FLONASE instill 2 sprays into each nostril once daily if needed for allergies   ibuprofen 800 MG tablet Commonly known as:  ADVIL,MOTRIN take 1 tablet by mouth three times a day with food if needed for pain   metaxalone 800 MG tablet Commonly known as:  SKELAXIN 1/2-1 tab po tid prn   multivitamin tablet Take 1 tablet by mouth daily.   pantoprazole 40 MG tablet Commonly known as:  PROTONIX take 1 tablet by mouth every morning   predniSONE 20 MG tablet Commonly known as:  DELTASONE 60 mg x1d, 40 mg x2d, 20 mg x2d, 10 mg x2d   saccharomyces boulardii 250 MG  capsule Commonly known as:  FLORASTOR Take 1 capsules twice daily.   triamcinolone cream 0.1 % Commonly known as:  KENALOG apply to affected area twice a day   vitamin C 1000 MG tablet Take 1,000 mg by mouth daily.   zolpidem 12.5 MG CR tablet Commonly known as:  AMBIEN CR take 1 tablet by mouth at bedtime if needed       All past medical history, surgical history, allergies, family history, immunizations andmedications were updated in the EMR today and reviewed under the history and medication portions of their EMR.     ROS: Negative, with the exception of above mentioned in HPI   Objective:  BP (!) 147/97 (BP Location: Left Arm, Patient Position: Sitting, Cuff Size: Normal)   Pulse 75   Resp 16   Wt 179 lb (81.2 kg)   SpO2 97%   BMI 25.68 kg/m  Body mass index is 25.68 kg/m. Gen: Afebrile. No acute distress. Nontoxic in appearance, well developed, well nourished.  HENT: AT. [email or web address].MMM, no oral lesions.  Eyes:Pupils Equal Round Reactive to light, Extraocular movements intact,  Conjunctiva without redness, discharge or icterus. MSK: mild erythema, mild tenderness, mild swelling right distal first metatarsal joint. Decreased range of motion secondary to discomfort. Neurovascularly intact distally. Neuro: Mild limp present. PERLA. EOMi. Alert. Oriented x3  No exam data present No results found. No results found for this or any previous visit (from the past 24 hour(s)).  Assessment/Plan: Keith Sanders is a 66 y.o. male present for OV for  Acute gout involving toe of right foot, unspecified cause - exam consistent with gout flare. Failed resolution over-the-counter NSAIDs for 2-3 weeks. - IM Depo-Medrol injection today. Start short course of prednisone taper tomorrow (patient not allergic only sensitive to high-dose long extended courses). He is agreeable to plan today. - hydrate. Low purine diet. - Follow-up when necessary   Reviewed expectations re: course of current  medical issues.  Discussed self-management of symptoms.  Outlined signs and symptoms indicating need for more acute intervention.  Patient verbalized understanding and all questions were answered.  Patient received an After-Visit Summary.    No orders of the defined types were placed in this encounter.    Note is dictated utilizing voice recognition software. Although note has been proof read prior to signing, occasional typographical errors still can be missed. If any questions arise, please do not hesitate to call for verification.   electronically signed by:  Howard Pouch, DO  [facility] [place]

## 2017-05-14 ENCOUNTER — Telehealth: Payer: Self-pay | Admitting: Family Medicine

## 2017-05-14 MED ORDER — ZOLPIDEM TARTRATE ER 12.5 MG PO TBCR: EXTENDED_RELEASE_TABLET | ORAL | 5 refills | Status: DC

## 2017-05-14 NOTE — Telephone Encounter (Signed)
RF request for ambien. Last RX printed 11/15/16 # 30 x 5 RFs Last OV - CPE 04/17/17. Please advise RF.

## 2017-05-14 NOTE — Telephone Encounter (Signed)
Ambien rx printed

## 2017-05-15 NOTE — Telephone Encounter (Signed)
rx faxed w/ confirmation.

## 2017-05-27 IMAGING — CR DG ANKLE COMPLETE 3+V*L*
3 series · 3 of 3 positions shown · non-contrast
Comparison: None.

CLINICAL DATA: Left ankle pain worse with weight-bearing. No known
injury.

EXAM:
LEFT ANKLE COMPLETE - 3+ VIEW

[t ankle joint ap left]
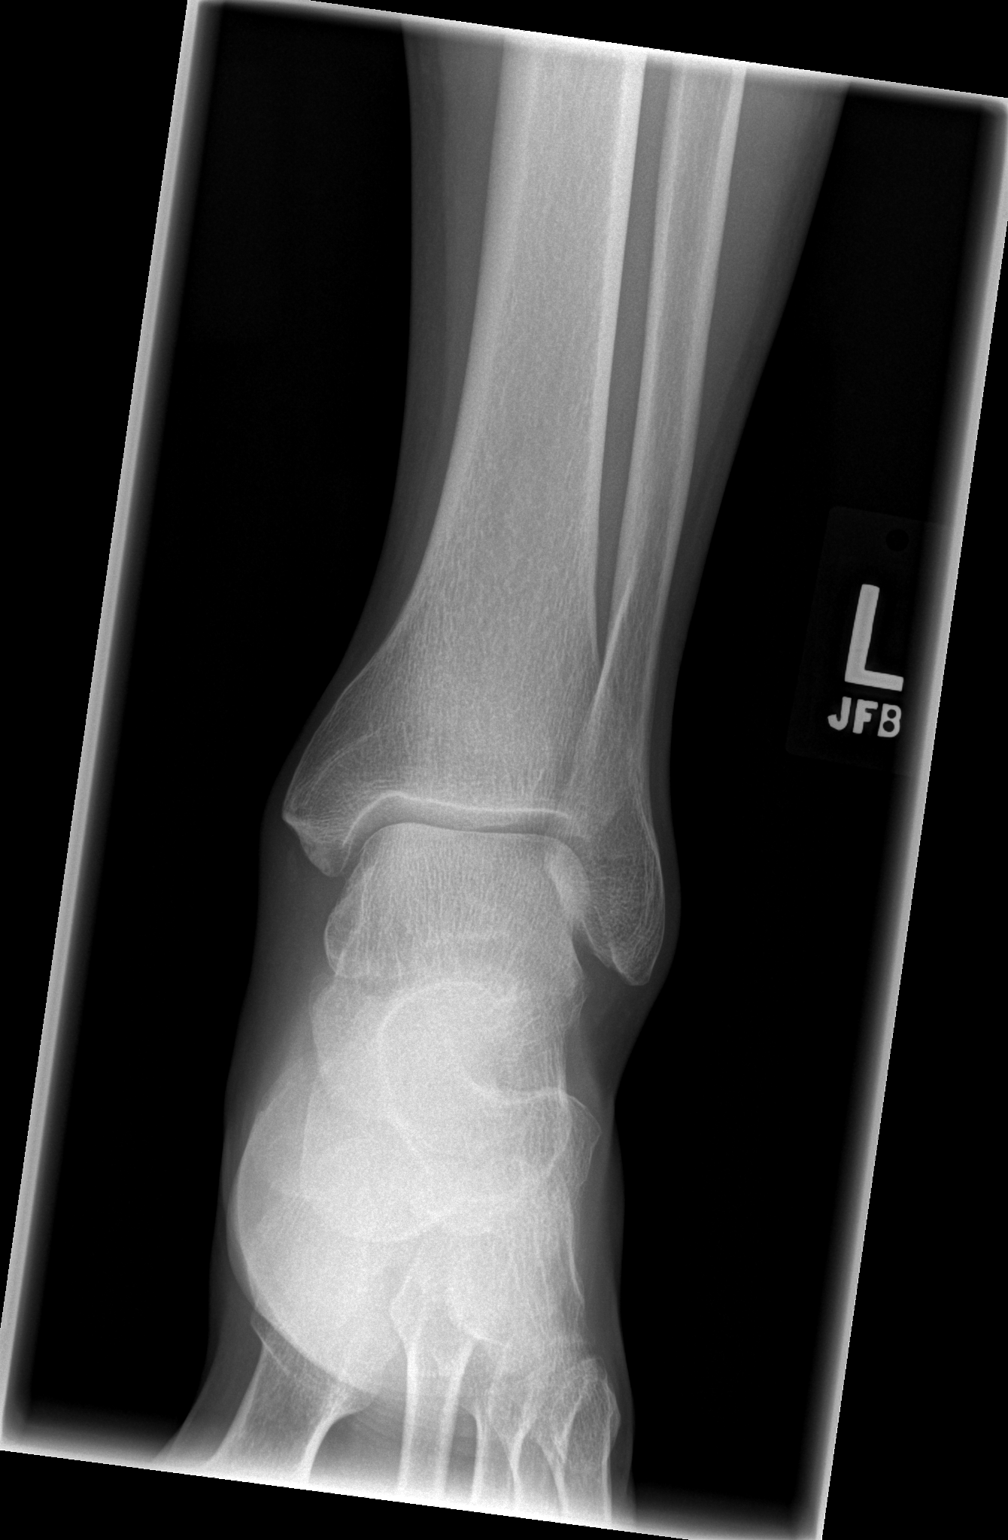

[t ankle joint oblique left]
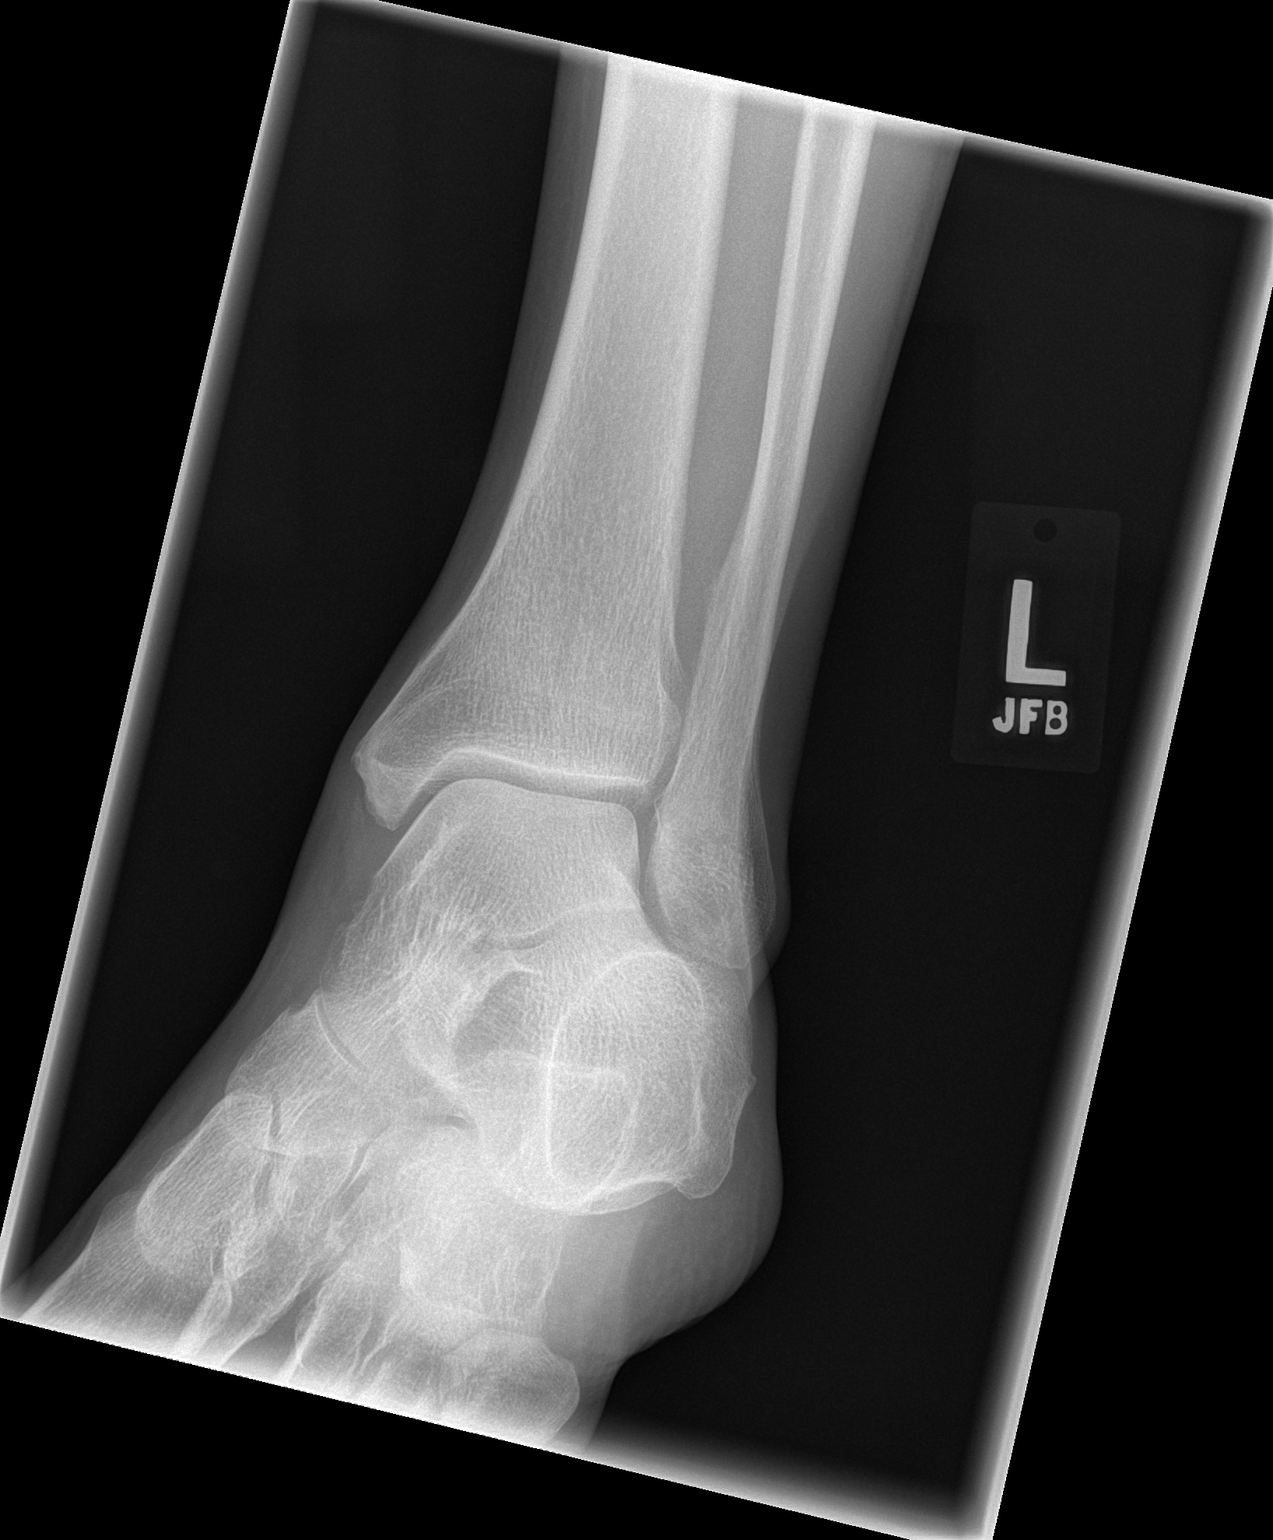

[t ankle joint lat left]
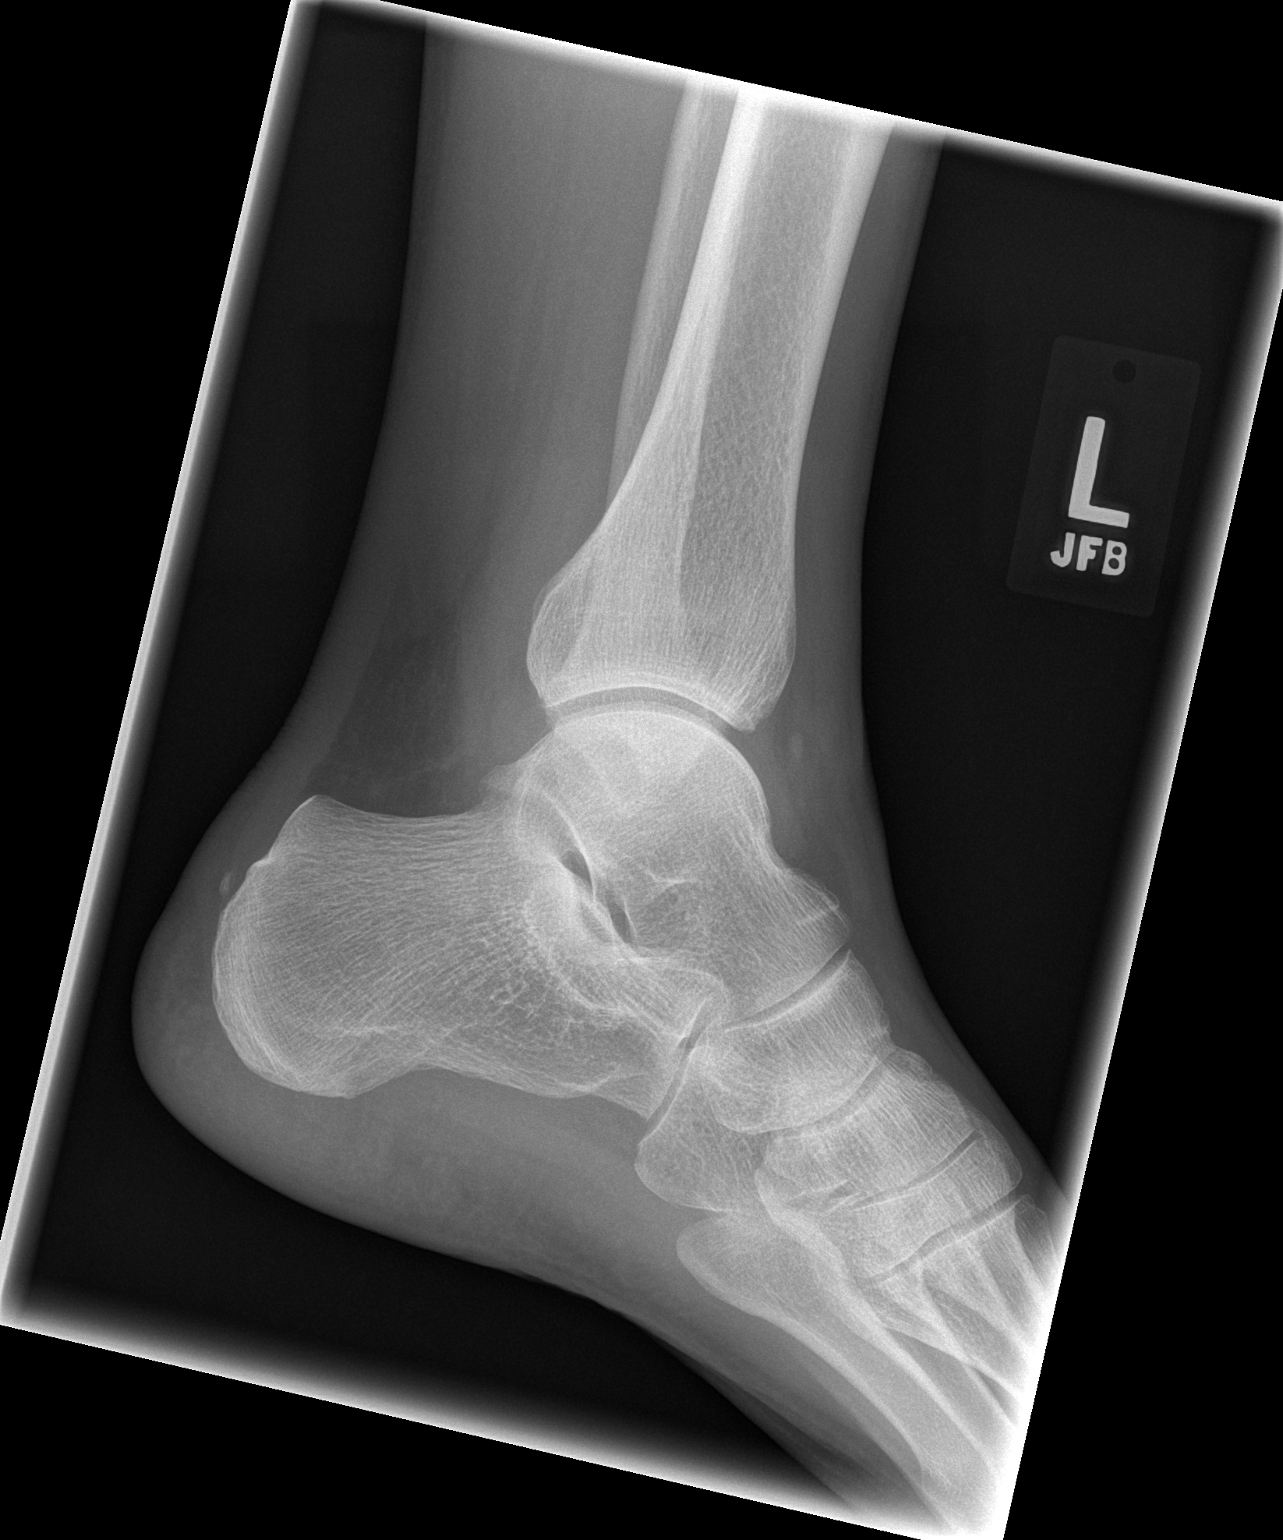

[3 of 3 positions shown; findings below may reference images not displayed]

FINDINGS: There is no evidence of fracture, dislocation, or joint effusion.
There is no evidence of arthropathy or other focal bone abnormality.
Soft tissues are unremarkable.
IMPRESSION: Negative.

## 2017-05-28 ENCOUNTER — Other Ambulatory Visit: Payer: Self-pay | Admitting: *Deleted

## 2017-05-28 MED ORDER — PANTOPRAZOLE SODIUM 40 MG PO TBEC: 40.0000 mg | DELAYED_RELEASE_TABLET | Freq: Every morning | ORAL | 5 refills | Status: DC

## 2017-07-13 ENCOUNTER — Other Ambulatory Visit: Payer: Self-pay | Admitting: Family Medicine

## 2017-07-13 MED ORDER — ALPRAZOLAM 0.5 MG PO TABS: ORAL_TABLET | ORAL | 5 refills | Status: DC

## 2017-07-13 NOTE — Telephone Encounter (Signed)
Copied from [phone]. Topic: Quick Communication - Rx Refill/Question >> Jul 13, 2017  8:11 AM Robina Ade, Helene Kelp D wrote: Has the patient contacted their pharmacy? Yes (Agent: If no, request that the patient contact the pharmacy for the refill.) Preferred Pharmacy (with phone number or street name): [place] [place] Agent: Please be advised that RX refills may take up to 3 business days. We ask that you follow-up with your pharmacy. Patient need refill on his ALPRAZolam (XANAX) 0.5 MG tablet.

## 2017-07-13 NOTE — Telephone Encounter (Signed)
Xanax refill. Last prescription on 01/05/17 with 5 refills.

## 2017-07-13 NOTE — Telephone Encounter (Signed)
RF request for alprazolam LOV: 04/19/17 Next ov: 04/22/18 Last written: 01/05/17 #120 w/ 5RF  Please advise. Thanks.

## 2017-07-13 NOTE — Telephone Encounter (Signed)
Rx faxed

## 2017-10-22 ENCOUNTER — Encounter: Payer: Self-pay | Admitting: Family Medicine

## 2017-10-22 ENCOUNTER — Ambulatory Visit: Payer: 59 | Admitting: Family Medicine

## 2017-10-22 VITALS — BP 139/90 | HR 79 | Temp 98.2°F | Resp 16 | Ht 70.0 in | Wt 181.0 lb

## 2017-10-22 DIAGNOSIS — J180 Bronchopneumonia, unspecified organism: Secondary | ICD-10-CM

## 2017-10-22 MED ORDER — AZITHROMYCIN 250 MG PO TABS: ORAL_TABLET | ORAL | 0 refills | Status: DC

## 2017-10-22 MED ORDER — PREDNISONE 20 MG PO TABS: 20.0000 mg | ORAL_TABLET | Freq: Every day | ORAL | 0 refills | Status: DC

## 2017-10-22 NOTE — Progress Notes (Signed)
OFFICE VISIT  10/22/2017   CC:  Chief Complaint  Patient presents with  . Cough    chest congestion x 2 weeks   HPI:    Patient is a 67 y.o. Caucasian male who presents for respiratory symptoms. Onset 2-3 weeks ago, nasal congestion/nasal mucous, PND, lots of coughing up green and thick mucous. No fevers.  Chest hurts from coughing.  Last week says he had chest tightness, wheezing, SOB. No body aches.  Feeling fatigued and run down.  Food intake down, fluids fine. Dimetapp has been tried.   No rash, no signif HAs, no ST.   Past Medical History:  Diagnosis Date  . Anxiety   . BPPV (benign paroxysmal positional vertigo) 06/2014  . Chronic insomnia   . Chronic lumbar pain    DDD; ibuprofen, occasional muscle relaxers.  MRI L-spine 2007 showed some bulging discs with nerve impingement.  Has never had back injection or surgery.  . Contact dermatitis and eczema   . Diverticulitis   . Genital herpes 08/2015   HSV type I on culture  . Gout    "always right big toe"--about one episode every 4 yrs.  Right ankle flare, 02/2016.  Marland Kitchen History of adenomatous polyp of colon 2005  . History of bronchitis 08/11/2012  . History of Clostridium difficile colitis 2016  . HTN (hypertension)   . Hyperlipidemia, mixed    Recommended statin 04/2017  . PUD (peptic ulcer disease)     Past Surgical History:  Procedure Laterality Date  . COLONOSCOPY  07/2013   Normal.  Recall 10 yrs.  . COLONOSCOPY W/ POLYPECTOMY  2005   Dr. Lyla Son    Outpatient Medications Prior to Visit  Medication Sig Dispense Refill  . ALPRAZolam (XANAX) 0.5 MG tablet take 1 tablet by mouth four times a day if needed for STRESS 120 tablet 5  . Ascorbic Acid (VITAMIN C) 1000 MG tablet Take 1,000 mg by mouth daily.      Marland Kitchen aspirin 81 MG tablet Take 81 mg by mouth daily.      . diclofenac (VOLTAREN) 75 MG EC tablet Take 1 tablet (75 mg total) by mouth 2 (two) times daily. 60 tablet 1  . fluticasone (FLONASE) 50 MCG/ACT nasal  spray instill 2 sprays into each nostril once daily if needed for allergies 16 g 5  . ibuprofen (ADVIL,MOTRIN) 800 MG tablet take 1 tablet by mouth three times a day with food if needed for pain 90 tablet 5  . metaxalone (SKELAXIN) 800 MG tablet 1/2-1 tab po tid prn 90 tablet 3  . Multiple Vitamin (MULTIVITAMIN) tablet Take 1 tablet by mouth daily.      . pantoprazole (PROTONIX) 40 MG tablet Take 1 tablet (40 mg total) every morning by mouth. 30 tablet 5  . saccharomyces boulardii (FLORASTOR) 250 MG capsule Take 1 capsules twice daily. 60 capsule 3  . triamcinolone cream (KENALOG) 0.1 % apply to affected area twice a day 45 g 5  . zolpidem (AMBIEN CR) 12.5 MG CR tablet take 1 tablet by mouth at bedtime if needed 30 tablet 5  . predniSONE (DELTASONE) 20 MG tablet 60 mg x1d, 40 mg x2d, 20 mg x2d, 10 mg x2d (Patient not taking: Reported on 10/22/2017) 10 tablet 0   No facility-administered medications prior to visit.     Allergies  Allergen Reactions  . Prednisone Other (See Comments)    Insomnia, elevated BP and HR  . Augmentin [Amoxicillin-Pot Clavulanate] Diarrhea    Upset stomach  ROS As per HPI  PE: Blood pressure 139/90, pulse 79, temperature 98.2 F (36.8 C), temperature source Oral, resp. rate 16, height 5\' 10"  (1.778 m), weight 181 lb (82.1 kg), SpO2 97 %. Body mass index is 25.97 kg/m.  VS: noted--normal. Gen: alert, NAD, NONTOXIC APPEARING. HEENT: eyes without injection, drainage, or swelling.  Ears: EACs clear, TMs with normal light reflex and landmarks.  Nose: Clear rhinorrhea, with some dried, crusty exudate adherent to mildly injected mucosa.  No purulent d/c.  R>L paranasal sinus TTP.  No facial swelling.  Throat and mouth without focal lesion.  No pharyngial swelling, erythema, or exudate.   Neck: supple, no LAD.   LUNGS: CTA bilat, nonlabored resps.  Occ post-exhalation cough. CV: RRR, no m/r/g. EXT: no c/c/e SKIN: no rash    LABS:    Chemistry      Component  Value Date/Time   NA 139 04/19/2017 0836   K 4.1 04/19/2017 0836   CL 104 04/19/2017 0836   CO2 25 04/19/2017 0836   BUN 11 04/19/2017 0836   CREATININE 0.99 04/19/2017 0836      Component Value Date/Time   CALCIUM 9.6 04/19/2017 0836   ALKPHOS 78 04/19/2017 0836   AST 18 04/19/2017 0836   ALT 20 04/19/2017 0836   BILITOT 1.7 (H) 04/19/2017 0836      IMPRESSION AND PLAN:  Acute bronchopneumonia: Zpack. Prednisone 40mg  qd x5d, then 20mg  qd x 5d. (This is listed as intol in allergies but pt says insomnia not bad on this, plus says most recent course of prednisone he took rx'd here helped significantly). Instructions: Take over the counter generic delsym for cough suppression. Push fluids, rest.  An After Visit Summary was printed and given to the patient.  FOLLOW UP: Return if symptoms worsen or fail to improve.  Signed:  Crissie Sickles, MD           10/22/2017

## 2017-10-22 NOTE — Patient Instructions (Signed)
Take over the counter generic delsym for cough suppression.

## 2017-10-31 ENCOUNTER — Ambulatory Visit: Payer: 59 | Admitting: Family Medicine

## 2017-10-31 ENCOUNTER — Encounter: Payer: Self-pay | Admitting: Family Medicine

## 2017-10-31 VITALS — BP 141/80 | HR 80 | Temp 98.0°F | Resp 16 | Ht 70.0 in | Wt 177.0 lb

## 2017-10-31 DIAGNOSIS — M109 Gout, unspecified: Secondary | ICD-10-CM | POA: Diagnosis not present

## 2017-10-31 DIAGNOSIS — M79671 Pain in right foot: Secondary | ICD-10-CM

## 2017-10-31 DIAGNOSIS — R05 Cough: Secondary | ICD-10-CM | POA: Diagnosis not present

## 2017-10-31 DIAGNOSIS — R059 Cough, unspecified: Secondary | ICD-10-CM

## 2017-10-31 MED ORDER — COLCHICINE 0.6 MG PO TABS: ORAL_TABLET | ORAL | 1 refills | Status: DC

## 2017-10-31 NOTE — Patient Instructions (Signed)
You have an appointment Thursday 11/01/17 at 10 AM with Dr. Barbaraann Barthel.

## 2017-10-31 NOTE — Progress Notes (Signed)
OFFICE VISIT  10/31/2017   CC:  Chief Complaint  Patient presents with  . Gout    ?   HPI:    Patient is a 67 y.o. Caucasian male who presents for right foot pain. Hx of recurrent gout flares, almost exclusively involves R big toe or ankle. Onset about 8 d/a pain in base of R 5th metacarpal--definitely an atypical place for gout, esp for him.  Constant pain, worse with wt bearing, red, swollen.  Some improvement with prednisone recently that I rx'd for his lungs (see below), and is now only a problem the first 2 hours after getting up: can't bear wt w/out excruciating pain, can't even put shoes on it.   No recent trauma to this region.  No f/c/m. Currently he says he doesn't hurt at all! He is leaving for the beach in 2d.  He states he simply cannot tolerate anymore prednisone---this med makes him very "jacked up", causes insomnia, makes him irritable and he can't think well at all.  He says he also feels "weird" when he takes his diclofenac. He has never been able to tolerate pain medication like hydrocodone either. "I just can't take meds well".   I treated him for acute bronchopneumonia when I saw him in office 10/22/17: Z pack and prednisone x 10d. Feeling much better, wants lungs rechecked.  Has a bit of a raspy cough still.  No fever, ST, SOB, wheezing, or malaise.   Past Medical History:  Diagnosis Date  . Anxiety   . BPPV (benign paroxysmal positional vertigo) 06/2014  . Chronic insomnia   . Chronic lumbar pain    DDD; ibuprofen, occasional muscle relaxers.  MRI L-spine 2007 showed some bulging discs with nerve impingement.  Has never had back injection or surgery.  . Contact dermatitis and eczema   . Diverticulitis   . Genital herpes 08/2015   HSV type I on culture  . Gout    "always right big toe"--about one episode every 4 yrs.  Right ankle flare, 02/2016.  Marland Kitchen History of adenomatous polyp of colon 2005  . History of bronchitis 08/11/2012  . History of Clostridium  difficile colitis 2016  . HTN (hypertension)   . Hyperlipidemia, mixed    Recommended statin 04/2017  . PUD (peptic ulcer disease)     Past Surgical History:  Procedure Laterality Date  . COLONOSCOPY  07/2013   Normal.  Recall 10 yrs.  . COLONOSCOPY W/ POLYPECTOMY  2005   Dr. Lyla Son    Outpatient Medications Prior to Visit  Medication Sig Dispense Refill  . ALPRAZolam (XANAX) 0.5 MG tablet take 1 tablet by mouth four times a day if needed for STRESS 120 tablet 5  . Ascorbic Acid (VITAMIN C) 1000 MG tablet Take 1,000 mg by mouth daily.      Marland Kitchen aspirin 81 MG tablet Take 81 mg by mouth daily.      . fluticasone (FLONASE) 50 MCG/ACT nasal spray instill 2 sprays into each nostril once daily if needed for allergies 16 g 5  . ibuprofen (ADVIL,MOTRIN) 800 MG tablet take 1 tablet by mouth three times a day with food if needed for pain 90 tablet 5  . metaxalone (SKELAXIN) 800 MG tablet 1/2-1 tab po tid prn 90 tablet 3  . Multiple Vitamin (MULTIVITAMIN) tablet Take 1 tablet by mouth daily.      . pantoprazole (PROTONIX) 40 MG tablet Take 1 tablet (40 mg total) every morning by mouth. 30 tablet 5  .  saccharomyces boulardii (FLORASTOR) 250 MG capsule Take 1 capsules twice daily. 60 capsule 3  . triamcinolone cream (KENALOG) 0.1 % apply to affected area twice a day 45 g 5  . zolpidem (AMBIEN CR) 12.5 MG CR tablet take 1 tablet by mouth at bedtime if needed 30 tablet 5  . diclofenac (VOLTAREN) 75 MG EC tablet Take 1 tablet (75 mg total) by mouth 2 (two) times daily. 60 tablet 1  . azithromycin (ZITHROMAX) 250 MG tablet 2 tabs po qd x 1d, then 1 tab po qd x 4d (Patient not taking: Reported on 10/31/2017) 6 tablet 0  . predniSONE (DELTASONE) 20 MG tablet Take 1 tablet (20 mg total) by mouth daily with breakfast. (Patient not taking: Reported on 10/31/2017) 15 tablet 0   No facility-administered medications prior to visit.     Allergies  Allergen Reactions  . Prednisone Other (See Comments)     Insomnia, elevated BP and HR  . Augmentin [Amoxicillin-Pot Clavulanate] Diarrhea    Upset stomach    ROS As per HPI  PE: Blood pressure (!) 141/80, pulse 80, temperature 98 F (36.7 C), temperature source Oral, resp. rate 16, height 5\' 10"  (1.778 m), weight 177 lb (80.3 kg), SpO2 98 %. Gen: Alert, well appearing.  Patient is oriented to person, place, time, and situation. BOF:BPZW: no injection, icteris, swelling, or exudate.  EOMI, PERRLA. Mouth: lips without lesion/swelling.  Oral mucosa pink and moist. Oropharynx without erythema, exudate, or swelling.  CV: RRR, no m/r/g.   LUNGS: CTA bilat, nonlabored resps, good aeration in all lung fields. EXT: no clubbing, cyanosis, or edema.  Right ankle: no erythema, swelling, or warmth.  Normal ROM. Foot has no swelling.  There is focal erythema and warmth of skin overlying the base of 5th metatarsal bone (cuboid-metacarpal jt?) in anterolateral aspect.  Mild TTP over this region.  The erythema is blanching to pressure.  NO streaking. No abnormality of any other aspect of the foot.  LABS:    Chemistry      Component Value Date/Time   NA 139 04/19/2017 0836   K 4.1 04/19/2017 0836   CL 104 04/19/2017 0836   CO2 25 04/19/2017 0836   BUN 11 04/19/2017 0836   CREATININE 0.99 04/19/2017 0836      Component Value Date/Time   CALCIUM 9.6 04/19/2017 0836   ALKPHOS 78 04/19/2017 0836   AST 18 04/19/2017 0836   ALT 20 04/19/2017 0836   BILITOT 1.7 (H) 04/19/2017 0836     Lab Results  Component Value Date   LABURIC 6.0 11/04/2014    IMPRESSION AND PLAN:  1) Right foot pain, suspect gouty arthritis: atypical location. Pt does not tolerate prednisone or his NSAID (diclofenac). This is not a joint that I have the expertise to aspirate or inject. He is interested in possibly seeing a specialist who may be able to give a steroid injection into the affected joint. He has seen Dr. Barbaraann Barthel, sports med MD, in the past.  Dr. Barbaraann Barthel was kind  enough put pt on his schedule for 10 AM tomorrow. We discuss trial of colchicine: gave rx with instructions for prn flare use.  Given his difficulty tolerating meds, our conversations about gout prophylaxis have not gone very far.  If he tolerates prn use of colchicine, then we can get him on this med bid scheduled for our best option for prophylaxis for him.    2) Acute bronchopneumonia--resolving appropriately.  Spent 25 min with pt today, with >50% of  this time spent in counseling and care coordination regarding the above problems.  An After Visit Summary was printed and given to the patient.  FOLLOW UP: Return for as needed.  Signed:  Crissie Sickles, MD           10/31/2017

## 2017-11-01 ENCOUNTER — Encounter: Payer: Self-pay | Admitting: Family Medicine

## 2017-11-01 ENCOUNTER — Ambulatory Visit: Payer: 59 | Admitting: Family Medicine

## 2017-11-01 DIAGNOSIS — M79671 Pain in right foot: Secondary | ICD-10-CM

## 2017-11-01 NOTE — Patient Instructions (Addendum)
You have an acute gout flare. Go ahead and fill the colchicine - take as directed. Take the diclofenac (voltaren) twice a day with food for at least 2 more days then as needed. If you're still struggling We could injection your TMT joint with ultrasound guidance - the issue is this takes at least 2-3 days to kick in, is more sore in the interim while you're waiting. Icing 15 minutes at a time 3-4 times a day. Elevate above your heart when possible for the swelling. Have a great time at the beach!

## 2017-11-02 ENCOUNTER — Encounter: Payer: Self-pay | Admitting: Family Medicine

## 2017-11-02 NOTE — Progress Notes (Signed)
PCP and consultation requested by: Keith Sou, MD  Subjective:   HPI: Patient is a 67 y.o. male here for right foot pain.  Patient reports about 1 week ago he was diagnosed with bronchitis and pneumonia. He started taking a low dose prednisone at that time. Saturday he reports noticing pain dorsolateral right foot with some redness and warmth. Was very difficulty to put any pressure on his right foot. 2 days ago the area could not be touched due to pain. He reports as he's tapered down his prednisone (5mg  yesterday) he's taken voltaren twice a day. His PCP prescribed colchicine as well but there was a hold up at the pharmacy due to insurance (today he called while in the office and it's been sorted out). His pain is much improved today at 4/10 level, easier to walk. No fevers, chills, sweats. Referred to [place] to consider injection.  Past Medical History:  Diagnosis Date  . Anxiety   . BPPV (benign paroxysmal positional vertigo) 06/2014  . Chronic insomnia   . Chronic lumbar pain    DDD; ibuprofen, occasional muscle relaxers.  MRI L-spine 2007 showed some bulging discs with nerve impingement.  Has never had back injection or surgery.  . Contact dermatitis and eczema   . Diverticulitis   . Genital herpes 08/2015   HSV type I on culture  . Gout    "always right big toe"--about one episode every 4 yrs.  Right ankle flare, 02/2016.  Marland Kitchen History of adenomatous polyp of colon 2005  . History of bronchitis 08/11/2012  . History of Clostridium difficile colitis 2016  . HTN (hypertension)   . Hyperlipidemia, mixed    Recommended statin 04/2017  . PUD (peptic ulcer disease)     Current Outpatient Medications on File Prior to Visit  Medication Sig Dispense Refill  . ALPRAZolam (XANAX) 0.5 MG tablet take 1 tablet by mouth four times a day if needed for STRESS 120 tablet 5  . Ascorbic Acid (VITAMIN C) 1000 MG tablet Take 1,000 mg by mouth daily.      Marland Kitchen aspirin 81 MG tablet Take 81 mg by  mouth daily.      . colchicine 0.6 MG tablet 1 tab po at onset of gout pain, then 1 tab po 1 hour later. Then, starting the following day take 1 tab po bid until flare has resolved. 30 tablet 1  . fluticasone (FLONASE) 50 MCG/ACT nasal spray instill 2 sprays into each nostril once daily if needed for allergies 16 g 5  . ibuprofen (ADVIL,MOTRIN) 800 MG tablet take 1 tablet by mouth three times a day with food if needed for pain 90 tablet 5  . metaxalone (SKELAXIN) 800 MG tablet 1/2-1 tab po tid prn 90 tablet 3  . Multiple Vitamin (MULTIVITAMIN) tablet Take 1 tablet by mouth daily.      . pantoprazole (PROTONIX) 40 MG tablet Take 1 tablet (40 mg total) every morning by mouth. 30 tablet 5  . saccharomyces boulardii (FLORASTOR) 250 MG capsule Take 1 capsules twice daily. 60 capsule 3  . triamcinolone cream (KENALOG) 0.1 % apply to affected area twice a day 45 g 5  . zolpidem (AMBIEN CR) 12.5 MG CR tablet take 1 tablet by mouth at bedtime if needed 30 tablet 5   No current facility-administered medications on file prior to visit.     Past Surgical History:  Procedure Laterality Date  . COLONOSCOPY  07/2013   Normal.  Recall 10 yrs.  . COLONOSCOPY  W/ POLYPECTOMY  2005   Dr. Lyla Son    Allergies  Allergen Reactions  . Prednisone Other (See Comments)    Insomnia, elevated BP and HR  . Augmentin [Amoxicillin-Pot Clavulanate] Diarrhea    Upset stomach    Social History   Socioeconomic History  . Marital status: Married    Spouse name: Not on file  . Number of children: 3  . Years of education: Not on file  . Highest education level: Not on file  Occupational History  . Occupation: Geographical information systems officer  . Financial resource strain: Not on file  . Food insecurity:    Worry: Not on file    Inability: Not on file  . Transportation needs:    Medical: Not on file    Non-medical: Not on file  Tobacco Use  . Smoking status: Never Smoker  . Smokeless tobacco: Never Used  Substance  and Sexual Activity  . Alcohol use: Yes    Alcohol/week: 8.4 oz    Types: 14 Cans of beer per week  . Drug use: No  . Sexual activity: Yes  Lifestyle  . Physical activity:    Days per week: Not on file    Minutes per session: Not on file  . Stress: Not on file  Relationships  . Social connections:    Talks on phone: Not on file    Gets together: Not on file    Attends religious service: Not on file    Active member of club or organization: Not on file    Attends meetings of clubs or organizations: Not on file    Relationship status: Not on file  . Intimate partner violence:    Fear of current or ex partner: Not on file    Emotionally abused: Not on file    Physically abused: Not on file    Forced sexual activity: Not on file  Other Topics Concern  . Not on file  Social History Narrative   Married, 3 grown children.   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   Lived in [place] area all his life.   No T/A/Ds.   Walks 3-4 times per day.    Family History  Problem Relation Age of Onset  . Heart attack Father 5  . Heart disease Father        MI age 13  . Heart disease Other   . Colon polyps Mother   . Ovarian cancer Sister   . Colon cancer Neg Hx     BP (!) 143/91   Pulse 80   Ht 5\' 11"  (1.803 m)   Wt 178 lb (80.7 kg)   BMI 24.83 kg/m   Review of Systems: See HPI above.     Objective:  Physical Exam:  Gen: NAD, comfortable in exam room  Right foot/ankle: 1+ pitting edema dorsal foot laterally.  About 1cm circumferential area of redness but no warmth currently.  No bruising, other deformity. FROM ankle and digits with 5/5 strength and no pain. TTP mildly TMT joint laterally. Negative ant drawer and talar tilt.   Negative syndesmotic compression. Thompsons test negative. NV intact distally.   MSK u/s: Soft tissue edema confirmed. No cortical irregularities of 3rd-5th metatarsals or cuboid.  Mild effusion lateral TMT joint.  Assessment & Plan:  1. Right foot  pain - 2/2 acute gout flare.  He has noticed improvement over the past 24-48 hours finishing up his prednisone and taking diclofenac.  He has not yet started colchicine -  got issue sorted out with insurance so he can fill this and start taking.  We discussed ultrasound-guided cortisone injection into TMT joint - given he is improving and this would take 2-3 days to give him some relief opted to use combination of oral medications.  He returns from the beach on Monday - advised if still having problems to call me at that time.  Icing, elevation as well.

## 2017-11-03 DIAGNOSIS — M79671 Pain in right foot: Secondary | ICD-10-CM | POA: Insufficient documentation

## 2017-11-03 NOTE — Assessment & Plan Note (Signed)
2/2 acute gout flare.  He has noticed improvement over the past 24-48 hours finishing up his prednisone and taking diclofenac.  He has not yet started colchicine - got issue sorted out with insurance so he can fill this and start taking.  We discussed ultrasound-guided cortisone injection into TMT joint - given he is improving and this would take 2-3 days to give him some relief opted to use combination of oral medications.  He returns from the beach on Monday - advised if still having problems to call me at that time.  Icing, elevation as well.

## 2017-11-11 ENCOUNTER — Other Ambulatory Visit: Payer: Self-pay | Admitting: Family Medicine

## 2017-11-12 NOTE — Telephone Encounter (Signed)
RF request for zolpidem LOV: 04/19/17 Next ov: 04/22/18 Last written: 05/14/17 #30 w/ 5RF  Please advise. Thanks.

## 2017-11-12 NOTE — Telephone Encounter (Signed)
Rx faxed to pharmacy  

## 2017-12-03 ENCOUNTER — Other Ambulatory Visit: Payer: Self-pay | Admitting: *Deleted

## 2017-12-03 MED ORDER — IBUPROFEN 800 MG PO TABS: ORAL_TABLET | ORAL | 5 refills | Status: DC

## 2017-12-03 NOTE — Telephone Encounter (Signed)
RF request for ibuprofen LOV: 10/31/17 Next ov: 04/22/18 Last written: 08/16/16 #90 w/ 5RF  Please advise. Thanks.

## 2017-12-05 ENCOUNTER — Other Ambulatory Visit: Payer: Self-pay | Admitting: Family Medicine

## 2017-12-14 ENCOUNTER — Ambulatory Visit: Payer: 59 | Admitting: Family Medicine

## 2017-12-14 ENCOUNTER — Encounter: Payer: Self-pay | Admitting: Family Medicine

## 2017-12-14 VITALS — BP 129/78 | HR 78 | Temp 98.1°F | Resp 16 | Ht 71.0 in | Wt 183.4 lb

## 2017-12-14 DIAGNOSIS — W57XXXA Bitten or stung by nonvenomous insect and other nonvenomous arthropods, initial encounter: Secondary | ICD-10-CM

## 2017-12-14 DIAGNOSIS — S90561A Insect bite (nonvenomous), right ankle, initial encounter: Secondary | ICD-10-CM | POA: Diagnosis not present

## 2017-12-14 MED ORDER — FLUTICASONE PROPIONATE 0.05 % EX CREA: TOPICAL_CREAM | Freq: Two times a day (BID) | CUTANEOUS | 0 refills | Status: DC

## 2017-12-14 NOTE — Progress Notes (Signed)
OFFICE VISIT  12/14/2017   CC:  Chief Complaint  Patient presents with  . Insect Bite    right lower leg/ankle   HPI:    Patient is a 67 y.o. Caucasian male who presents for insect bites on right ankle. Got bitten on R ankle lateral aspect by an unknown insect a few times a couple days ago.  Looked like large mosquito bites but he did not see the insect. Itchy a bit.  Noted more redness to the area yesterday. No pain.  A bit swollen feeling at the site of the bites.  Nothing applied except some alcohol.  Denies f/c, myalgias, arthralgias, HA's, rash, or fatigue/malaise.  Past Medical History:  Diagnosis Date  . Anxiety   . BPPV (benign paroxysmal positional vertigo) 06/2014  . Chronic insomnia   . Chronic lumbar pain    DDD; ibuprofen, occasional muscle relaxers.  MRI L-spine 2007 showed some bulging discs with nerve impingement.  Has never had back injection or surgery.  . Contact dermatitis and eczema   . Diverticulitis   . Genital herpes 08/2015   HSV type I on culture  . Gout    "always right big toe"--about one episode every 4 yrs.  Right ankle flare, 02/2016.  Marland Kitchen History of adenomatous polyp of colon 2005  . History of bronchitis 08/11/2012  . History of Clostridium difficile colitis 2016  . HTN (hypertension)   . Hyperlipidemia, mixed    Recommended statin 04/2017  . PUD (peptic ulcer disease)     Past Surgical History:  Procedure Laterality Date  . COLONOSCOPY  07/2013   Normal.  Recall 10 yrs.  . COLONOSCOPY W/ POLYPECTOMY  2005   Dr. Lyla Son    Outpatient Medications Prior to Visit  Medication Sig Dispense Refill  . ALPRAZolam (XANAX) 0.5 MG tablet take 1 tablet by mouth four times a day if needed for STRESS 120 tablet 5  . Ascorbic Acid (VITAMIN C) 1000 MG tablet Take 1,000 mg by mouth daily.      Marland Kitchen aspirin 81 MG tablet Take 81 mg by mouth daily.      . colchicine 0.6 MG tablet 1 tab po at onset of gout pain, then 1 tab po 1 hour later. Then, starting  the following day take 1 tab po bid until flare has resolved. 30 tablet 1  . fluticasone (FLONASE) 50 MCG/ACT nasal spray instill 2 sprays into each nostril once daily if needed for allergies 16 g 5  . ibuprofen (ADVIL,MOTRIN) 800 MG tablet take 1 tablet by mouth three times a day with food if needed for pain 90 tablet 5  . Multiple Vitamin (MULTIVITAMIN) tablet Take 1 tablet by mouth daily.      . pantoprazole (PROTONIX) 40 MG tablet TAKE 1 TABLET BY MOUTH EVERY MORNING 30 tablet 5  . saccharomyces boulardii (FLORASTOR) 250 MG capsule Take 1 capsules twice daily. 60 capsule 3  . zolpidem (AMBIEN CR) 12.5 MG CR tablet TAKE 1 TABLET BY MOUTH AT BEDTIME IF NEEDED 30 tablet 5  . metaxalone (SKELAXIN) 800 MG tablet 1/2-1 tab po tid prn (Patient not taking: Reported on 12/14/2017) 90 tablet 3  . triamcinolone cream (KENALOG) 0.1 % apply to affected area twice a day (Patient not taking: Reported on 12/14/2017) 45 g 5   No facility-administered medications prior to visit.     Allergies  Allergen Reactions  . Prednisone Other (See Comments)    Insomnia, elevated BP and HR  . Augmentin [Amoxicillin-Pot Clavulanate]  Diarrhea    Upset stomach    ROS As per HPI  PE: Blood pressure 129/78, pulse 78, temperature 98.1 F (36.7 C), temperature source Oral, resp. rate 16, height 5\' 11"  (1.803 m), weight 183 lb 6 oz (83.2 kg), SpO2 97 %. Gen: Alert, well appearing.  Patient is oriented to person, place, time, and situation. AFFECT: pleasant, lucid thought and speech. Right ankle, lateral aspect with a couple of 1-2 cm splotches of violaceous-colored macules, with a bit of induration and cobblestone texture to the surface.  These macules do NOT blanch.  No tenderness.  No streaking.  NO subQ nodularity or fluctuance.  No ulceration, vesicles, or pustules.   LABS:    Chemistry      Component Value Date/Time   NA 139 04/19/2017 0836   K 4.1 04/19/2017 0836   CL 104 04/19/2017 0836   CO2 25 04/19/2017  0836   BUN 11 04/19/2017 0836   CREATININE 0.99 04/19/2017 0836      Component Value Date/Time   CALCIUM 9.6 04/19/2017 0836   ALKPHOS 78 04/19/2017 0836   AST 18 04/19/2017 0836   ALT 20 04/19/2017 0836   BILITOT 1.7 (H) 04/19/2017 0836     Lab Results  Component Value Date   LABURIC 6.0 11/04/2014    IMPRESSION AND PLAN:  Insect bites, signif inflamm response with some ecchymoses. Cutivate 0.05% cream, apply bid. Signs/symptoms to call or return for were reviewed and pt expressed understanding.  An After Visit Summary was printed and given to the patient.  FOLLOW UP: Return if symptoms worsen or fail to improve.  Signed:  Crissie Sickles, MD           12/14/2017

## 2018-01-10 ENCOUNTER — Other Ambulatory Visit: Payer: Self-pay | Admitting: Family Medicine

## 2018-01-10 NOTE — Telephone Encounter (Signed)
RF request for alprazolam LOV: 04/19/17 Next ov: 04/22/18 Last written: 07/13/17 #120 w/ 5RF  Please advise. Thanks.

## 2018-01-10 NOTE — Telephone Encounter (Signed)
Pt. Called about xanax prescription,  [facility] said his refills were expired and had to have a new prescription.  He is leaving tomorrow for 8 days vacation and will need medication for trip.   [facility] has sent in request several days ago

## 2018-01-10 NOTE — Telephone Encounter (Signed)
Rx eRxed by Dr. Anitra Lauth.  Pt advised and voiced understanding.

## 2018-02-18 ENCOUNTER — Other Ambulatory Visit: Payer: Self-pay | Admitting: Family Medicine

## 2018-02-18 NOTE — Telephone Encounter (Signed)
RF request for alprazolam LOV: 04/19/17 Next ov: 04/22/18 Last written: 01/10/18 #120 w/ 0RF  Please advise. Thanks.

## 2018-04-20 ENCOUNTER — Other Ambulatory Visit: Payer: Self-pay | Admitting: Family Medicine

## 2018-04-22 ENCOUNTER — Encounter: Payer: Self-pay | Admitting: *Deleted

## 2018-04-22 ENCOUNTER — Encounter: Payer: Self-pay | Admitting: Family Medicine

## 2018-04-22 ENCOUNTER — Ambulatory Visit (INDEPENDENT_AMBULATORY_CARE_PROVIDER_SITE_OTHER): Payer: Medicare Other | Admitting: Family Medicine

## 2018-04-22 VITALS — BP 139/89 | HR 75 | Temp 98.2°F | Resp 16 | Ht 70.0 in | Wt 178.1 lb

## 2018-04-22 DIAGNOSIS — Z0000 Encounter for general adult medical examination without abnormal findings: Secondary | ICD-10-CM | POA: Diagnosis not present

## 2018-04-22 DIAGNOSIS — E7800 Pure hypercholesterolemia, unspecified: Secondary | ICD-10-CM | POA: Diagnosis not present

## 2018-04-22 DIAGNOSIS — M1000 Idiopathic gout, unspecified site: Secondary | ICD-10-CM

## 2018-04-22 DIAGNOSIS — I10 Essential (primary) hypertension: Secondary | ICD-10-CM | POA: Diagnosis not present

## 2018-04-22 DIAGNOSIS — E663 Overweight: Secondary | ICD-10-CM

## 2018-04-22 DIAGNOSIS — Z23 Encounter for immunization: Secondary | ICD-10-CM | POA: Diagnosis not present

## 2018-04-22 DIAGNOSIS — Z125 Encounter for screening for malignant neoplasm of prostate: Secondary | ICD-10-CM | POA: Diagnosis not present

## 2018-04-22 LAB — COMPREHENSIVE METABOLIC PANEL
ALT: 23 U/L (ref 0–53)
AST: 20 U/L (ref 0–37)
Albumin: 4.5 g/dL (ref 3.5–5.2)
Alkaline Phosphatase: 80 U/L (ref 39–117)
BUN: 15 mg/dL (ref 6–23)
CO2: 26 mEq/L (ref 19–32)
Calcium: 9.3 mg/dL (ref 8.4–10.5)
Chloride: 106 mEq/L (ref 96–112)
Creatinine, Ser: 0.93 mg/dL (ref 0.40–1.50)
GFR: 86.64 mL/min (ref >60.00)
Glucose, Bld: 94 mg/dL (ref 70–99)
Potassium: 4.3 mEq/L (ref 3.5–5.1)
Sodium: 139 mEq/L (ref 135–145)
Total Bilirubin: 1.4 mg/dL — ABNORMAL HIGH (ref 0.2–1.2)
Total Protein: 6.5 g/dL (ref 6.0–8.3)

## 2018-04-22 LAB — CBC WITH DIFFERENTIAL/PLATELET
Basophils Absolute: 0 10*3/uL (ref 0.0–0.1)
Basophils Relative: 0.4 % (ref 0.0–3.0)
Eosinophils Absolute: 1.8 10*3/uL — ABNORMAL HIGH (ref 0.0–0.7)
Eosinophils Relative: 19.4 % — ABNORMAL HIGH (ref 0.0–5.0)
HCT: 43.8 % (ref 39.0–52.0)
Hemoglobin: 15.7 g/dL (ref 13.0–17.0)
Lymphocytes Relative: 21 % (ref 12.0–46.0)
Lymphs Abs: 2 10*3/uL (ref 0.7–4.0)
MCHC: 36 g/dL (ref 30.0–36.0)
MCV: 97.2 fl (ref 78.0–100.0)
Monocytes Absolute: 0.6 10*3/uL (ref 0.1–1.0)
Monocytes Relative: 6.4 % (ref 3.0–12.0)
Neutro Abs: 4.9 10*3/uL (ref 1.4–7.7)
Neutrophils Relative %: 52.8 % (ref 43.0–77.0)
Platelets: 234 10*3/uL (ref 150.0–400.0)
RBC: 4.51 Mil/uL (ref 4.22–5.81)
RDW: 12.7 % (ref 11.5–15.5)
WBC: 9.3 10*3/uL (ref 4.0–10.5)

## 2018-04-22 LAB — URIC ACID: Uric Acid, Serum: 7.7 mg/dL (ref 4.0–7.8)

## 2018-04-22 LAB — LIPID PANEL
Cholesterol: 209 mg/dL — ABNORMAL HIGH (ref 0–200)
HDL: 44.9 mg/dL (ref >39.00)
NonHDL: 164.43
Total CHOL/HDL Ratio: 5
Triglycerides: 387 mg/dL — ABNORMAL HIGH (ref 0.0–149.0)
VLDL: 77.4 mg/dL — ABNORMAL HIGH (ref 0.0–40.0)

## 2018-04-22 LAB — LDL CHOLESTEROL, DIRECT: Direct LDL: 111 mg/dL

## 2018-04-22 LAB — PSA, MEDICARE: PSA: 0.56 ng/ml (ref 0.10–4.00)

## 2018-04-22 LAB — TSH: TSH: 3.74 u[IU]/mL (ref 0.35–4.50)

## 2018-04-22 MED ORDER — ALPRAZOLAM 0.5 MG PO TABS: ORAL_TABLET | ORAL | 5 refills | Status: DC

## 2018-04-22 NOTE — Progress Notes (Signed)
Office Note 04/22/2018  CC:  Chief Complaint  Patient presents with  . Follow-up    RCI, pt is fasting.    HPI:  Keith Sanders is a 67 y.o. White male who is here for f/u HTN, HLD, gout.  HTN: no monitoring at home lately but has bp machine. Walks 2-5 miles per day. Avoids greasy/fried food.    No flare ups of gout lately at all.  He takes colchicine on prn basis for this.  ROS: no CP, no SOB, no wheezing, no cough, no dizziness, no HAs, no rashes, no melena/hematochezia.  No polyuria or polydipsia.  No myalgias or arthralgias.  Past Medical History:  Diagnosis Date  . Anxiety   . BPPV (benign paroxysmal positional vertigo) 06/2014  . Chronic insomnia   . Chronic lumbar pain    DDD; ibuprofen, occasional muscle relaxers.  MRI L-spine 2007 showed some bulging discs with nerve impingement.  Has never had back injection or surgery.  . Contact dermatitis and eczema   . Diverticulitis   . Genital herpes 08/2015   HSV type I on culture  . Gout    "always right big toe"--about one episode every 4 yrs.  Right ankle flare, 02/2016.  Marland Kitchen History of adenomatous polyp of colon 2005  . History of bronchitis 08/11/2012  . History of Clostridium difficile colitis 2016  . HTN (hypertension)   . Hyperlipidemia, mixed    Recommended statin 04/2017  . PUD (peptic ulcer disease)     Past Surgical History:  Procedure Laterality Date  . COLONOSCOPY  07/2013   Normal.  Recall 10 yrs.  . COLONOSCOPY W/ POLYPECTOMY  2005   Dr. Lyla Son    Family History  Problem Relation Age of Onset  . Heart attack Father 89  . Heart disease Father        MI age 34  . Heart disease Other   . Colon polyps Mother   . Ovarian cancer Sister   . Colon cancer Neg Hx     Social History   Socioeconomic History  . Marital status: Married    Spouse name: Not on file  . Number of children: 3  . Years of education: Not on file  . Highest education level: Not on file  Occupational History  .  Occupation: Geographical information systems officer  . Financial resource strain: Not on file  . Food insecurity:    Worry: Not on file    Inability: Not on file  . Transportation needs:    Medical: Not on file    Non-medical: Not on file  Tobacco Use  . Smoking status: Never Smoker  . Smokeless tobacco: Never Used  Substance and Sexual Activity  . Alcohol use: Yes    Alcohol/week: 14.0 standard drinks    Types: 14 Cans of beer per week  . Drug use: No  . Sexual activity: Yes  Lifestyle  . Physical activity:    Days per week: Not on file    Minutes per session: Not on file  . Stress: Not on file  Relationships  . Social connections:    Talks on phone: Not on file    Gets together: Not on file    Attends religious service: Not on file    Active member of club or organization: Not on file    Attends meetings of clubs or organizations: Not on file    Relationship status: Not on file  . Intimate partner violence:    Fear  of current or ex partner: Not on file    Emotionally abused: Not on file    Physically abused: Not on file    Forced sexual activity: Not on file  Other Topics Concern  . Not on file  Social History Narrative   Married, 3 grown children.   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   Lived in [place] area all his life.   No T/A/Ds.   Walks 3-4 times per day.    Outpatient Medications Prior to Visit  Medication Sig Dispense Refill  . Ascorbic Acid (VITAMIN C) 1000 MG tablet Take 1,000 mg by mouth daily.      Marland Kitchen aspirin 81 MG tablet Take 81 mg by mouth daily.      . colchicine 0.6 MG tablet 1 tab po at onset of gout pain, then 1 tab po 1 hour later. Then, starting the following day take 1 tab po bid until flare has resolved. 30 tablet 1  . fluticasone (CUTIVATE) 0.05 % cream Apply topically 2 (two) times daily. 30 g 0  . fluticasone (FLONASE) 50 MCG/ACT nasal spray instill 2 sprays into each nostril once daily if needed for allergies 16 g 5  . ibuprofen (ADVIL,MOTRIN) 800 MG tablet  take 1 tablet by mouth three times a day with food if needed for pain 90 tablet 5  . Multiple Vitamin (MULTIVITAMIN) tablet Take 1 tablet by mouth daily.      . pantoprazole (PROTONIX) 40 MG tablet TAKE 1 TABLET BY MOUTH EVERY MORNING 30 tablet 5  . saccharomyces boulardii (FLORASTOR) 250 MG capsule Take 1 capsules twice daily. 60 capsule 3  . zolpidem (AMBIEN CR) 12.5 MG CR tablet TAKE 1 TABLET BY MOUTH AT BEDTIME IF NEEDED 30 tablet 5  . ALPRAZolam (XANAX) 0.5 MG tablet TAKE 1 TABLET BY MOUTH FOUR TIMES DAILY AS NEEDED FOR STRESS 120 tablet 1   No facility-administered medications prior to visit.     Allergies  Allergen Reactions  . Prednisone Other (See Comments)    Insomnia, elevated BP and HR  . Augmentin [Amoxicillin-Pot Clavulanate] Diarrhea    Upset stomach    ROS Review of Systems  Constitutional: Negative for appetite change, chills, fatigue and fever.  HENT: Negative for congestion, dental problem, ear pain and sore throat.   Eyes: Negative for discharge, redness and visual disturbance.  Respiratory: Negative for cough, chest tightness, shortness of breath and wheezing.   Cardiovascular: Negative for chest pain, palpitations and leg swelling.  Gastrointestinal: Negative for abdominal pain, blood in stool, diarrhea, nausea and vomiting.  Genitourinary: Negative for difficulty urinating, dysuria, flank pain, frequency, hematuria and urgency.  Musculoskeletal: Negative for arthralgias, back pain, joint swelling, myalgias and neck stiffness.  Skin: Negative for pallor and rash.  Neurological: Negative for dizziness, speech difficulty, weakness and headaches.  Hematological: Negative for adenopathy. Does not bruise/bleed easily.  Psychiatric/Behavioral: Negative for confusion and sleep disturbance. The patient is not nervous/anxious.     PE; Blood pressure 139/89, pulse 75, temperature 98.2 F (36.8 C), temperature source Oral, resp. rate 16, height 5\' 10"  (1.778 m), weight  178 lb 2 oz (80.8 kg), SpO2 96 %. Body mass index is 25.56 kg/m.  Gen: Alert, well appearing.  Patient is oriented to person, place, time, and situation. AFFECT: pleasant, lucid thought and speech. Neck: supple/nontender.  No LAD, mass, or TM.  Carotid pulses 2+ bilaterally, without bruits. CV: RRR, no m/r/g.   LUNGS: CTA bilat, nonlabored resps, good aeration in all lung fields. ABD:  soft, NT, ND, BS normal.  No hepatospenomegaly or mass.  No bruits. EXT: no clubbing, cyanosis, or edema.  Musculoskeletal: no joint swelling, erythema, warmth, or tenderness.  ROM of all joints intact. Skin - no sores or suspicious lesions or rashes or color changes Rectal exam: negative without mass, lesions or tenderness, PROSTATE EXAM: smooth and symmetric without nodules or tenderness.   Pertinent labs:  Lab Results  Component Value Date   TSH 4.46 04/19/2017   Lab Results  Component Value Date   WBC 6.9 04/19/2017   HGB 15.9 04/19/2017   HCT 45.5 04/19/2017   MCV 99.3 04/19/2017   PLT 265.0 04/19/2017   Lab Results  Component Value Date   CREATININE 0.99 04/19/2017   BUN 11 04/19/2017   NA 139 04/19/2017   K 4.1 04/19/2017   CL 104 04/19/2017   CO2 25 04/19/2017   Lab Results  Component Value Date   ALT 20 04/19/2017   AST 18 04/19/2017   ALKPHOS 78 04/19/2017   BILITOT 1.7 (H) 04/19/2017   Lab Results  Component Value Date   CHOL 233 (H) 04/19/2017   Lab Results  Component Value Date   HDL 46.50 04/19/2017   No results found for: [facility] Lab Results  Component Value Date   TRIG 326.0 (H) 04/19/2017   Lab Results  Component Value Date   CHOLHDL 5 04/19/2017   Lab Results  Component Value Date   PSA 0.41 04/19/2017   PSA 0.47 03/30/2016   PSA 0.38 03/15/2015   Lab Results  Component Value Date   LABURIC 6.0 11/04/2014    ASSESSMENT AND PLAN:   1) HTN: The current medical regimen is effective;  continue present plan and medications. Lytes/cr today.  2)  HLD: atorva caused leg aching in the past. He is willing to try another statin if chol returns elevated again.  3) Gout: stable.  He prefers to continue with prn colchicine approach to treatment.  4) Preventative health: Vaccines:  Pneumovax 23-->done today.     Flu vaccine--> pt declined today. Labs: fasting HP + psa + Uric acid. Prostate ca screening: DRE normal today , PSA. Colon ca screening: next colonoscopy due 2025.  5) Anxiety: doing well on scheduled alprazolam. Controlled substance contract reviewed with patient today.  Patient signed this and it will be placed in the chart.     An After Visit Summary was printed and given to the patient.  FOLLOW UP:  Return in about 3 months (around 07/23/2018) for Welcome to medicare visit.  Signed:  Crissie Sickles, MD           04/22/2018

## 2018-04-23 ENCOUNTER — Other Ambulatory Visit: Payer: Self-pay | Admitting: *Deleted

## 2018-04-23 ENCOUNTER — Encounter: Payer: Self-pay | Admitting: Family Medicine

## 2018-04-23 DIAGNOSIS — E7800 Pure hypercholesterolemia, unspecified: Secondary | ICD-10-CM

## 2018-04-23 MED ORDER — ROSUVASTATIN CALCIUM 10 MG PO TABS: 10.0000 mg | ORAL_TABLET | Freq: Every day | ORAL | 2 refills | Status: DC

## 2018-04-23 NOTE — Telephone Encounter (Signed)
This encounter was created in error - please disregard.

## 2018-05-08 ENCOUNTER — Other Ambulatory Visit: Payer: Self-pay | Admitting: Family Medicine

## 2018-05-08 NOTE — Telephone Encounter (Signed)
RF request for zolpidem LOV: 04/22/18 Next ov: 07/22/18 Last written: 11/12/17 #30 w/ 5RF  Please advise. Thanks.

## 2018-05-14 ENCOUNTER — Telehealth: Payer: Self-pay

## 2018-05-14 NOTE — Telephone Encounter (Signed)
Message left on voice mail for patient to return call. Need to obtain correct insurance information to submit prior authorization for crestor.

## 2018-05-24 ENCOUNTER — Telehealth: Payer: Self-pay | Admitting: Family Medicine

## 2018-05-24 MED ORDER — PRAVASTATIN SODIUM 40 MG PO TABS: 40.0000 mg | ORAL_TABLET | Freq: Every day | ORAL | 2 refills | Status: DC

## 2018-05-24 NOTE — Telephone Encounter (Signed)
Copied from [place] [id number]. Topic: General - Other >> May 24, 2018  8:20 AM Yvette Rack wrote: Reason for CRM: insurance want pay for the Crestor its $195 out of pocket but his insurance will pay for the Atorvastatin 20mg ,  simvastatin 40mg   or the pravastatin 80mg  >> May 24, 2018  8:24 AM Yvette Rack wrote: Pt doesn't want to go on Atorvastatin he had a reaction to it a few years ago made his legs hurt

## 2018-05-24 NOTE — Telephone Encounter (Signed)
OK. I just eRx'd pravastatin.

## 2018-05-24 NOTE — Telephone Encounter (Signed)
Patient advised of RX at pharmacy.  

## 2018-06-11 ENCOUNTER — Other Ambulatory Visit: Payer: Self-pay | Admitting: Family Medicine

## 2018-07-03 ENCOUNTER — Other Ambulatory Visit: Payer: Self-pay | Admitting: Family Medicine

## 2018-07-05 NOTE — Telephone Encounter (Signed)
Pt called and stated that he only uses this cream prn for rash that comes and goes. He is out and would like a refill. Please advise. Thanks.

## 2018-07-14 ENCOUNTER — Other Ambulatory Visit: Payer: Self-pay | Admitting: Family Medicine

## 2018-07-15 ENCOUNTER — Other Ambulatory Visit: Payer: Self-pay | Admitting: Family Medicine

## 2018-07-22 ENCOUNTER — Encounter: Payer: Self-pay | Admitting: Family Medicine

## 2018-07-22 ENCOUNTER — Ambulatory Visit (INDEPENDENT_AMBULATORY_CARE_PROVIDER_SITE_OTHER): Payer: Medicare Other | Admitting: Family Medicine

## 2018-07-22 VITALS — BP 130/82 | HR 78 | Temp 98.2°F | Resp 16 | Ht 70.0 in | Wt 176.5 lb

## 2018-07-22 DIAGNOSIS — E7800 Pure hypercholesterolemia, unspecified: Secondary | ICD-10-CM

## 2018-07-22 DIAGNOSIS — Z0000 Encounter for general adult medical examination without abnormal findings: Secondary | ICD-10-CM

## 2018-07-22 DIAGNOSIS — E663 Overweight: Secondary | ICD-10-CM | POA: Diagnosis not present

## 2018-07-22 LAB — LIPID PANEL
Cholesterol: 200 mg/dL (ref 0–200)
HDL: 50.7 mg/dL (ref >39.00)
NonHDL: 149.14
Total CHOL/HDL Ratio: 4
Triglycerides: 279 mg/dL — ABNORMAL HIGH (ref 0.0–149.0)
VLDL: 55.8 mg/dL — ABNORMAL HIGH (ref 0.0–40.0)

## 2018-07-22 LAB — LDL CHOLESTEROL, DIRECT: Direct LDL: 110 mg/dL

## 2018-07-22 MED ORDER — FLUTICASONE PROPIONATE 0.05 % EX CREA: TOPICAL_CREAM | Freq: Two times a day (BID) | CUTANEOUS | 3 refills | Status: DC

## 2018-07-22 NOTE — Progress Notes (Signed)
WELCOME TO MEDICARE (IPPE) VISIT I explained that today's visit was for the purpose of health promotion and disease detection, as well as an introduction to Medicare and it's covered benefits.  I explained that no labs or other services would be performed today, but if any were determined to be necessary then appropriate orders/referrals would be arranged for these to be done at a future date.  Patient is a 68 y/o WM who is already an established patient with me.  Pt's medical and social history were reviewed. Specifically, we reviewed PMH/PSH/Meds/FH.  Also reviewed alcohol, tobacco, and illicit drug use.  Diet and physical activity reviewed.  He is very active at work but no formal exercise.  Working on [employer]. All of this info is also found in the appropriate sections of pt's EMR.  Lab Results  Component Value Date   TSH 3.74 04/22/2018   Lab Results  Component Value Date   WBC  04/22/2018    9.3 A Manual Differential was performed and is consistent with the Automated Differential.   HGB 15.7 04/22/2018   HCT 43.8 04/22/2018   MCV 97.2 04/22/2018   PLT 234.0 04/22/2018   Lab Results  Component Value Date   CREATININE 0.93 04/22/2018   BUN 15 04/22/2018   NA 139 04/22/2018   K 4.3 04/22/2018   CL 106 04/22/2018   CO2 26 04/22/2018   Lab Results  Component Value Date   ALT 23 04/22/2018   AST 20 04/22/2018   ALKPHOS 80 04/22/2018   BILITOT 1.4 (H) 04/22/2018   Lab Results  Component Value Date   CHOL 209 (H) 04/22/2018   Lab Results  Component Value Date   HDL 44.90 04/22/2018   No results found for: [facility] Lab Results  Component Value Date   TRIG 387.0 (H) 04/22/2018   Lab Results  Component Value Date   CHOLHDL 5 04/22/2018   Lab Results  Component Value Date   PSA 0.56 04/22/2018   PSA 0.41 04/19/2017   PSA 0.47 03/30/2016    Pt was screened with appropriate screening instrument for depression.  Current or past experiences with mood  disorders was discussed.   Depression screen PHQ 2/9 07/22/2018  Decreased Interest 0  Down, Depressed, Hopeless 0  PHQ - 2 Score 0    Fall Risk  07/22/2018 04/22/2018 03/30/2016  Falls in the past year? 0 No No     Pt's functional ability and level of safety were reviewed. Specifically, I screened for hearing impairment and fall risk.  I assessed home safety and we discussed pt's competency with activities of daily living.  He is independent in all ADLs.  EXAM: Vitals:   07/22/18 0912  BP: 130/82  Pulse: 78  Resp: 16  Temp: 98.2 F (36.8 C)  SpO2: 95%   Body mass index is 25.33 kg/m. Visual acuity screen: No exam done.  Pt got lasik procedure many years ago and no longer follows up with an eye MD. No additional physical exam required or indicated today.  End of life planning: Advanced directives and power of attorney information specific to the patient were discussed.  He has living will in place.   Education, counseling, and referral for other preventive services: Written checklist was completed and given to pt for obtaining, as appropriate, the other preventive services that are covered as separate Medicare Part B benefits. Possible services that were reviewed with pt are:  -annual wellness visit (AWV) -Bone mass measurements: n/a -Cardiovascular screening blood  tests: screening EKG x 1 if pt desires--will plan for next CPE. -Colorectal cancer screening: colonoscopy q10 yr -Counseling to prevent tobacco use: annually prn -Diabetes screening tests: annual glucose screen -Diabetes self-management training (DSMT): n/a at this time. -Glaucoma screening: annually.  pt encouraged to make appt with his eye MD. -HIV screening: n/a unless pt asks for this test. -Medical nutrition therapy: n/a at this time. -Prostate cancer screening: annually. -Seasonal influenza, pneumococcal, and Hep B vaccines: seasonal flu, pneumococcal 13 and 23 x 1 each.  Hep B n/a at this time. -screening  mammography: n/a -screening pap tests and pelvic exam: n/a -ultrasound screening for AAA: no FH.  Of the above listed services, no referrals were made today.  He was encouraged to make f/u appt with his eye MD.  Patient did not have an additional complaint/problem that was discussed and evaluated today.  Patient was given opportunity to ask any additional questions regarding Medicare and covered benefits.  Patient was informed that Medicare does not provide coverage for routine physical exams.  I answered all questions to the best of my ability today.    An After Visit Summary was printed and given to the patient.  Follow up: Return in about 6 months (around 01/20/2019) for routine chronic illness f/u (screening EKG at that appt as per IPPE)--30 min appt.  Signed:  Crissie Sickles, MD           07/22/2018

## 2018-07-22 NOTE — Addendum Note (Signed)
Addended by: Ralph Dowdy on: 07/22/2018 09:48 AM   Modules accepted: Orders

## 2018-07-22 NOTE — Patient Instructions (Signed)
Plan:   -annual wellness visit (AWV)--starting 07/2019. -Bone mass measurements: n/a -Cardiovascular screening blood tests: screening EKG x 1 if pt desires--will plan for next CPE. -Colorectal cancer screening: colonoscopy every 10 yrs  -Counseling to prevent tobacco use: annually as needed. -Diabetes screening tests: annual glucose screen will be done. -Diabetes self-management training (DSMT): n/a at this time. -Glaucoma screening: annually with his eye MD--pt encouraged to make appt with his eye MD. -HIV screening: n/a unless pt asks for this test. -Medical nutrition therapy: n/a at this time. -Prostate cancer screening: annually. -Seasonal influenza, pneumococcal, and Hep B vaccines: seasonal flu, pneumococcal 13 and 23 x 1 each.  Hep B vaccine n/a at this time. -screening mammography: n/a -screening pap tests and pelvic exam: n/a -ultrasound screening for AAA: no FH (pt does not qualify).

## 2018-07-23 ENCOUNTER — Encounter: Payer: Self-pay | Admitting: Family Medicine

## 2018-07-23 ENCOUNTER — Other Ambulatory Visit: Payer: Self-pay | Admitting: *Deleted

## 2018-07-23 DIAGNOSIS — E7800 Pure hypercholesterolemia, unspecified: Secondary | ICD-10-CM

## 2018-07-23 MED ORDER — PRAVASTATIN SODIUM 80 MG PO TABS: 80.0000 mg | ORAL_TABLET | Freq: Every day | ORAL | 0 refills | Status: DC

## 2018-08-08 ENCOUNTER — Other Ambulatory Visit: Payer: Self-pay | Admitting: Family Medicine

## 2018-08-08 ENCOUNTER — Telehealth: Payer: Self-pay | Admitting: Family Medicine

## 2018-08-08 ENCOUNTER — Encounter: Payer: Self-pay | Admitting: Family Medicine

## 2018-08-08 MED ORDER — COLCHICINE 0.6 MG PO CAPS: ORAL_CAPSULE | ORAL | 1 refills | Status: DC

## 2018-08-08 NOTE — Telephone Encounter (Signed)
Copied from [place] [id number]. Topic: General - Other >> Aug 08, 2018 11:51 AM Keene Breath wrote: Reason for CRM: Patient called to request that the doctor send in a script for Mitigare 0.6 capsule, which is what the patient took for his gout last year.  Patient stated that the pharmacy said he would have to contact his provider in order to get this medication.  Patient has a flare up now and it is very hard for his to walk.  Please advise and call patient once this has been submitted.  CB3 [phone]

## 2018-08-08 NOTE — Telephone Encounter (Signed)
Please advise. Thanks.  

## 2018-08-08 NOTE — Telephone Encounter (Signed)
Pt advised and voiced understanding.   

## 2018-08-08 NOTE — Telephone Encounter (Signed)
OK, mitigare eRx'd.

## 2018-08-09 ENCOUNTER — Other Ambulatory Visit: Payer: Self-pay | Admitting: Family Medicine

## 2018-08-09 MED ORDER — COLCHICINE 0.6 MG PO CAPS: ORAL_CAPSULE | ORAL | 2 refills | Status: DC

## 2018-08-09 NOTE — Telephone Encounter (Signed)
Pt request new Rx for qty 30 tab per gout med due to cost. Please advise. Thanks.

## 2018-08-13 DIAGNOSIS — L821 Other seborrheic keratosis: Secondary | ICD-10-CM | POA: Diagnosis not present

## 2018-08-13 DIAGNOSIS — L309 Dermatitis, unspecified: Secondary | ICD-10-CM | POA: Diagnosis not present

## 2018-08-13 DIAGNOSIS — Z23 Encounter for immunization: Secondary | ICD-10-CM | POA: Diagnosis not present

## 2018-08-13 DIAGNOSIS — Z872 Personal history of diseases of the skin and subcutaneous tissue: Secondary | ICD-10-CM | POA: Diagnosis not present

## 2018-08-13 DIAGNOSIS — D225 Melanocytic nevi of trunk: Secondary | ICD-10-CM | POA: Diagnosis not present

## 2018-08-21 ENCOUNTER — Other Ambulatory Visit: Payer: Self-pay | Admitting: Family Medicine

## 2018-09-21 ENCOUNTER — Other Ambulatory Visit: Payer: Self-pay | Admitting: Family Medicine

## 2018-09-23 NOTE — Telephone Encounter (Signed)
RF request for IBP LOV: 04/22/18 Next ov:  01/06/19 Last written: 12/03/17 #90 w/ 5RF  Please advise. Thanks.

## 2018-10-21 ENCOUNTER — Other Ambulatory Visit (INDEPENDENT_AMBULATORY_CARE_PROVIDER_SITE_OTHER): Payer: Medicare Other

## 2018-10-21 ENCOUNTER — Other Ambulatory Visit: Payer: Medicare Other

## 2018-10-21 ENCOUNTER — Other Ambulatory Visit: Payer: Self-pay

## 2018-10-21 DIAGNOSIS — E7800 Pure hypercholesterolemia, unspecified: Secondary | ICD-10-CM | POA: Diagnosis not present

## 2018-10-21 LAB — LIPID PANEL
Cholesterol: 180 mg/dL (ref 0–200)
HDL: 44.1 mg/dL (ref >39.00)
NonHDL: 135.62
Total CHOL/HDL Ratio: 4
Triglycerides: 221 mg/dL — ABNORMAL HIGH (ref 0.0–149.0)
VLDL: 44.2 mg/dL — ABNORMAL HIGH (ref 0.0–40.0)

## 2018-10-21 LAB — LDL CHOLESTEROL, DIRECT: Direct LDL: 98 mg/dL

## 2018-10-21 MED ORDER — PRAVASTATIN SODIUM 80 MG PO TABS: 80.0000 mg | ORAL_TABLET | Freq: Every day | ORAL | 3 refills | Status: DC

## 2018-10-21 NOTE — Progress Notes (Signed)
A user error has taken place: encounter opened in error, closed for administrative reasons Ordered pravastatin RF only. Signed:  Crissie Sickles, MD           10/21/2018 .

## 2018-10-21 NOTE — Addendum Note (Signed)
Addended by: Tammi Sou on: 10/21/2018 04:49 PM   Modules accepted: Orders

## 2018-10-22 ENCOUNTER — Other Ambulatory Visit: Payer: Self-pay | Admitting: Family Medicine

## 2018-10-23 NOTE — Telephone Encounter (Signed)
RF request for Alprazolam LOV: 07/22/18 Next ov: 01/06/19 Last written: 04/22/18 #120 w/ 5RF.  Please advise on refill. Medication pending

## 2018-11-03 ENCOUNTER — Other Ambulatory Visit: Payer: Self-pay | Admitting: Family Medicine

## 2018-11-04 NOTE — Telephone Encounter (Signed)
RF request for Ambien  LOV: 07/22/18 Next ov: 01/06/19 Last written: 05/08/18 #30 w/5RF. Last CSC/UDS: none for this med.  PMP aware printed. Placed on your desk. Medication pending

## 2019-01-06 ENCOUNTER — Ambulatory Visit: Payer: Medicare Other | Admitting: Family Medicine

## 2019-01-08 ENCOUNTER — Encounter: Payer: Self-pay | Admitting: Family Medicine

## 2019-01-15 ENCOUNTER — Other Ambulatory Visit: Payer: Self-pay | Admitting: Family Medicine

## 2019-01-16 ENCOUNTER — Encounter: Payer: Self-pay | Admitting: Family Medicine

## 2019-01-16 ENCOUNTER — Other Ambulatory Visit: Payer: Self-pay

## 2019-01-16 ENCOUNTER — Ambulatory Visit (INDEPENDENT_AMBULATORY_CARE_PROVIDER_SITE_OTHER): Payer: Medicare Other | Admitting: Family Medicine

## 2019-01-16 VITALS — BP 126/82 | HR 74 | Temp 98.2°F | Resp 16 | Ht 70.0 in | Wt 176.0 lb

## 2019-01-16 DIAGNOSIS — F411 Generalized anxiety disorder: Secondary | ICD-10-CM | POA: Diagnosis not present

## 2019-01-16 DIAGNOSIS — Z79899 Other long term (current) drug therapy: Secondary | ICD-10-CM

## 2019-01-16 DIAGNOSIS — R030 Elevated blood-pressure reading, without diagnosis of hypertension: Secondary | ICD-10-CM | POA: Diagnosis not present

## 2019-01-16 DIAGNOSIS — G4700 Insomnia, unspecified: Secondary | ICD-10-CM

## 2019-01-16 DIAGNOSIS — E7800 Pure hypercholesterolemia, unspecified: Secondary | ICD-10-CM | POA: Diagnosis not present

## 2019-01-16 NOTE — Patient Instructions (Addendum)
Start checking blood pressure and heart 3 times a week. If top number consistently > 140 or bottom number >90 then call or return.  DASH diet was given today.   DASH Eating Plan DASH stands for "Dietary Approaches to Stop Hypertension." The DASH eating plan is a healthy eating plan that has been shown to reduce high blood pressure (hypertension). It may also reduce your risk for type 2 diabetes, heart disease, and stroke. The DASH eating plan may also help with weight loss. What are tips for following this plan?  General guidelines  Avoid eating more than 2,300 mg (milligrams) of salt (sodium) a day. If you have hypertension, you may need to reduce your sodium intake to 1,500 mg a day.  Limit alcohol intake to no more than 1 drink a day for nonpregnant women and 2 drinks a day for men. One drink equals 12 oz of beer, 5 oz of wine, or 1 oz of hard liquor.  Work with your health care provider to maintain a healthy body weight or to lose weight. Ask what an ideal weight is for you.  Get at least 30 minutes of exercise that causes your heart to beat faster (aerobic exercise) most days of the week. Activities may include walking, swimming, or biking.  Work with your health care provider or diet and nutrition specialist (dietitian) to adjust your eating plan to your individual calorie needs. Reading food labels   Check food labels for the amount of sodium per serving. Choose foods with less than 5 percent of the Daily Value of sodium. Generally, foods with less than 300 mg of sodium per serving fit into this eating plan.  To find whole grains, look for the word "whole" as the first word in the ingredient list. Shopping  Buy products labeled as "low-sodium" or "no salt added."  Buy fresh foods. Avoid canned foods and premade or frozen meals. Cooking  Avoid adding salt when cooking. Use salt-free seasonings or herbs instead of table salt or sea salt. Check with your health care provider or  pharmacist before using salt substitutes.  Do not fry foods. Cook foods using healthy methods such as baking, boiling, grilling, and broiling instead.  Cook with heart-healthy oils, such as olive, canola, soybean, or sunflower oil. Meal planning  Eat a balanced diet that includes: ? 5 or more servings of fruits and vegetables each day. At each meal, try to fill half of your plate with fruits and vegetables. ? Up to 6-8 servings of whole grains each day. ? Less than 6 oz of lean meat, poultry, or fish each day. A 3-oz serving of meat is about the same size as a deck of cards. One egg equals 1 oz. ? 2 servings of low-fat dairy each day. ? A serving of nuts, seeds, or beans 5 times each week. ? Heart-healthy fats. Healthy fats called Omega-3 fatty acids are found in foods such as flaxseeds and coldwater fish, like sardines, salmon, and mackerel.  Limit how much you eat of the following: ? Canned or prepackaged foods. ? Food that is high in trans fat, such as fried foods. ? Food that is high in saturated fat, such as fatty meat. ? Sweets, desserts, sugary drinks, and other foods with added sugar. ? Full-fat dairy products.  Do not salt foods before eating.  Try to eat at least 2 vegetarian meals each week.  Eat more home-cooked food and less restaurant, buffet, and fast food.  When eating at a restaurant,  ask that your food be prepared with less salt or no salt, if possible. What foods are recommended? The items listed may not be a complete list. Talk with your dietitian about what dietary choices are best for you. Grains Whole-grain or whole-wheat bread. Whole-grain or whole-wheat pasta. Brown rice. [place]. Bulgur. Whole-grain and low-sodium cereals. Pita bread. Low-fat, low-sodium crackers. Whole-wheat flour tortillas. Vegetables Fresh or frozen vegetables (raw, steamed, roasted, or grilled). Low-sodium or reduced-sodium tomato and vegetable juice. Low-sodium or  reduced-sodium tomato sauce and tomato paste. Low-sodium or reduced-sodium canned vegetables. Fruits All fresh, dried, or frozen fruit. Canned fruit in natural juice (without added sugar). Meat and other protein foods Skinless chicken or [place]. Ground chicken or [place]. Pork with fat trimmed off. Fish and seafood. Egg whites. Dried beans, peas, or lentils. Unsalted nuts, nut butters, and seeds. Unsalted canned beans. Lean cuts of beef with fat trimmed off. Low-sodium, lean deli meat. Dairy Low-fat (1%) or fat-free (skim) milk. Fat-free, low-fat, or reduced-fat cheeses. Nonfat, low-sodium ricotta or cottage cheese. Low-fat or nonfat yogurt. Low-fat, low-sodium cheese. Fats and oils Soft margarine without trans fats. Vegetable oil. Low-fat, reduced-fat, or light mayonnaise and salad dressings (reduced-sodium). Canola, safflower, olive, soybean, and sunflower oils. Avocado. Seasoning and other foods Herbs. Spices. Seasoning mixes without salt. Unsalted popcorn and pretzels. Fat-free sweets. What foods are not recommended? The items listed may not be a complete list. Talk with your dietitian about what dietary choices are best for you. Grains Baked goods made with fat, such as croissants, muffins, or some breads. Dry pasta or rice meal packs. Vegetables Creamed or fried vegetables. Vegetables in a cheese sauce. Regular canned vegetables (not low-sodium or reduced-sodium). Regular canned tomato sauce and paste (not low-sodium or reduced-sodium). Regular tomato and vegetable juice (not low-sodium or reduced-sodium). Angie Fava. Olives. Fruits Canned fruit in a light or heavy syrup. Fried fruit. Fruit in cream or butter sauce. Meat and other protein foods Fatty cuts of meat. Ribs. Fried meat. Berniece Salines. Sausage. Bologna and other processed lunch meats. Salami. Fatback. Hotdogs. Bratwurst. Salted nuts and seeds. Canned beans with added salt. Canned or smoked fish. Whole eggs or egg yolks. Chicken or [place]  with skin. Dairy Whole or 2% milk, cream, and half-and-half. Whole or full-fat cream cheese. Whole-fat or sweetened yogurt. Full-fat cheese. Nondairy creamers. Whipped toppings. Processed cheese and cheese spreads. Fats and oils Butter. Stick margarine. Lard. Shortening. Ghee. Bacon fat. Tropical oils, such as coconut, palm kernel, or palm oil. Seasoning and other foods Salted popcorn and pretzels. Onion salt, garlic salt, seasoned salt, table salt, and sea salt. Worcestershire sauce. Tartar sauce. Barbecue sauce. Teriyaki sauce. Soy sauce, including reduced-sodium. Steak sauce. Canned and packaged gravies. Fish sauce. Oyster sauce. Cocktail sauce. Horseradish that you find on the shelf. Ketchup. Mustard. Meat flavorings and tenderizers. Bouillon cubes. Hot sauce and Tabasco sauce. Premade or packaged marinades. Premade or packaged taco seasonings. Relishes. Regular salad dressings. Where to find more information:  National Heart, Lung, and [place]: https://wilson-eaton.com/  American Heart Association: www.heart.org Summary  The DASH eating plan is a healthy eating plan that has been shown to reduce high blood pressure (hypertension). It may also reduce your risk for type 2 diabetes, heart disease, and stroke.  With the DASH eating plan, you should limit salt (sodium) intake to 2,300 mg a day. If you have hypertension, you may need to reduce your sodium intake to 1,500 mg a day.  When on the DASH eating plan, aim to eat more fresh  fruits and vegetables, whole grains, lean proteins, low-fat dairy, and heart-healthy fats.  Work with your health care provider or diet and nutrition specialist (dietitian) to adjust your eating plan to your individual calorie needs. This information is not intended to replace advice given to you by your health care provider. Make sure you discuss any questions you have with your health care provider. Document Released: 06/22/2011 Document Revised: 06/15/2017  Document Reviewed: 06/26/2016 Elsevier Patient Education  2020 [employer].

## 2019-01-16 NOTE — Progress Notes (Signed)
OFFICE VISIT  01/16/2019   CC:  Chief Complaint  Patient presents with  . Follow-up    RCI, pt is fasting   HPI:    Patient is a 68 y.o. Caucasian male who presents for 6 mo f/u insomnia, GAD, and, HLD. He does have hx of HTN listed in [place] but is not on any antihypertensive med.  Insomnia: taking ambien every night, no adverse effects.  Anxiety: typically takes alpraz 2-3 times per day, sometimes 4.  HLD: taking statin and w/out side effects.  BP: has cuff, not checking though. He agrees to start checking bp at home. He is extremely busy at work, has a lot of stress. He does 10-12K steps per day.  ROS: no CP, no SOB, no wheezing, no cough, no dizziness, no HAs, no rashes, no melena/hematochezia.  No polyuria or polydipsia.  No myalgias or arthralgias.  Past Medical History:  Diagnosis Date  . Anxiety   . BPPV (benign paroxysmal positional vertigo) 06/2014  . Chronic insomnia   . Chronic lumbar pain    DDD; ibuprofen, occasional muscle relaxers.  MRI L-spine 2007 showed some bulging discs with nerve impingement.  Has never had back injection or surgery.  . Contact dermatitis and eczema   . Diverticulitis   . Genital herpes 08/2015   HSV type I on culture  . Gilbert syndrome   . Gout    "always right big toe"--about one episode every 4 yrs.  Right ankle flare, 02/2016.  Marland Kitchen History of adenomatous polyp of colon 2005  . History of bronchitis 08/11/2012  . History of Clostridium difficile colitis 2016  . HTN (hypertension)   . Hyperlipidemia, mixed    Statin started 04/2018  . PUD (peptic ulcer disease)     Past Surgical History:  Procedure Laterality Date  . COLONOSCOPY  07/2013   Normal.  Recall 10 yrs.  . COLONOSCOPY W/ POLYPECTOMY  2005   Dr. Lyla Son    Outpatient Medications Prior to Visit  Medication Sig Dispense Refill  . ALPRAZolam (XANAX) 0.5 MG tablet TAKE 1 TABLET BY MOUTH FOUR TIMES DAILY AS NEEDED FOR STRESS 120 tablet 5  . Ascorbic Acid (VITAMIN C)  1000 MG tablet Take 1,000 mg by mouth daily.      Marland Kitchen aspirin 81 MG tablet Take 81 mg by mouth daily.      . fluticasone (CUTIVATE) 0.05 % cream Apply topically 2 (two) times daily. 30 g 3  . fluticasone (FLONASE) 50 MCG/ACT nasal spray instill 2 sprays into each nostril once daily if needed for allergies 16 g 5  . ibuprofen (ADVIL,MOTRIN) 800 MG tablet TAKE 1 TABLET BY MOUTH THREE TIMES DAILY WITH FOOD IF NEEDED FOR PAIN 90 tablet 5  . Multiple Vitamin (MULTIVITAMIN) tablet Take 1 tablet by mouth daily.      . pantoprazole (PROTONIX) 40 MG tablet TAKE 1 TABLET BY MOUTH EVERY MORNING 90 tablet 1  . pravastatin (PRAVACHOL) 80 MG tablet Take 1 tablet (80 mg total) by mouth daily. 90 tablet 3  . saccharomyces boulardii (FLORASTOR) 250 MG capsule Take 1 capsules twice daily. 60 capsule 3  . zolpidem (AMBIEN CR) 12.5 MG CR tablet TAKE 1 TABLET BY MOUTH AT BEDTIME AS NEEDED 30 tablet 5  . Colchicine (MITIGARE) 0.6 MG CAPS 2 caps po at onset of gout flare, then take another 1 cap 1 hour later (Patient not taking: Reported on 01/16/2019) 30 capsule 2   No facility-administered medications prior to visit.  Allergies  Allergen Reactions  . Prednisone Other (See Comments)    Insomnia, elevated BP and HR  . Augmentin [Amoxicillin-Pot Clavulanate] Diarrhea    Upset stomach    ROS As per HPI  PE: Initial bp 147/91 with automated cuff.  Repeat with manual cuff at end of visit: 126/82 Blood pressure 126/82, pulse 74, temperature 98.2 F (36.8 C), temperature source Temporal, resp. rate 16, height 5\' 10"  (1.778 m), weight 176 lb (79.8 kg), SpO2 97 %. Gen: Alert, well appearing.  Patient is oriented to person, place, time, and situation. AFFECT: pleasant, lucid thought and speech. CV: RRR, no m/r/g.   LUNGS: CTA bilat, nonlabored resps, good aeration in all lung fields. EXT: no clubbing or cyanosis.  no edema.    LABS:  Lab Results  Component Value Date   TSH 3.74 04/22/2018   Lab Results   Component Value Date   WBC  04/22/2018    9.3 A Manual Differential was performed and is consistent with the Automated Differential.   HGB 15.7 04/22/2018   HCT 43.8 04/22/2018   MCV 97.2 04/22/2018   PLT 234.0 04/22/2018   Lab Results  Component Value Date   CREATININE 0.93 04/22/2018   BUN 15 04/22/2018   NA 139 04/22/2018   K 4.3 04/22/2018   CL 106 04/22/2018   CO2 26 04/22/2018   Lab Results  Component Value Date   ALT 23 04/22/2018   AST 20 04/22/2018   ALKPHOS 80 04/22/2018   BILITOT 1.4 (H) 04/22/2018   Lab Results  Component Value Date   CHOL 180 10/21/2018   Lab Results  Component Value Date   HDL 44.10 10/21/2018   No results found for: [facility] Lab Results  Component Value Date   TRIG 221.0 (H) 10/21/2018   Lab Results  Component Value Date   CHOLHDL 4 10/21/2018   Lab Results  Component Value Date   PSA 0.56 04/22/2018   PSA 0.41 04/19/2017   PSA 0.47 03/30/2016   Lab Results  Component Value Date   LABURIC 7.7 04/22/2018    IMPRESSION AND PLAN:  1) GAD: stable on scheduled 2-4 doses of alprazolam per day. UDS today. CSC UTD.  2) Insomnia/high risk med use-->CSC UTD.  UDS obtained today.  3) HLD: tolerating statin. No blood work indicated today.  4) Elevated bp w/out dx of HTN:  Instructions: Start checking blood pressure and heart 3 times a week. If top number consistently > 140 or bottom number >90 then call or return. DASH diet was given today.  4) Preventative health: all UTD.  An After Visit Summary was printed and given to the patient.  FOLLOW UP: Return in about 6 months (around 07/19/2019) for annual CPE (fasting).  Signed:  Crissie Sickles, MD           01/16/2019

## 2019-01-19 LAB — PAIN MGMT, PROFILE 8 W/CONF, U
6 Acetylmorphine: NEGATIVE ng/mL
Alcohol Metabolites: POSITIVE ng/mL — AB (ref <500)
Alphahydroxyalprazolam: 281 ng/mL
Alphahydroxymidazolam: NEGATIVE ng/mL
Alphahydroxytriazolam: NEGATIVE ng/mL
Aminoclonazepam: NEGATIVE ng/mL
Amphetamines: NEGATIVE ng/mL
Benzodiazepines: POSITIVE ng/mL
Buprenorphine, Urine: NEGATIVE ng/mL
Cocaine Metabolite: NEGATIVE ng/mL
Creatinine: 117.4 mg/dL
Ethyl Glucuronide (ETG): 4814 ng/mL
Ethyl Sulfate (ETS): 1420 ng/mL
Hydroxyethylflurazepam: NEGATIVE ng/mL
Lorazepam: NEGATIVE ng/mL
MDMA: NEGATIVE ng/mL
Marijuana Metabolite: 46 ng/mL
Marijuana Metabolite: POSITIVE ng/mL
Nordiazepam: NEGATIVE ng/mL
Opiates: NEGATIVE ng/mL
Oxazepam: NEGATIVE ng/mL
Oxidant: NEGATIVE ug/mL
Oxycodone: NEGATIVE ng/mL
Temazepam: NEGATIVE ng/mL
pH: 5.9 (ref 4.5–9.0)

## 2019-01-29 ENCOUNTER — Other Ambulatory Visit: Payer: Self-pay | Admitting: Family Medicine

## 2019-01-29 ENCOUNTER — Telehealth: Payer: Self-pay | Admitting: Family Medicine

## 2019-01-29 DIAGNOSIS — Z79899 Other long term (current) drug therapy: Secondary | ICD-10-CM

## 2019-01-29 NOTE — Telephone Encounter (Signed)
Pls call pt to schedule lab appt (not fasting) for sometime in the first week of August.-thx

## 2019-01-30 NOTE — Telephone Encounter (Signed)
Patient has been scheduled

## 2019-02-21 ENCOUNTER — Ambulatory Visit: Payer: Medicare Other

## 2019-02-21 ENCOUNTER — Other Ambulatory Visit: Payer: Self-pay

## 2019-02-21 DIAGNOSIS — Z79899 Other long term (current) drug therapy: Secondary | ICD-10-CM | POA: Diagnosis not present

## 2019-02-23 LAB — PAIN MGMT, PROFILE 8 W/CONF, U
6 Acetylmorphine: NEGATIVE ng/mL
Alcohol Metabolites: NEGATIVE ng/mL (ref <500)
Alphahydroxyalprazolam: 155 ng/mL
Alphahydroxymidazolam: NEGATIVE ng/mL
Alphahydroxytriazolam: NEGATIVE ng/mL
Aminoclonazepam: NEGATIVE ng/mL
Amphetamines: NEGATIVE ng/mL
Benzodiazepines: POSITIVE ng/mL
Buprenorphine, Urine: NEGATIVE ng/mL
Cocaine Metabolite: NEGATIVE ng/mL
Creatinine: 64 mg/dL
Hydroxyethylflurazepam: NEGATIVE ng/mL
Lorazepam: NEGATIVE ng/mL
MDMA: NEGATIVE ng/mL
Marijuana Metabolite: NEGATIVE ng/mL
Nordiazepam: NEGATIVE ng/mL
Opiates: NEGATIVE ng/mL
Oxazepam: NEGATIVE ng/mL
Oxidant: NEGATIVE ug/mL
Oxycodone: NEGATIVE ng/mL
Temazepam: NEGATIVE ng/mL
pH: 6.3 (ref 4.5–9.0)

## 2019-05-05 ENCOUNTER — Other Ambulatory Visit: Payer: Self-pay | Admitting: Family Medicine

## 2019-05-05 NOTE — Telephone Encounter (Signed)
Rf request for [facility]. Last OV 01/16/2019. Next OV 08/04/2019 Last RX 11/04/2018 # 30 x 5 RF>  Please advise.

## 2019-05-20 ENCOUNTER — Telehealth: Payer: Self-pay | Admitting: Family Medicine

## 2019-05-20 ENCOUNTER — Other Ambulatory Visit: Payer: Self-pay

## 2019-05-20 ENCOUNTER — Other Ambulatory Visit: Payer: Self-pay | Admitting: Family Medicine

## 2019-05-20 MED ORDER — ALPRAZOLAM 0.5 MG PO TABS: ORAL_TABLET | ORAL | 5 refills | Status: DC

## 2019-05-20 NOTE — Telephone Encounter (Signed)
Contacted both [facility] that patient was told to try and both have pt's medication in the 0.5mg  tabs available. Patient was contacted to see which one he would prefer Rx sent to, pt chose [facility] [employer]. Rx sent.

## 2019-05-20 NOTE — Telephone Encounter (Signed)
Refill was just sent in today for alprazolam 0.5mg . (120,5). His next f/u appt is 08/04/19.  Please advise, thanks.

## 2019-05-20 NOTE — Telephone Encounter (Signed)
I'll print out a new alprazolam rx. Pls fax to whichever pharmacy has 0.5mg  tabs available.

## 2019-05-20 NOTE — Telephone Encounter (Signed)
Requesting:alprazolam Contract: 04/22/18 UDS: 02/21/19 Last Visit:01/16/19 Next Visit:08/04/19 Last Refill: 10/23/18 (120,5)  Please Advise. Medication pending

## 2019-05-20 NOTE — Telephone Encounter (Signed)
Patient's pharmacy states they are OUT of alprazolam 0.5 mg.  Patient was told to try [facility] or [facility] in [place].   Please advise if this can be done.   Thanks!

## 2019-07-19 ENCOUNTER — Other Ambulatory Visit: Payer: Self-pay | Admitting: Family Medicine

## 2019-07-24 ENCOUNTER — Ambulatory Visit (INDEPENDENT_AMBULATORY_CARE_PROVIDER_SITE_OTHER): Payer: Medicare Other | Admitting: Family Medicine

## 2019-07-24 ENCOUNTER — Other Ambulatory Visit: Payer: Self-pay

## 2019-07-24 ENCOUNTER — Encounter: Payer: Self-pay | Admitting: Family Medicine

## 2019-07-24 VITALS — BP 158/95 | HR 78 | Temp 98.6°F | Resp 16 | Ht 70.0 in | Wt 178.8 lb

## 2019-07-24 DIAGNOSIS — M7662 Achilles tendinitis, left leg: Secondary | ICD-10-CM

## 2019-07-24 DIAGNOSIS — M79672 Pain in left foot: Secondary | ICD-10-CM

## 2019-07-24 NOTE — Progress Notes (Signed)
OFFICE VISIT  07/24/2019   CC:  Chief Complaint  Patient presents with  . redness of heel    left, for the last 2 mornings    HPI:    Patient is a 70 y.o. Caucasian male who presents for redness of left heel. Onset 3 d/a--first noted upon waking up, swelling and redness focally on back of L heel.  Some redness diffusely up back of calf yesterday but gone today.  He showed me a picture of this today.  No joint pains, no recent injury.  Does not play sports or run for exercise. Wears boots all day at work-new pair recently.  No pop has been heard or felt. Has been applying ice.  Has tried to stay off it completely all day today. Ibup 800mg  helps significantly with the pain.  ROS: no CP, no SOB, no wheezing, no cough, no dizziness, no HAs, no rashes, no melena/hematochezia.  No polyuria or polydipsia.  No myalgias or arthralgias.    Past Medical History:  Diagnosis Date  . Anxiety   . BPPV (benign paroxysmal positional vertigo) 06/2014  . Chronic insomnia   . Chronic lumbar pain    DDD; ibuprofen, occasional muscle relaxers.  MRI L-spine 2007 showed some bulging discs with nerve impingement.  Has never had back injection or surgery.  . Contact dermatitis and eczema   . Diverticulitis   . Genital herpes 08/2015   HSV type I on culture  . Gilbert syndrome   . Gout    "always right big toe"--about one episode every 4 yrs.  Right ankle flare, 02/2016.  Marland Kitchen History of adenomatous polyp of colon 2005  . History of bronchitis 08/11/2012  . History of Clostridium difficile colitis 2016  . HTN (hypertension)   . Hyperlipidemia, mixed    Statin started 04/2018  . PUD (peptic ulcer disease)     Past Surgical History:  Procedure Laterality Date  . COLONOSCOPY  07/2013   Normal.  Recall 10 yrs.  . COLONOSCOPY W/ POLYPECTOMY  2005   Dr. Lyla Son    Outpatient Medications Prior to Visit  Medication Sig Dispense Refill  . ALPRAZolam (XANAX) 0.5 MG tablet TAKE 1 TABLET BY MOUTH FOUR  TIMES DAILY AS NEEDED FOR STRESS. 120 tablet 5  . aspirin 81 MG tablet Take 81 mg by mouth daily.      . Colchicine (MITIGARE) 0.6 MG CAPS 2 caps po at onset of gout flare, then take another 1 cap 1 hour later 30 capsule 2  . fluticasone (CUTIVATE) 0.05 % cream Apply topically 2 (two) times daily. 30 g 3  . fluticasone (FLONASE) 50 MCG/ACT nasal spray instill 2 sprays into each nostril once daily if needed for allergies 16 g 5  . ibuprofen (ADVIL,MOTRIN) 800 MG tablet TAKE 1 TABLET BY MOUTH THREE TIMES DAILY WITH FOOD IF NEEDED FOR PAIN 90 tablet 5  . pantoprazole (PROTONIX) 40 MG tablet TAKE 1 TABLET BY MOUTH EVERY MORNING 90 tablet 1  . pravastatin (PRAVACHOL) 80 MG tablet Take 1 tablet (80 mg total) by mouth daily. 90 tablet 3  . saccharomyces boulardii (FLORASTOR) 250 MG capsule Take 1 capsules twice daily. 60 capsule 3  . zolpidem (AMBIEN CR) 12.5 MG CR tablet TAKE 1 TABLET BY MOUTH AT BEDTIME AS NEEDED 30 tablet 5  . Multiple Vitamin (MULTIVITAMIN) tablet Take 1 tablet by mouth daily.      . Ascorbic Acid (VITAMIN C) 1000 MG tablet Take 1,000 mg by mouth daily.  No facility-administered medications prior to visit.    Allergies  Allergen Reactions  . Prednisone Other (See Comments)    Insomnia, elevated BP and HR  . Augmentin [Amoxicillin-Pot Clavulanate] Diarrhea    Upset stomach    ROS As per HPI  PE: Blood pressure (!) 158/95, pulse 78, temperature 98.6 F (37 C), temperature source Temporal, resp. rate 16, height 5\' 10"  (1.778 m), weight 178 lb 12.8 oz (81.1 kg), SpO2 96 %. Body mass index is 25.66 kg/m.  Gen: Alert, well appearing.  Patient is oriented to person, place, time, and situation. AFFECT: pleasant, lucid thought and speech. Left heel: focal swelling and erythema and tenderness over the calcaneal bursa/insertion site of achilles tendon on calcaneus.  Minimal tenderness in distal aspect of the achilles tendon region, but no erythema in this region today.  He  has decent strength with ankle plantar and dorsal flexion despite some significant pain with doing this.    LABS:    Chemistry      Component Value Date/Time   NA 139 04/22/2018 0855   K 4.3 04/22/2018 0855   CL 106 04/22/2018 0855   CO2 26 04/22/2018 0855   BUN 15 04/22/2018 0855   CREATININE 0.93 04/22/2018 0855      Component Value Date/Time   CALCIUM 9.3 04/22/2018 0855   ALKPHOS 80 04/22/2018 0855   AST 20 04/22/2018 0855   ALT 23 04/22/2018 0855   BILITOT 1.4 (H) 04/22/2018 0855     Lab Results  Component Value Date   LABURIC 7.7 04/22/2018   Lab Results  Component Value Date   TSH 3.74 04/22/2018    IMPRESSION AND PLAN:  No problem-specific Assessment & Plan notes found for this encounter.  Retrocalcaneal bursitis vs achilles tendonitis. Start ice regimen: 20 MIN tid, wear shoes that allow a little space behind heel->no heavy boots. Voltaren otc qid recommended to the area + 800 mg ibuprofen tid with food. Refer to sports med.  An After Visit Summary was printed and given to the patient.  FOLLOW UP: Return if symptoms worsen or fail to improve.  Signed:  Crissie Sickles, MD           07/24/2019

## 2019-07-24 NOTE — Patient Instructions (Signed)
Ice heel for 20 min three times a day. Use over the counter voltaren gel 4 times a day on the area. Take ibuprofen 800 mg tab 3 times a day with food.

## 2019-07-24 NOTE — Telephone Encounter (Signed)
Pt was called and told he needed appt. Scheduled appt for 2pm today.

## 2019-07-25 ENCOUNTER — Other Ambulatory Visit: Payer: Self-pay

## 2019-07-25 MED ORDER — COLCHICINE 0.6 MG PO CAPS: ORAL_CAPSULE | ORAL | 2 refills | Status: DC

## 2019-07-28 ENCOUNTER — Ambulatory Visit: Payer: Self-pay

## 2019-07-28 ENCOUNTER — Encounter: Payer: Self-pay | Admitting: Family Medicine

## 2019-07-28 ENCOUNTER — Other Ambulatory Visit: Payer: Self-pay

## 2019-07-28 ENCOUNTER — Ambulatory Visit (INDEPENDENT_AMBULATORY_CARE_PROVIDER_SITE_OTHER): Payer: Medicare Other | Admitting: Family Medicine

## 2019-07-28 VITALS — Ht 70.0 in | Wt 180.0 lb

## 2019-07-28 DIAGNOSIS — M6528 Calcific tendinitis, other site: Secondary | ICD-10-CM

## 2019-07-28 DIAGNOSIS — M7660 Achilles tendinitis, unspecified leg: Secondary | ICD-10-CM

## 2019-07-28 NOTE — Progress Notes (Signed)
Keith Sanders - 69 y.o. male MRN [id number]  Date of birth: April 27, 1951  SUBJECTIVE:  Including CC & ROS.  Chief Complaint  Patient presents with  . Foot Pain    left achilles    Keith Sanders is a 69 y.o. male that is presenting with a left Achilles pain.  The pain started an hour last Thursday.  It was severe to where he is unable to walk.  He is having red streaking proximally.  Since that time he is started on ibuprofen and his symptoms have greatly resolved.  He denies any new or different shoes.  Has been walking like he normally does.  Denies any new or different exercises.  The pain was localized at the base of the Achilles.  Had a small bump that appeared.  No history of surgery   Review of Systems See HPI   HISTORY: Past Medical, Surgical, Social, and Family History Reviewed & Updated per EMR.   Pertinent Historical Findings include:  Past Medical History:  Diagnosis Date  . Anxiety   . BPPV (benign paroxysmal positional vertigo) 06/2014  . Chronic insomnia   . Chronic lumbar pain    DDD; ibuprofen, occasional muscle relaxers.  MRI L-spine 2007 showed some bulging discs with nerve impingement.  Has never had back injection or surgery.  . Contact dermatitis and eczema   . Diverticulitis   . Genital herpes 08/2015   HSV type I on culture  . Gilbert syndrome   . Gout    "always right big toe"--about one episode every 4 yrs.  Right ankle flare, 02/2016.  Marland Kitchen History of adenomatous polyp of colon 2005  . History of bronchitis 08/11/2012  . History of Clostridium difficile colitis 2016  . HTN (hypertension)   . Hyperlipidemia, mixed    Statin started 04/2018  . PUD (peptic ulcer disease)     Past Surgical History:  Procedure Laterality Date  . COLONOSCOPY  07/2013   Normal.  Recall 10 yrs.  . COLONOSCOPY W/ POLYPECTOMY  2005   Dr. Lyla Son    Allergies  Allergen Reactions  . Prednisone Other (See Comments)    Insomnia, elevated BP and HR  . Augmentin  [Amoxicillin-Pot Clavulanate] Diarrhea    Upset stomach    Family History  Problem Relation Age of Onset  . Heart attack Father 70  . Heart disease Father        MI age 83  . Heart disease Other   . Colon polyps Mother   . Ovarian cancer Sister   . Colon cancer Neg Hx      Social History   Socioeconomic History  . Marital status: Married    Spouse name: Not on file  . Number of children: 3  . Years of education: Not on file  . Highest education level: Not on file  Occupational History  . Occupation: Press photographer  Tobacco Use  . Smoking status: Never Smoker  . Smokeless tobacco: Never Used  Substance and Sexual Activity  . Alcohol use: Yes    Alcohol/week: 14.0 standard drinks    Types: 14 Cans of beer per week  . Drug use: No  . Sexual activity: Yes  Other Topics Concern  . Not on file  Social History Narrative   Married, 3 grown children.   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   Lived in [place] area all his life.   No T/A/Ds.   Walks 3-4 times per day.   Social Determinants of Health  Financial Resource Strain:   . Difficulty of Paying Living Expenses: Not on file  Food Insecurity:   . Worried About Charity fundraiser in the Last Year: Not on file  . Ran Out of Food in the Last Year: Not on file  Transportation Needs:   . Lack of Transportation (Medical): Not on file  . Lack of Transportation (Non-Medical): Not on file  Physical Activity:   . Days of Exercise per Week: Not on file  . Minutes of Exercise per Session: Not on file  Stress:   . Feeling of Stress : Not on file  Social Connections:   . Frequency of Communication with Friends and Family: Not on file  . Frequency of Social Gatherings with Friends and Family: Not on file  . Attends Religious Services: Not on file  . Active Member of Clubs or Organizations: Not on file  . Attends Archivist Meetings: Not on file  . Marital Status: Not on file  Intimate Partner Violence:   . Fear of Current  or Ex-Partner: Not on file  . Emotionally Abused: Not on file  . Physically Abused: Not on file  . Sexually Abused: Not on file     PHYSICAL EXAM:  VS: Ht 5\' 10"  (1.778 m)   Wt 180 lb (81.6 kg)   BMI 25.83 kg/m  Physical Exam Gen: NAD, alert, cooperative with exam, well-appearing ENT: normal lips, normal nasal mucosa,  Eye: normal EOM, normal conjunctiva and lids Skin: no rashes, no areas of induration  Neuro: normal tone, normal sensation to touch Psych:  normal insight, alert and oriented MSK:  Left Achilles: No change of the Achilles mid belly. There is a deformity over the base of the calcaneus at the insertion of the Achilles. No redness or streaking. Normal range of motion. No tenderness to palpation over this area. Normal strength resistance. Neurovascularly intact  Limited ultrasound: Left Achilles:  Normal-appearing mid belly Achilles. No retrocalcaneal bursitis. Normal insertion of the Achilles. There appears to be a matrix enlargement.  This is at the insertion.  There is a calcific change.  There is possible for enthesopathy but no significant hyperemia.  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  Appears to be a change of the matrix in this area that has induced this obvious deformity that was seen on exam.  May be gout related with a significant response with the anti-inflammatory. -Continue ibuprofen. -Counseled on supportive care. -May need obtain an updated uric acid. -Could consider heel lifts

## 2019-07-28 NOTE — Patient Instructions (Signed)
Nice to meet you Please take the ibuprofen for 7-10 days  Please try ice if needed   Please send me a message in [place] with any questions or updates.  Please see me back in 4 weeks.   --Dr. Raeford Razor

## 2019-07-29 NOTE — Assessment & Plan Note (Signed)
Does have calcific change of the Achilles at the insertion.  Appears to be a change of the matrix in this area that has induced this obvious deformity that was seen on exam.  May be gout related with a significant response with the anti-inflammatory. -Continue ibuprofen. -Counseled on supportive care. -May need obtain an updated uric acid. -Could consider heel lifts

## 2019-08-04 ENCOUNTER — Encounter: Payer: Medicare Other | Admitting: Family Medicine

## 2019-08-19 ENCOUNTER — Other Ambulatory Visit: Payer: Self-pay

## 2019-08-20 ENCOUNTER — Ambulatory Visit (INDEPENDENT_AMBULATORY_CARE_PROVIDER_SITE_OTHER): Payer: Medicare Other | Admitting: Family Medicine

## 2019-08-20 ENCOUNTER — Encounter: Payer: Self-pay | Admitting: Family Medicine

## 2019-08-20 VITALS — BP 137/91 | HR 86 | Temp 98.2°F | Resp 16 | Ht 70.0 in | Wt 175.0 lb

## 2019-08-20 DIAGNOSIS — I10 Essential (primary) hypertension: Secondary | ICD-10-CM

## 2019-08-20 DIAGNOSIS — Z125 Encounter for screening for malignant neoplasm of prostate: Secondary | ICD-10-CM

## 2019-08-20 DIAGNOSIS — M1A9XX0 Chronic gout, unspecified, without tophus (tophi): Secondary | ICD-10-CM

## 2019-08-20 DIAGNOSIS — E7800 Pure hypercholesterolemia, unspecified: Secondary | ICD-10-CM

## 2019-08-20 DIAGNOSIS — Z23 Encounter for immunization: Secondary | ICD-10-CM

## 2019-08-20 LAB — COMPREHENSIVE METABOLIC PANEL
ALT: 23 U/L (ref 0–53)
AST: 21 U/L (ref 0–37)
Albumin: 4.6 g/dL (ref 3.5–5.2)
Alkaline Phosphatase: 83 U/L (ref 39–117)
BUN: 18 mg/dL (ref 6–23)
CO2: 26 mEq/L (ref 19–32)
Calcium: 9.5 mg/dL (ref 8.4–10.5)
Chloride: 105 mEq/L (ref 96–112)
Creatinine, Ser: 0.95 mg/dL (ref 0.40–1.50)
GFR: 79.21 mL/min (ref >60.00)
Glucose, Bld: 89 mg/dL (ref 70–99)
Potassium: 4.2 mEq/L (ref 3.5–5.1)
Sodium: 140 mEq/L (ref 135–145)
Total Bilirubin: 1.7 mg/dL — ABNORMAL HIGH (ref 0.2–1.2)
Total Protein: 6.5 g/dL (ref 6.0–8.3)

## 2019-08-20 LAB — LDL CHOLESTEROL, DIRECT: Direct LDL: 116 mg/dL

## 2019-08-20 LAB — CBC WITH DIFFERENTIAL/PLATELET
Basophils Absolute: 0.1 10*3/uL (ref 0.0–0.1)
Basophils Relative: 0.6 % (ref 0.0–3.0)
Eosinophils Absolute: 1.1 10*3/uL — ABNORMAL HIGH (ref 0.0–0.7)
Eosinophils Relative: 13.6 % — ABNORMAL HIGH (ref 0.0–5.0)
HCT: 43.6 % (ref 39.0–52.0)
Hemoglobin: 15.3 g/dL (ref 13.0–17.0)
Lymphocytes Relative: 21.8 % (ref 12.0–46.0)
Lymphs Abs: 1.7 10*3/uL (ref 0.7–4.0)
MCHC: 35.1 g/dL (ref 30.0–36.0)
MCV: 97.4 fl (ref 78.0–100.0)
Monocytes Absolute: 0.6 10*3/uL (ref 0.1–1.0)
Monocytes Relative: 7.9 % (ref 3.0–12.0)
Neutro Abs: 4.4 10*3/uL (ref 1.4–7.7)
Neutrophils Relative %: 56.1 % (ref 43.0–77.0)
Platelets: 216 10*3/uL (ref 150.0–400.0)
RBC: 4.48 Mil/uL (ref 4.22–5.81)
RDW: 12.5 % (ref 11.5–15.5)
WBC: 7.9 10*3/uL (ref 4.0–10.5)

## 2019-08-20 LAB — LIPID PANEL
Cholesterol: 207 mg/dL — ABNORMAL HIGH (ref 0–200)
HDL: 55.9 mg/dL (ref >39.00)
NonHDL: 151.49
Total CHOL/HDL Ratio: 4
Triglycerides: 262 mg/dL — ABNORMAL HIGH (ref 0.0–149.0)
VLDL: 52.4 mg/dL — ABNORMAL HIGH (ref 0.0–40.0)

## 2019-08-20 LAB — URIC ACID: Uric Acid, Serum: 7.8 mg/dL (ref 4.0–7.8)

## 2019-08-20 LAB — PSA, MEDICARE: PSA: 0.23 ng/ml (ref 0.10–4.00)

## 2019-08-20 MED ORDER — LISINOPRIL 10 MG PO TABS: 10.0000 mg | ORAL_TABLET | Freq: Every day | ORAL | 0 refills | Status: DC

## 2019-08-20 MED ORDER — PRAVASTATIN SODIUM 80 MG PO TABS: 80.0000 mg | ORAL_TABLET | Freq: Every day | ORAL | 3 refills | Status: DC

## 2019-08-20 NOTE — Addendum Note (Signed)
Addended by: Deveron Furlong D on: 08/20/2019 10:13 AM   Modules accepted: Orders

## 2019-08-20 NOTE — Progress Notes (Addendum)
OFFICE VISIT  08/20/2019   CC:  Chief Complaint  Patient presents with  . Follow-up    RCI, pt is fasting   HPI:    Patient is a 69 y.o. Caucasian male who presents for 6 mo f/u insomnia, GAD, and, HLD. He does have hx of HTN listed in [place] but is not on any antihypertensive med.  A/P as of last visit: "1) GAD: stable on scheduled 2-4 doses of alprazolam per day. UDS today. CSC UTD.  2) Insomnia/high risk med use-->CSC UTD.  UDS obtained today.  3) HLD: tolerating statin. No blood work indicated today.  4) Elevated bp w/out dx of HTN:  Instructions: Start checking blood pressure and heart 3 times a week. If top number consistently > 140 or bottom number >90 then call or return. DASH diet was given today.  4) Preventative health: all UTD."  Interim hx: Some home bp checks since last visit: sounds like [id number] systolics. No info on diastolics.  HR 80s. Has bp cuff 67 yrs old and he wonders about accuracy but sounds ok.  Insomnia: ambien long term is helpful.  Anxiety: alprazolam long term is helpful.   PMP AWARE reviewed today: most recent rx for alprazolam 0.5mg  was filled 08/18/19, # 120, rx by me. Most recent ambien ER 12.5mg  filled 08/03/19, #30, rx by me. No red flags.  Past Medical History:  Diagnosis Date  . Anxiety   . BPPV (benign paroxysmal positional vertigo) 06/2014  . Chronic insomnia   . Chronic lumbar pain    DDD; ibuprofen, occasional muscle relaxers.  MRI L-spine 2007 showed some bulging discs with nerve impingement.  Has never had back injection or surgery.  . Contact dermatitis and eczema   . Diverticulitis   . Genital herpes 08/2015   HSV type I on culture  . Gilbert syndrome   . Gout    "always right big toe"--about one episode every 4 yrs.  Right ankle flare, 02/2016.  Marland Kitchen History of adenomatous polyp of colon 2005  . History of bronchitis 08/11/2012  . History of Clostridium difficile colitis 2016  . HTN (hypertension)   . Hyperlipidemia,  mixed    Statin started 04/2018  . PUD (peptic ulcer disease)     Past Surgical History:  Procedure Laterality Date  . COLONOSCOPY  07/2013   Normal.  Recall 10 yrs.  . COLONOSCOPY W/ POLYPECTOMY  2005   Dr. Lyla Son    Outpatient Medications Prior to Visit  Medication Sig Dispense Refill  . ALPRAZolam (XANAX) 0.5 MG tablet TAKE 1 TABLET BY MOUTH FOUR TIMES DAILY AS NEEDED FOR STRESS. 120 tablet 5  . aspirin 81 MG tablet Take 81 mg by mouth daily.      . Colchicine (MITIGARE) 0.6 MG CAPS 2 caps po at onset of gout flare, then take another 1 cap 1 hour later 30 capsule 2  . fluticasone (FLONASE) 50 MCG/ACT nasal spray instill 2 sprays into each nostril once daily if needed for allergies 16 g 5  . ibuprofen (ADVIL,MOTRIN) 800 MG tablet TAKE 1 TABLET BY MOUTH THREE TIMES DAILY WITH FOOD IF NEEDED FOR PAIN 90 tablet 5  . pantoprazole (PROTONIX) 40 MG tablet TAKE 1 TABLET BY MOUTH EVERY MORNING 90 tablet 1  . saccharomyces boulardii (FLORASTOR) 250 MG capsule Take 1 capsules twice daily. 60 capsule 3  . zolpidem (AMBIEN CR) 12.5 MG CR tablet TAKE 1 TABLET BY MOUTH AT BEDTIME AS NEEDED 30 tablet 5  . pravastatin (PRAVACHOL) 80  MG tablet Take 1 tablet (80 mg total) by mouth daily. 90 tablet 3  . fluticasone (CUTIVATE) 0.05 % cream Apply topically 2 (two) times daily. (Patient not taking: Reported on 08/20/2019) 30 g 3  . Multiple Vitamin (MULTIVITAMIN) tablet Take 1 tablet by mouth daily.       No facility-administered medications prior to visit.    Allergies  Allergen Reactions  . Prednisone Other (See Comments)    Insomnia, elevated BP and HR  . Augmentin [Amoxicillin-Pot Clavulanate] Diarrhea    Upset stomach    ROS As per HPI  PE: Blood pressure (!) 137/91, pulse 86, temperature 98.2 F (36.8 C), temperature source Temporal, resp. rate 16, height 5\' 10"  (1.778 m), weight 175 lb (79.4 kg), SpO2 96 %. Body mass index is 25.11 kg/m. Gen: Alert, well appearing.  Patient is  oriented to person, place, time, and situation. AFFECT: pleasant, lucid thought and speech. Neck - No masses or thyromegaly or limitation in range of motion.  No bruits.  Carotids 2+ bilat. CV: RRR, no m/r/g.   LUNGS: CTA bilat, nonlabored resps, good aeration in all lung fields. ABD: soft, NT, ND, BS normal.  No hepatospenomegaly or mass.  No bruits. EXT: no clubbing or cyanosis.  no edema.   RECTAL: he declined  LABS:  Lab Results  Component Value Date   TSH 3.74 04/22/2018   Lab Results  Component Value Date   WBC  04/22/2018    9.3 A Manual Differential was performed and is consistent with the Automated Differential.   HGB 15.7 04/22/2018   HCT 43.8 04/22/2018   MCV 97.2 04/22/2018   PLT 234.0 04/22/2018   Lab Results  Component Value Date   CREATININE 0.93 04/22/2018   BUN 15 04/22/2018   NA 139 04/22/2018   K 4.3 04/22/2018   CL 106 04/22/2018   CO2 26 04/22/2018   Lab Results  Component Value Date   ALT 23 04/22/2018   AST 20 04/22/2018   ALKPHOS 80 04/22/2018   BILITOT 1.4 (H) 04/22/2018   Lab Results  Component Value Date   CHOL 180 10/21/2018   Lab Results  Component Value Date   HDL 44.10 10/21/2018   No results found for: [facility] Lab Results  Component Value Date   TRIG 221.0 (H) 10/21/2018   Lab Results  Component Value Date   CHOLHDL 4 10/21/2018   Lab Results  Component Value Date   PSA 0.56 04/22/2018   PSA 0.41 04/19/2017   PSA 0.47 03/30/2016   IMPRESSION AND PLAN:  1) HTN: get him started on med: lisinopril 10mg  qd. Therapeutic expectations and side effect profile of medication discussed today.  Patient's questions answered. BMET today, CBC today. Lab visit for BMET 1 wk, f/u in person recheck HTN 2 wks.  2) HLD: tolerating statin. FLP and hepatic panel today.  3) Chronic anxiety: well controlled with use of xanax. Update CSC today.  Will do UDS in 6 mo.  4) Insomnia: stable on ambien CR 12.5mg  qhs. CSC updated.  5)  Preventative health: pt declined flu vaccine.  Pneumovax 23 given today. Prostate ca screening: he declined the DRE today but does want PSA--->ordered. Colon ca screening: recall 2025.  An After Visit Summary was printed and given to the patient.  FOLLOW UP: Return for 1 week lab visit for BMET (ordered) and 2 wk in person f/u HTN.  Signed:  Crissie Sickles, MD           08/20/2019

## 2019-08-21 ENCOUNTER — Encounter: Payer: Self-pay | Admitting: Family Medicine

## 2019-08-25 ENCOUNTER — Ambulatory Visit: Payer: Medicare Other | Admitting: Family Medicine

## 2019-08-27 ENCOUNTER — Ambulatory Visit (INDEPENDENT_AMBULATORY_CARE_PROVIDER_SITE_OTHER): Payer: Medicare Other | Admitting: Family Medicine

## 2019-08-27 ENCOUNTER — Other Ambulatory Visit: Payer: Self-pay

## 2019-08-27 DIAGNOSIS — I10 Essential (primary) hypertension: Secondary | ICD-10-CM

## 2019-08-28 LAB — BASIC METABOLIC PANEL
BUN: 17 mg/dL (ref 6–23)
CO2: 30 mEq/L (ref 19–32)
Calcium: 9.4 mg/dL (ref 8.4–10.5)
Chloride: 102 mEq/L (ref 96–112)
Creatinine, Ser: 0.99 mg/dL (ref 0.40–1.50)
GFR: 75.52 mL/min (ref >60.00)
Glucose, Bld: 130 mg/dL — ABNORMAL HIGH (ref 70–99)
Potassium: 4.1 mEq/L (ref 3.5–5.1)
Sodium: 138 mEq/L (ref 135–145)

## 2019-09-01 ENCOUNTER — Ambulatory Visit (INDEPENDENT_AMBULATORY_CARE_PROVIDER_SITE_OTHER): Payer: Medicare Other | Admitting: Family Medicine

## 2019-09-01 ENCOUNTER — Other Ambulatory Visit: Payer: Self-pay

## 2019-09-01 ENCOUNTER — Encounter: Payer: Self-pay | Admitting: Family Medicine

## 2019-09-01 VITALS — BP 126/84 | HR 92 | Temp 97.9°F | Resp 16 | Ht 70.0 in | Wt 180.2 lb

## 2019-09-01 DIAGNOSIS — I10 Essential (primary) hypertension: Secondary | ICD-10-CM

## 2019-09-01 NOTE — Progress Notes (Signed)
OFFICE VISIT  09/01/2019   CC:  Chief Complaint  Patient presents with  . Follow-up    hypertension, 2 week   HPI:    Patient is a 69 y.o. Caucasian male who presents for 2 wk f/u HTN. A/P as of last visit: "HTN: get him started on med: lisinopril 10mg  qd. Therapeutic expectations and side effect profile of medication discussed today.  Patient's questions answered. BMET today, CBC today. Lab visit for BMET 1 wk, f/u in person recheck HTN 2 wks."  Interim hx: His BMET was stable after starting lisinopril. Home bp's avg 125/upper 70s, HR 84-105. He feels fine, no side effects from med.   Past Medical History:  Diagnosis Date  . Anxiety   . BPPV (benign paroxysmal positional vertigo) 06/2014  . Chronic insomnia   . Chronic lumbar pain    DDD; ibuprofen, occasional muscle relaxers.  MRI L-spine 2007 showed some bulging discs with nerve impingement.  Has never had back injection or surgery.  . Contact dermatitis and eczema   . Diverticulitis   . Genital herpes 08/2015   HSV type I on culture  . Gilbert syndrome   . Gout    "always right big toe"--about one episode every 4 yrs.  Right ankle flare, 02/2016.  Marland Kitchen History of adenomatous polyp of colon 2005  . History of bronchitis 08/11/2012  . History of Clostridium difficile colitis 2016  . HTN (hypertension)    started lisinopril 08/20/19  . Hyperlipidemia, mixed    Statin started 04/2018  . PUD (peptic ulcer disease)     Past Surgical History:  Procedure Laterality Date  . COLONOSCOPY  07/2013   Normal.  Recall 10 yrs.  . COLONOSCOPY W/ POLYPECTOMY  2005   Dr. Lyla Son    Outpatient Medications Prior to Visit  Medication Sig Dispense Refill  . ALPRAZolam (XANAX) 0.5 MG tablet TAKE 1 TABLET BY MOUTH FOUR TIMES DAILY AS NEEDED FOR STRESS. 120 tablet 5  . aspirin 81 MG tablet Take 81 mg by mouth daily.      . Colchicine (MITIGARE) 0.6 MG CAPS 2 caps po at onset of gout flare, then take another 1 cap 1 hour later 30  capsule 2  . fluticasone (FLONASE) 50 MCG/ACT nasal spray instill 2 sprays into each nostril once daily if needed for allergies 16 g 5  . ibuprofen (ADVIL,MOTRIN) 800 MG tablet TAKE 1 TABLET BY MOUTH THREE TIMES DAILY WITH FOOD IF NEEDED FOR PAIN 90 tablet 5  . lisinopril (ZESTRIL) 10 MG tablet Take 1 tablet (10 mg total) by mouth daily. 30 tablet 0  . Multiple Vitamin (MULTIVITAMIN) tablet Take 1 tablet by mouth daily.      . pantoprazole (PROTONIX) 40 MG tablet TAKE 1 TABLET BY MOUTH EVERY MORNING 90 tablet 1  . pravastatin (PRAVACHOL) 80 MG tablet Take 1 tablet (80 mg total) by mouth daily. 90 tablet 3  . saccharomyces boulardii (FLORASTOR) 250 MG capsule Take 1 capsules twice daily. 60 capsule 3  . zolpidem (AMBIEN CR) 12.5 MG CR tablet TAKE 1 TABLET BY MOUTH AT BEDTIME AS NEEDED 30 tablet 5  . fluticasone (CUTIVATE) 0.05 % cream Apply topically 2 (two) times daily. (Patient not taking: Reported on 08/20/2019) 30 g 3   No facility-administered medications prior to visit.    Allergies  Allergen Reactions  . Prednisone Other (See Comments)    Insomnia, elevated BP and HR  . Augmentin [Amoxicillin-Pot Clavulanate] Diarrhea    Upset stomach  ROS As per HPI  PE: Blood pressure 126/84, pulse 92, temperature 97.9 F (36.6 C), temperature source Temporal, resp. rate 16, height 5\' 10"  (1.778 m), weight 180 lb 3.2 oz (81.7 kg), SpO2 100 %. Gen: Alert, well appearing.  Patient is oriented to person, place, time, and situation. AFFECT: pleasant, lucid thought and speech. CV: RRR, no m/r/g.   LUNGS: CTA bilat, nonlabored resps, good aeration in all lung fields. EXT: no clubbing or cyanosis.  no edema.    LABS:    Chemistry      Component Value Date/Time   NA 138 08/27/2019 1500   K 4.1 08/27/2019 1500   CL 102 08/27/2019 1500   CO2 30 08/27/2019 1500   BUN 17 08/27/2019 1500   CREATININE 0.99 08/27/2019 1500      Component Value Date/Time   CALCIUM 9.4 08/27/2019 1500   ALKPHOS  83 08/20/2019 0958   AST 21 08/20/2019 0958   ALT 23 08/20/2019 0958   BILITOT 1.7 (H) 08/20/2019 0958     Lab Results  Component Value Date   CHOL 207 (H) 08/20/2019   HDL 55.90 08/20/2019   LDLDIRECT 116.0 08/20/2019   TRIG 262.0 (H) 08/20/2019   CHOLHDL 4 08/20/2019    IMPRESSION AND PLAN:  HTN, well controlled on lisinopril 10mg  qd.  An After Visit Summary was printed and given to the patient.  FOLLOW UP: Return in about 6 months (around 02/29/2020) for routine chronic illness f/u.  Signed:  Crissie Sickles, MD           09/01/2019

## 2019-09-15 ENCOUNTER — Other Ambulatory Visit: Payer: Self-pay

## 2019-09-15 MED ORDER — LISINOPRIL 10 MG PO TABS: 10.0000 mg | ORAL_TABLET | Freq: Every day | ORAL | 1 refills | Status: DC

## 2019-09-30 ENCOUNTER — Encounter: Payer: Self-pay | Admitting: Family Medicine

## 2019-10-16 ENCOUNTER — Other Ambulatory Visit: Payer: Self-pay

## 2019-10-16 MED ORDER — IBUPROFEN 800 MG PO TABS: ORAL_TABLET | ORAL | 0 refills | Status: DC

## 2019-11-03 ENCOUNTER — Other Ambulatory Visit: Payer: Self-pay | Admitting: Family Medicine

## 2019-11-05 NOTE — Telephone Encounter (Signed)
RF request for ambien. Last OV 09/01/19 Next OV 03/09/20 Last RX 05/05/2019 # 30x 5 rfs.  Please advise.

## 2019-11-17 ENCOUNTER — Other Ambulatory Visit: Payer: Self-pay

## 2019-11-17 MED ORDER — ALPRAZOLAM 0.5 MG PO TABS: ORAL_TABLET | ORAL | 5 refills | Status: DC

## 2019-11-17 NOTE — Telephone Encounter (Signed)
Requesting:alprazplam Contract:08/20/19 UDS:02/21/19 Last Visit:09/01/19 Next Visit:03/11/20 Last Refill:05/20/19(120,5)  Please Advise. Medication pending

## 2019-11-28 DIAGNOSIS — Z23 Encounter for immunization: Secondary | ICD-10-CM | POA: Diagnosis not present

## 2019-12-01 ENCOUNTER — Other Ambulatory Visit: Payer: Self-pay

## 2019-12-01 MED ORDER — IBUPROFEN 800 MG PO TABS: ORAL_TABLET | ORAL | 1 refills | Status: DC

## 2019-12-01 NOTE — Telephone Encounter (Signed)
RF request for Ibuprofen LOV:09/01/19 Next ov: 03/11/20 Last written:10/16/19(90,0)  Medication pending, please advise.

## 2019-12-19 DIAGNOSIS — Z23 Encounter for immunization: Secondary | ICD-10-CM | POA: Diagnosis not present

## 2020-01-14 ENCOUNTER — Other Ambulatory Visit: Payer: Self-pay | Admitting: Family Medicine

## 2020-02-13 ENCOUNTER — Other Ambulatory Visit: Payer: Self-pay

## 2020-02-13 MED ORDER — IBUPROFEN 800 MG PO TABS: ORAL_TABLET | ORAL | 2 refills | Status: DC

## 2020-02-13 NOTE — Telephone Encounter (Signed)
RF request for Ibuprofen LOV:09/01/19 Next ov: 03/11/20 Last written:12/01/19(90,1)  Please advise. Medication pending

## 2020-02-26 ENCOUNTER — Telehealth (INDEPENDENT_AMBULATORY_CARE_PROVIDER_SITE_OTHER): Payer: Medicare Other | Admitting: Family Medicine

## 2020-02-26 ENCOUNTER — Encounter: Payer: Self-pay | Admitting: Family Medicine

## 2020-02-26 VITALS — Temp 97.5°F

## 2020-02-26 DIAGNOSIS — J310 Chronic rhinitis: Secondary | ICD-10-CM

## 2020-02-26 DIAGNOSIS — J329 Chronic sinusitis, unspecified: Secondary | ICD-10-CM | POA: Diagnosis not present

## 2020-02-26 NOTE — Progress Notes (Signed)
Virtual Visit via Video Note  I connected with Keith Sanders  on 02/26/20 at 10:40 AM EDT by a video enabled telemedicine application and verified that I am speaking with the correct person using two identifiers.  Location patient: home, [place] Location provider:work or home office Persons participating in the virtual visit: patient, provider  I discussed the limitations of evaluation and management by telemedicine and the availability of in person appointments. The patient expressed understanding and agreed to proceed.   HPI:  Acute visit for "sinus Infection" -started 4 days ago -symptoms include sinus congestion, sinus pressure, some green nasal mucus, cough -denies: fever, SOB, vomiting, diarrhea, body aches, loss of taste or smell, malaise -grandson had a birthday party Saturday and he was sick with a resp illness -takes flonase daily for allergies but denies any baseline or prior nasal congestion before this started -fully vaccinated for covid   ROS: See pertinent positives and negatives per HPI.  Past Medical History:  Diagnosis Date  . Anxiety   . BPPV (benign paroxysmal positional vertigo) 06/2014  . 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 Chronic insomnia   . Chronic lumbar pain    DDD; ibuprofen, occasional muscle relaxers.  MRI L-spine 2007 showed some bulging discs with nerve impingement.  Has never had back injection or surgery.  . Contact dermatitis and eczema   . Diverticulitis   . Genital herpes 08/2015   HSV type I on culture  . Gilbert syndrome   . Gout    "always right big toe"--about one episode every 4 yrs.  Right ankle flare, 02/2016.  Marland Kitchen History of adenomatous polyp of colon 2005  . History of bronchitis 08/11/2012  . History of Clostridium difficile colitis 2016  . HTN (hypertension)    started lisinopril 08/20/19  . Hyperlipidemia, mixed    Statin started 04/2018  . PUD (peptic ulcer disease)     Past Surgical History:  Procedure Laterality  Date  . COLONOSCOPY  07/2013   Normal.  Recall 10 yrs.  . COLONOSCOPY W/ POLYPECTOMY  2005   Dr. Lyla Son    Family History  Problem Relation Age of Onset  . Heart attack Father 5  . Heart disease Father        MI age 82  . Heart disease Other   . Colon polyps Mother   . Ovarian cancer Sister   . Colon cancer Neg Hx     SOCIAL HX: see hpi   Current Outpatient Medications:  .  ALPRAZolam (XANAX) 0.5 MG tablet, TAKE 1 TABLET BY MOUTH FOUR TIMES DAILY AS NEEDED FOR STRESS., Disp: 120 tablet, Rfl: 5 .  aspirin 81 MG tablet, Take 81 mg by mouth daily.  , Disp: , Rfl:  .  Colchicine (MITIGARE) 0.6 MG CAPS, 2 caps po at onset of gout flare, then take another 1 cap 1 hour later, Disp: 30 capsule, Rfl: 2 .  fluticasone (CUTIVATE) 0.05 % cream, Apply topically 2 (two) times daily., Disp: 30 g, Rfl: 3 .  fluticasone (FLONASE) 50 MCG/ACT nasal spray, instill 2 sprays into each nostril once daily if needed for allergies, Disp: 16 g, Rfl: 5 .  ibuprofen (ADVIL) 800 MG tablet, TAKE 1 TABLET BY MOUTH THREE TIMES DAILY WITH FOOD IF NEEDED FOR PAIN, Disp: 90 tablet, Rfl: 2 .  lisinopril (ZESTRIL) 10 MG tablet, Take 1 tablet (10 mg total) by mouth daily., Disp: 90 tablet, Rfl: 1 .  Multiple Vitamin (MULTIVITAMIN) tablet, Take 1 tablet by  mouth daily.  , Disp: , Rfl:  .  pantoprazole (PROTONIX) 40 MG tablet, TAKE 1 TABLET BY MOUTH EVERY MORNING, Disp: 90 tablet, Rfl: 1 .  pravastatin (PRAVACHOL) 80 MG tablet, Take 1 tablet (80 mg total) by mouth daily., Disp: 90 tablet, Rfl: 3 .  saccharomyces boulardii (FLORASTOR) 250 MG capsule, Take 1 capsules twice daily., Disp: 60 capsule, Rfl: 3 .  zolpidem (AMBIEN CR) 12.5 MG CR tablet, TAKE 1 TABLET BY MOUTH AT BEDTIME AS NEEDED, Disp: 30 tablet, Rfl: 5  EXAM:  VITALS per patient if applicable: denies fever  GENERAL: alert, oriented, appears well and in no acute distress  HEENT: atraumatic, conjunttiva clear, no obvious abnormalities on inspection of  external nose and ears  NECK: normal movements of the head and neck  LUNGS: on inspection no signs of respiratory distress, breathing rate appears normal, no obvious gross SOB, gasping or wheezing  CV: no obvious cyanosis  MS: moves all visible extremities without noticeable abnormality  PSYCH/NEURO: pleasant and cooperative, no obvious depression or anxiety, speech and thought processing grossly intact  ASSESSMENT AND PLAN:  Discussed the following assessment and plan:  Rhinosinusitis  -we discussed possible serious and likely etiologies, options for evaluation and workup, limitations of telemedicine visit vs in person visit, treatment, treatment risks and precautions. Pt prefers to treat via telemedicine empirically rather then risking or undertaking an in person visit at this moment. Suspect viral or allergic etiology most likely vs other. Discussed possibility of COVID19 given rise in breakthrough cases with the delta variant and he is around a lot of people. Opted for nasal saline, short 3 day course of afrin, allegra  advised to seek prompt follow up or in person care if worsening, new symptoms arise, or if is not improving with treatment. Discussed COVID19 testing options and limitations, stay home while sick and per CDC if positive.    I discussed the assessment and treatment plan with the patient. The patient was provided an opportunity to ask questions and all were answered. The patient agreed with the plan and demonstrated an understanding of the instructions.   The patient was advised to call back or seek an in-person evaluation if the symptoms worsen or if the condition fails to improve as anticipated.   Lucretia Kern, DO

## 2020-02-26 NOTE — Patient Instructions (Addendum)
-  nasal saline twice per day  -Afrin nasal spray  -aleve or tylenol if needed  -COVID test - let PCP office know immediately if positive as there is outpatient treatment available. Please stay home for a full 10 days and until feeling better if positive test.  I hope you are feeling better soon! Seek care promptly if your symptoms worsen, new concerns arise or you are not improving with treatment over the next several days.

## 2020-02-29 DIAGNOSIS — J019 Acute sinusitis, unspecified: Secondary | ICD-10-CM | POA: Diagnosis not present

## 2020-02-29 DIAGNOSIS — Z20828 Contact with and (suspected) exposure to other viral communicable diseases: Secondary | ICD-10-CM | POA: Diagnosis not present

## 2020-03-02 ENCOUNTER — Ambulatory Visit: Payer: Medicare Other | Admitting: Family Medicine

## 2020-03-06 ENCOUNTER — Other Ambulatory Visit: Payer: Self-pay | Admitting: Family Medicine

## 2020-03-08 ENCOUNTER — Other Ambulatory Visit: Payer: Self-pay

## 2020-03-11 ENCOUNTER — Ambulatory Visit (INDEPENDENT_AMBULATORY_CARE_PROVIDER_SITE_OTHER): Payer: Medicare Other | Admitting: Family Medicine

## 2020-03-11 ENCOUNTER — Encounter: Payer: Self-pay | Admitting: Family Medicine

## 2020-03-11 ENCOUNTER — Other Ambulatory Visit: Payer: Self-pay

## 2020-03-11 VITALS — BP 136/88 | HR 77 | Temp 98.1°F | Resp 16 | Ht 70.0 in | Wt 172.0 lb

## 2020-03-11 DIAGNOSIS — E782 Mixed hyperlipidemia: Secondary | ICD-10-CM | POA: Diagnosis not present

## 2020-03-11 DIAGNOSIS — F411 Generalized anxiety disorder: Secondary | ICD-10-CM | POA: Diagnosis not present

## 2020-03-11 DIAGNOSIS — I10 Essential (primary) hypertension: Secondary | ICD-10-CM | POA: Diagnosis not present

## 2020-03-11 DIAGNOSIS — F5101 Primary insomnia: Secondary | ICD-10-CM | POA: Diagnosis not present

## 2020-03-11 LAB — BASIC METABOLIC PANEL
BUN: 17 mg/dL (ref 6–23)
CO2: 26 mEq/L (ref 19–32)
Calcium: 9.3 mg/dL (ref 8.4–10.5)
Chloride: 103 mEq/L (ref 96–112)
Creatinine, Ser: 1.01 mg/dL (ref 0.40–1.50)
GFR: 73.68 mL/min (ref >60.00)
Glucose, Bld: 85 mg/dL (ref 70–99)
Potassium: 4.2 mEq/L (ref 3.5–5.1)
Sodium: 137 mEq/L (ref 135–145)

## 2020-03-11 LAB — LIPID PANEL
Cholesterol: 194 mg/dL (ref 0–200)
HDL: 51.4 mg/dL (ref >39.00)
NonHDL: 142.3
Total CHOL/HDL Ratio: 4
Triglycerides: 280 mg/dL — ABNORMAL HIGH (ref 0.0–149.0)
VLDL: 56 mg/dL — ABNORMAL HIGH (ref 0.0–40.0)

## 2020-03-11 LAB — LDL CHOLESTEROL, DIRECT: Direct LDL: 105 mg/dL

## 2020-03-11 NOTE — Telephone Encounter (Signed)
Patient was just seen for o/v today but forgot to mention needing RF for ketoconazole shampoo for scalp. Last refill was 03/06/13 for 136mL w/ 2 refills. No longer on current med list  Please advise if refill would be appropriate, thanks.

## 2020-03-11 NOTE — Progress Notes (Signed)
OFFICE VISIT  03/11/2020   CC:  Chief Complaint  Patient presents with  . Follow-up    RCI, pt is fasting   HPI:    Patient is a 69 y.o. Caucasian male who presents for f/u HTN, HLD, chronic insomnia, GAD.   Had acute sinusitis about 2 wks ago, was treated with amoxil and sx's resolved. Covid 19 testing negative.    HTN: 128/78 at UC recently. Home bps typically low 130s/low 80s. Has not taken his meds yet today.  HLD: tolerating statin. Overall his diet is good.  Has chronic anxiety that has been well controlled with scheduled use of alprazolam long term. Also has chronic insomnia, has responded well to nightly use of ambien. PMP AWARE reviewed today: most recent rx for Lorrin Mais was filled 03/03/20, # 30, rx by me. Most recent alprazolam rx filled 02/16/20, #120, rx by me. No red flags.  ROS: no fevers, no CP, no SOB, no wheezing, no cough, no dizziness, no HAs, no rashes, no melena/hematochezia.  No polyuria or polydipsia.  No myalgias or arthralgias.  No focal weakness, paresthesias, or tremors.  No acute vision or hearing abnormalities. No n/v/d or abd pain.  No palpitations.     Past Medical History:  Diagnosis Date  . Anxiety   . BPPV (benign paroxysmal positional vertigo) 06/2014  . 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 Chronic insomnia   . Chronic lumbar pain    DDD; ibuprofen, occasional muscle relaxers.  MRI L-spine 2007 showed some bulging discs with nerve impingement.  Has never had back injection or surgery.  . Contact dermatitis and eczema   . Diverticulitis   . Genital herpes 08/2015   HSV type I on culture  . Gilbert syndrome   . Gout    "always right big toe"--about one episode every 4 yrs.  Right ankle flare, 02/2016.  Marland Kitchen History of adenomatous polyp of colon 2005  . History of bronchitis 08/11/2012  . History of Clostridium difficile colitis 2016  . HTN (hypertension)    started lisinopril 08/20/19  . Hyperlipidemia, mixed    Statin  started 04/2018  . PUD (peptic ulcer disease)     Past Surgical History:  Procedure Laterality Date  . COLONOSCOPY  07/2013   Normal.  Recall 10 yrs.  . COLONOSCOPY W/ POLYPECTOMY  2005   Dr. Lyla Son    Outpatient Medications Prior to Visit  Medication Sig Dispense Refill  . ALPRAZolam (XANAX) 0.5 MG tablet TAKE 1 TABLET BY MOUTH FOUR TIMES DAILY AS NEEDED FOR STRESS. 120 tablet 5  . Colchicine (MITIGARE) 0.6 MG CAPS 2 caps po at onset of gout flare, then take another 1 cap 1 hour later 30 capsule 2  . fluticasone (CUTIVATE) 0.05 % cream Apply topically 2 (two) times daily. 30 g 3  . fluticasone (FLONASE) 50 MCG/ACT nasal spray instill 2 sprays into each nostril once daily if needed for allergies 16 g 5  . ibuprofen (ADVIL) 800 MG tablet TAKE 1 TABLET BY MOUTH THREE TIMES DAILY WITH FOOD IF NEEDED FOR PAIN 90 tablet 2  . lisinopril (ZESTRIL) 10 MG tablet TAKE 1 TABLET(10 MG) BY MOUTH DAILY 90 tablet 1  . Multiple Vitamin (MULTIVITAMIN) tablet Take 1 tablet by mouth daily.      . pantoprazole (PROTONIX) 40 MG tablet TAKE 1 TABLET BY MOUTH EVERY MORNING 90 tablet 1  . pravastatin (PRAVACHOL) 80 MG tablet Take 1 tablet (80 mg total) by mouth daily. 90 tablet  3  . saccharomyces boulardii (FLORASTOR) 250 MG capsule Take 1 capsules twice daily. 60 capsule 3  . zolpidem (AMBIEN CR) 12.5 MG CR tablet TAKE 1 TABLET BY MOUTH AT BEDTIME AS NEEDED 30 tablet 5  . amoxicillin (AMOXIL) 500 MG tablet Take 500 mg by mouth 3 (three) times daily. (Patient not taking: Reported on 03/11/2020)    . aspirin 81 MG tablet Take 81 mg by mouth daily.   (Patient not taking: Reported on 03/11/2020)     No facility-administered medications prior to visit.    Allergies  Allergen Reactions  . Prednisone Other (See Comments)    Insomnia, elevated BP and HR  . Augmentin [Amoxicillin-Pot Clavulanate] Diarrhea    Upset stomach    ROS As per HPI  PE: Vitals with BMI 03/11/2020 09/01/2019 08/20/2019  Height 5'  10" 5\' 10"  5\' 10"   Weight 172 lbs 180 lbs 3 oz 175 lbs  BMI 24.68 33.54 56.25  Systolic 638 937 342  Diastolic 88 84 91  Pulse 77 92 86  O2 sat on RA today is 97%  Gen: Alert, well appearing.  Patient is oriented to person, place, time, and situation. AFFECT: pleasant, lucid thought and speech. CV: RRR, no m/r/g.   LUNGS: CTA bilat, nonlabored resps, good aeration in all lung fields. EXT: no clubbing or cyanosis.  no edema.    LABS:  Lab Results  Component Value Date   TSH 3.74 04/22/2018   Lab Results  Component Value Date   WBC 7.9 08/20/2019   HGB 15.3 08/20/2019   HCT 43.6 08/20/2019   MCV 97.4 08/20/2019   PLT 216.0 08/20/2019   Lab Results  Component Value Date   CREATININE 0.99 08/27/2019   BUN 17 08/27/2019   NA 138 08/27/2019   K 4.1 08/27/2019   CL 102 08/27/2019   CO2 30 08/27/2019   Lab Results  Component Value Date   ALT 23 08/20/2019   AST 21 08/20/2019   ALKPHOS 83 08/20/2019   BILITOT 1.7 (H) 08/20/2019   Lab Results  Component Value Date   CHOL 207 (H) 08/20/2019   Lab Results  Component Value Date   HDL 55.90 08/20/2019   No results found for: [facility] Lab Results  Component Value Date   TRIG 262.0 (H) 08/20/2019   Lab Results  Component Value Date   CHOLHDL 4 08/20/2019   Lab Results  Component Value Date   PSA 0.23 08/20/2019   PSA 0.56 04/22/2018   PSA 0.41 04/19/2017    IMPRESSION AND PLAN:  1) HTN: The current medical regimen is effective;  continue present plan and medications. Lytes/cr today.  2) HLD: tolerating statin. FLP today.  3) Insomnia: doing well long term on ambien. No new rx was needed today. CSC UTD.  4) GAD: doing well long term on alprazolam. No new rx was needed today. CSC UTD.    An After Visit Summary was printed and given to the patient.  FOLLOW UP: Return in about 6 months (around 09/11/2020) for routine chronic illness f/u.   Signed:  Crissie Sickles, MD           03/11/2020

## 2020-03-12 MED ORDER — KETOCONAZOLE 2 % EX SHAM: MEDICATED_SHAMPOO | CUTANEOUS | 11 refills | Status: DC

## 2020-03-12 NOTE — Telephone Encounter (Signed)
Ketoconazole eRx'd

## 2020-05-01 ENCOUNTER — Other Ambulatory Visit: Payer: Self-pay | Admitting: Family Medicine

## 2020-05-03 NOTE — Telephone Encounter (Signed)
RequestingLorrin Mais Contract: 09/01/19 UDS:02/2019 Last Visit:03/11/20 Next Visit:09/13/20 Last Refill:11/05/19  Please Advise

## 2020-05-03 NOTE — Telephone Encounter (Signed)
Patient is leaving to go out of town tomorrow morning.  He said he called over the weekend. He said he will be out of meds. He is sorry for calling at last minute.   I advised him for future refills (1) contact pharmacy first - if pharmacy does not allow for him to call them first, then he will be directed to call us for authorization (2) allow 3 days for refill approvals (3) cannot request refills after 5pm Mon-Fri, weekends and/or holidays. He understood.

## 2020-05-04 NOTE — Telephone Encounter (Signed)
Patient made aware refill sent.  

## 2020-05-13 ENCOUNTER — Other Ambulatory Visit: Payer: Self-pay | Admitting: Family Medicine

## 2020-05-18 ENCOUNTER — Other Ambulatory Visit: Payer: Self-pay | Admitting: Family Medicine

## 2020-05-18 NOTE — Telephone Encounter (Signed)
Requesting: alprazolam Contract:08/20/19 UDS:02/21/19 Last Visit:02/20/20 Next Visit:09/13/20 Last Refill:11/17/19 (120,5)  Please Advise. Medication pending

## 2020-06-06 DIAGNOSIS — J019 Acute sinusitis, unspecified: Secondary | ICD-10-CM | POA: Diagnosis not present

## 2020-06-06 DIAGNOSIS — H66001 Acute suppurative otitis media without spontaneous rupture of ear drum, right ear: Secondary | ICD-10-CM | POA: Diagnosis not present

## 2020-06-06 DIAGNOSIS — H6502 Acute serous otitis media, left ear: Secondary | ICD-10-CM | POA: Diagnosis not present

## 2020-06-24 ENCOUNTER — Telehealth: Payer: Self-pay | Admitting: Family Medicine

## 2020-06-24 NOTE — Telephone Encounter (Signed)
Attempted to schedule AWV. Unable to LVM.  Will try at later time.   Called patient to schedule Annual Wellness Visit.  Please schedule with Nurse Health Advisor Martha Stanley, RN at [facility]  

## 2020-06-25 DIAGNOSIS — H66009 Acute suppurative otitis media without spontaneous rupture of ear drum, unspecified ear: Secondary | ICD-10-CM | POA: Diagnosis not present

## 2020-07-12 ENCOUNTER — Other Ambulatory Visit: Payer: Self-pay | Admitting: Family Medicine

## 2020-07-17 DIAGNOSIS — U071 COVID-19: Secondary | ICD-10-CM

## 2020-07-17 HISTORY — DX: COVID-19: U07.1

## 2020-07-20 ENCOUNTER — Encounter: Payer: Self-pay | Admitting: Family Medicine

## 2020-07-20 DIAGNOSIS — H6980 Other specified disorders of Eustachian tube, unspecified ear: Secondary | ICD-10-CM

## 2020-07-20 NOTE — Telephone Encounter (Signed)
Please advise, pt needing referral for continued issue?

## 2020-07-21 NOTE — Telephone Encounter (Signed)
Sounds like it would be best to see ENT. Referral ordered.

## 2020-07-24 ENCOUNTER — Other Ambulatory Visit: Payer: Self-pay | Admitting: Family Medicine

## 2020-07-26 ENCOUNTER — Other Ambulatory Visit: Payer: Self-pay | Admitting: Family Medicine

## 2020-07-29 ENCOUNTER — Other Ambulatory Visit: Payer: Self-pay

## 2020-07-29 DIAGNOSIS — U071 COVID-19: Secondary | ICD-10-CM

## 2020-07-29 NOTE — Telephone Encounter (Signed)
Give the advice you noted PLUS give him the info for the infusion clinic b/c he may be a candidate.  -thx

## 2020-07-29 NOTE — Telephone Encounter (Signed)
Spoke with patient and advised of recommendations. He was also made aware that referral would be placed for covid treatment and would receive follow up call if did/did not qualify

## 2020-07-29 NOTE — Telephone Encounter (Signed)
Only recommendations I had were alternate with tylenol or ibuprofen for fever. He can use mucinex DM for congestion if that develops. Rest and hydration of at least 65 ounces daily.   Please advise, thanks.

## 2020-08-03 DIAGNOSIS — R062 Wheezing: Secondary | ICD-10-CM | POA: Diagnosis not present

## 2020-08-03 DIAGNOSIS — U071 COVID-19: Secondary | ICD-10-CM | POA: Diagnosis not present

## 2020-08-03 DIAGNOSIS — R059 Cough, unspecified: Secondary | ICD-10-CM | POA: Diagnosis not present

## 2020-08-03 DIAGNOSIS — J189 Pneumonia, unspecified organism: Secondary | ICD-10-CM | POA: Diagnosis not present

## 2020-08-11 ENCOUNTER — Other Ambulatory Visit: Payer: Self-pay | Admitting: Family Medicine

## 2020-09-08 ENCOUNTER — Other Ambulatory Visit: Payer: Self-pay | Admitting: Family Medicine

## 2020-09-10 ENCOUNTER — Other Ambulatory Visit: Payer: Self-pay

## 2020-09-13 ENCOUNTER — Encounter: Payer: Self-pay | Admitting: Family Medicine

## 2020-09-13 ENCOUNTER — Other Ambulatory Visit: Payer: Self-pay

## 2020-09-13 ENCOUNTER — Ambulatory Visit (INDEPENDENT_AMBULATORY_CARE_PROVIDER_SITE_OTHER): Payer: Medicare Other | Admitting: Family Medicine

## 2020-09-13 VITALS — BP 123/81 | HR 86 | Temp 98.0°F | Resp 16 | Ht 70.0 in | Wt 177.6 lb

## 2020-09-13 DIAGNOSIS — Z23 Encounter for immunization: Secondary | ICD-10-CM | POA: Diagnosis not present

## 2020-09-13 DIAGNOSIS — F411 Generalized anxiety disorder: Secondary | ICD-10-CM | POA: Diagnosis not present

## 2020-09-13 DIAGNOSIS — I10 Essential (primary) hypertension: Secondary | ICD-10-CM | POA: Diagnosis not present

## 2020-09-13 DIAGNOSIS — M1000 Idiopathic gout, unspecified site: Secondary | ICD-10-CM

## 2020-09-13 DIAGNOSIS — Z125 Encounter for screening for malignant neoplasm of prostate: Secondary | ICD-10-CM | POA: Diagnosis not present

## 2020-09-13 DIAGNOSIS — Z79899 Other long term (current) drug therapy: Secondary | ICD-10-CM | POA: Diagnosis not present

## 2020-09-13 DIAGNOSIS — F5101 Primary insomnia: Secondary | ICD-10-CM | POA: Diagnosis not present

## 2020-09-13 DIAGNOSIS — E7800 Pure hypercholesterolemia, unspecified: Secondary | ICD-10-CM | POA: Diagnosis not present

## 2020-09-13 LAB — CBC WITH DIFFERENTIAL/PLATELET
Basophils Absolute: 0 10*3/uL (ref 0.0–0.1)
Basophils Relative: 0.6 % (ref 0.0–3.0)
Eosinophils Absolute: 0.4 10*3/uL (ref 0.0–0.7)
Eosinophils Relative: 6.1 % — ABNORMAL HIGH (ref 0.0–5.0)
HCT: 43.9 % (ref 39.0–52.0)
Hemoglobin: 15.4 g/dL (ref 13.0–17.0)
Lymphocytes Relative: 24.5 % (ref 12.0–46.0)
Lymphs Abs: 1.6 10*3/uL (ref 0.7–4.0)
MCHC: 35.2 g/dL (ref 30.0–36.0)
MCV: 96.6 fl (ref 78.0–100.0)
Monocytes Absolute: 0.6 10*3/uL (ref 0.1–1.0)
Monocytes Relative: 9.7 % (ref 3.0–12.0)
Neutro Abs: 3.9 10*3/uL (ref 1.4–7.7)
Neutrophils Relative %: 59.1 % (ref 43.0–77.0)
Platelets: 251 10*3/uL (ref 150.0–400.0)
RBC: 4.54 Mil/uL (ref 4.22–5.81)
RDW: 13.7 % (ref 11.5–15.5)
WBC: 6.5 10*3/uL (ref 4.0–10.5)

## 2020-09-13 LAB — LIPID PANEL
Cholesterol: 215 mg/dL — ABNORMAL HIGH (ref 0–200)
HDL: 55.6 mg/dL (ref >39.00)
NonHDL: 159.32
Total CHOL/HDL Ratio: 4
Triglycerides: 289 mg/dL — ABNORMAL HIGH (ref 0.0–149.0)
VLDL: 57.8 mg/dL — ABNORMAL HIGH (ref 0.0–40.0)

## 2020-09-13 LAB — COMPREHENSIVE METABOLIC PANEL
ALT: 21 U/L (ref 0–53)
AST: 21 U/L (ref 0–37)
Albumin: 4.4 g/dL (ref 3.5–5.2)
Alkaline Phosphatase: 80 U/L (ref 39–117)
BUN: 15 mg/dL (ref 6–23)
CO2: 27 mEq/L (ref 19–32)
Calcium: 9.6 mg/dL (ref 8.4–10.5)
Chloride: 103 mEq/L (ref 96–112)
Creatinine, Ser: 0.86 mg/dL (ref 0.40–1.50)
GFR: 89.59 mL/min (ref >60.00)
Glucose, Bld: 92 mg/dL (ref 70–99)
Potassium: 4.3 mEq/L (ref 3.5–5.1)
Sodium: 138 mEq/L (ref 135–145)
Total Bilirubin: 1.2 mg/dL (ref 0.2–1.2)
Total Protein: 6.7 g/dL (ref 6.0–8.3)

## 2020-09-13 LAB — PSA, MEDICARE: PSA: 0.27 ng/ml (ref 0.10–4.00)

## 2020-09-13 LAB — LDL CHOLESTEROL, DIRECT: Direct LDL: 131 mg/dL

## 2020-09-13 LAB — URIC ACID: Uric Acid, Serum: 7.2 mg/dL (ref 4.0–7.8)

## 2020-09-13 MED ORDER — LISINOPRIL 20 MG PO TABS: 20.0000 mg | ORAL_TABLET | Freq: Every day | ORAL | 1 refills | Status: DC

## 2020-09-13 NOTE — Addendum Note (Signed)
Addended by: Octaviano Glow on: 09/13/2020 02:56 PM   Modules accepted: Orders

## 2020-09-13 NOTE — Progress Notes (Signed)
OFFICE VISIT  09/13/2020  CC:  Chief Complaint  Patient presents with  . Follow-up    RCI, 6 mo. Pt is fasting   HPI:    Patient is a 69 y.o. Caucasian male who presents for 6 mo f/u HTN, HLD, chronic insomnia, GAD. A/P as of last visit: "1) HTN: The current medical regimen is effective;  continue present plan and medications. Lytes/cr today.  2) HLD: tolerating statin. FLP today.  3) Insomnia: doing well long term on ambien. No new rx was needed today. CSC UTD.  4) GAD: doing well long term on alprazolam. No new rx was needed today. CSC UTD.  "  INTERIM HX: Feeling ok.  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  No side effects.  No depression.  Had covid infection last month. Says got bacterial pneumonia and was rx'd antibiotic and prednisone and says all got better appropriately.  Home bp's 130s over upper 80s.  HLD: taking prava qd w/out problem.   Has chronic anxiety that has been well controlled with scheduled use of alprazolam 0.5mg  qid long term. Also has chronic insomnia, has responded well to nightly use of ambien 12.5mg . PMP AWARE reviewed today: most recent rx for zolpidem was filled 08/30/20, # 30, rx by me. Most recent alpraz rx filled 08/17/20, #120. No red flags.   Past Medical History:  Diagnosis Date  . Anxiety   . BPPV (benign paroxysmal positional vertigo) 06/2014  . 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 Chronic insomnia   . Chronic lumbar pain    DDD; ibuprofen, occasional muscle relaxers.  MRI L-spine 2007 showed some bulging discs with nerve impingement.  Has never had back injection or surgery.  . Contact dermatitis and eczema   . Diverticulitis   . Genital herpes 08/2015   HSV type I on culture  . Gilbert syndrome   . Gout    "always right big toe"--about one episode every 4 yrs.  Right ankle flare, 02/2016.  Marland Kitchen History of adenomatous polyp of colon  2005  . History of bronchitis 08/11/2012  . History of Clostridium difficile colitis 2016  . HTN (hypertension)    started lisinopril 08/20/19  . Hyperlipidemia, mixed    Statin started 04/2018  . PUD (peptic ulcer disease)     Past Surgical History:  Procedure Laterality Date  . COLONOSCOPY  07/2013   Normal.  Recall 10 yrs.  . COLONOSCOPY W/ POLYPECTOMY  2005   Dr. Lyla Son    Outpatient Medications Prior to Visit  Medication Sig Dispense Refill  . ALPRAZolam (XANAX) 0.5 MG tablet TAKE 1 TABLET BY MOUTH FOUR TIMES DAILY AS NEEDED FOR STRESS. 120 tablet 5  . Colchicine (MITIGARE) 0.6 MG CAPS 2 caps po at onset of gout flare, then take another 1 cap 1 hour later 30 capsule 2  . fluticasone (CUTIVATE) 0.05 % cream Apply topically 2 (two) times daily. 30 g 3  . fluticasone (FLONASE) 50 MCG/ACT nasal spray instill 2 sprays into each nostril once daily if needed for allergies 16 g 5  . ibuprofen (ADVIL) 800 MG tablet TAKE 1 TABLET BY MOUTH THREE TIMES DAILY WITH FOOD AS NEEDED FOR PAIN 90 tablet 2  . ketoconazole (NIZORAL) 2 % shampoo Apply to scalp every other day as needed for scalp seborrheic dermatitis 120 mL 11  . Multiple Vitamin (MULTIVITAMIN) tablet Take 1 tablet by mouth daily.    Marland Kitchen  pantoprazole (PROTONIX) 40 MG tablet TAKE 1 TABLET BY MOUTH EVERY MORNING 90 tablet 0  . pravastatin (PRAVACHOL) 80 MG tablet Take 1 tablet (80 mg total) by mouth daily. 90 tablet 3  . saccharomyces boulardii (FLORASTOR) 250 MG capsule Take 1 capsules twice daily. 60 capsule 3  . zolpidem (AMBIEN CR) 12.5 MG CR tablet TAKE 1 TABLET BY MOUTH AT BEDTIME AS NEEDED 30 tablet 5  . lisinopril (ZESTRIL) 10 MG tablet TAKE 1 TABLET(10 MG) BY MOUTH DAILY 30 tablet 0   No facility-administered medications prior to visit.    Allergies  Allergen Reactions  . Prednisone Other (See Comments)    Insomnia, elevated BP and HR  . Augmentin [Amoxicillin-Pot Clavulanate] Diarrhea    Upset stomach    ROS As per  HPI  PE: Vitals with BMI 09/13/2020 03/11/2020 09/01/2019  Height 5\' 10"  5\' 10"  5\' 10"   Weight 177 lbs 10 oz 172 lbs 180 lbs 3 oz  BMI 25.48 23.30 07.62  Systolic 263 335 456  Diastolic 81 88 84  Pulse 86 77 92   Gen: Alert, well appearing.  Patient is oriented to person, place, time, and situation. AFFECT: pleasant, lucid thought and speech. CV: RRR, no m/r/g.   LUNGS: CTA bilat, nonlabored resps, good aeration in all lung fields. EXT: no clubbing or cyanosis.  no edema.    LABS:  Lab Results  Component Value Date   TSH 3.74 04/22/2018   Lab Results  Component Value Date   WBC 7.9 08/20/2019   HGB 15.3 08/20/2019   HCT 43.6 08/20/2019   MCV 97.4 08/20/2019   PLT 216.0 08/20/2019   Lab Results  Component Value Date   CREATININE 1.01 03/11/2020   BUN 17 03/11/2020   NA 137 03/11/2020   K 4.2 03/11/2020   CL 103 03/11/2020   CO2 26 03/11/2020   Lab Results  Component Value Date   ALT 23 08/20/2019   AST 21 08/20/2019   ALKPHOS 83 08/20/2019   BILITOT 1.7 (H) 08/20/2019   Lab Results  Component Value Date   CHOL 194 03/11/2020   Lab Results  Component Value Date   HDL 51.40 03/11/2020   No results found for: [facility] Lab Results  Component Value Date   TRIG 280.0 (H) 03/11/2020   Lab Results  Component Value Date   CHOLHDL 4 03/11/2020   Lab Results  Component Value Date   PSA 0.23 08/20/2019   PSA 0.56 04/22/2018   PSA 0.41 04/19/2017   Lab Results  Component Value Date   LABURIC 7.8 08/20/2019    IMPRESSION AND PLAN:  1) HTN, not ideal control. Inc lisinopril to 20mg  qd. Cont home bp monitoring and f/u to review in 1 mo. Lytes/cr today.  2) HLD: tolerating pravastatin 80mg  qd. FLP and hepatic panel today.  3) Gout: no flares in quite a while, has colchicine for prn use, no prophylactic. Check uric acid level today.  4) GAD and anxiety-related insomnia: stable on xanax 0.5mg  q6h and zolpidem 12.5mg  qhs long term. CSC updated  today. UDS today.  Should show alpraz and nothing else.  Preventative health care: Prevnar 13->given today.  Flu->pt declined.  Shingrix->will discuss next visit. Prostate ca screening: PSA today. Colon ca screening: next colonoscopy 2025.  An After Visit Summary was printed and given to the patient.  FOLLOW UP: Return in about 4 weeks (around 10/11/2020) for f/u HTN.  Signed:  Crissie Sickles, MD  09/13/2020   

## 2020-09-16 LAB — DM TEMPLATE

## 2020-09-16 LAB — DRUG MONITORING, PANEL 8 WITH CONFIRMATION, URINE
6 Acetylmorphine: NEGATIVE ng/mL (ref <10)
Alcohol Metabolites: POSITIVE ng/mL — AB
Alphahydroxyalprazolam: 223 ng/mL — ABNORMAL HIGH (ref <25)
Alphahydroxymidazolam: NEGATIVE ng/mL (ref <50)
Alphahydroxytriazolam: NEGATIVE ng/mL (ref <50)
Aminoclonazepam: NEGATIVE ng/mL (ref <25)
Amphetamines: NEGATIVE ng/mL (ref <500)
Benzodiazepines: POSITIVE ng/mL — AB (ref <100)
Buprenorphine, Urine: NEGATIVE ng/mL (ref <5)
Cocaine Metabolite: NEGATIVE ng/mL (ref <150)
Creatinine: 70.2 mg/dL
Ethyl Glucuronide (ETG): 7546 ng/mL — ABNORMAL HIGH (ref <500)
Ethyl Sulfate (ETS): 1844 ng/mL — ABNORMAL HIGH (ref <100)
Hydroxyethylflurazepam: NEGATIVE ng/mL (ref <50)
Lorazepam: NEGATIVE ng/mL (ref <50)
MDMA: NEGATIVE ng/mL (ref <500)
Marijuana Metabolite: NEGATIVE ng/mL (ref <20)
Nordiazepam: NEGATIVE ng/mL (ref <50)
Opiates: NEGATIVE ng/mL (ref <100)
Oxazepam: NEGATIVE ng/mL (ref <50)
Oxidant: NEGATIVE ug/mL
Oxycodone: NEGATIVE ng/mL (ref <100)
Temazepam: NEGATIVE ng/mL (ref <50)
pH: 5.7 (ref 4.5–9.0)

## 2020-10-11 ENCOUNTER — Ambulatory Visit (INDEPENDENT_AMBULATORY_CARE_PROVIDER_SITE_OTHER): Payer: Medicare Other | Admitting: Family Medicine

## 2020-10-11 ENCOUNTER — Other Ambulatory Visit: Payer: Self-pay

## 2020-10-11 ENCOUNTER — Encounter: Payer: Self-pay | Admitting: Family Medicine

## 2020-10-11 VITALS — BP 105/67 | HR 82 | Temp 98.1°F | Resp 16 | Ht 70.0 in | Wt 175.2 lb

## 2020-10-11 DIAGNOSIS — I10 Essential (primary) hypertension: Secondary | ICD-10-CM | POA: Diagnosis not present

## 2020-10-11 MED ORDER — PRAVASTATIN SODIUM 80 MG PO TABS: 80.0000 mg | ORAL_TABLET | Freq: Every day | ORAL | 3 refills | Status: DC

## 2020-10-11 MED ORDER — LISINOPRIL 20 MG PO TABS: 20.0000 mg | ORAL_TABLET | Freq: Every day | ORAL | 3 refills | Status: DC

## 2020-10-11 MED ORDER — IBUPROFEN 800 MG PO TABS: ORAL_TABLET | ORAL | 2 refills | Status: DC

## 2020-10-11 MED ORDER — PANTOPRAZOLE SODIUM 40 MG PO TBEC: 40.0000 mg | DELAYED_RELEASE_TABLET | Freq: Every morning | ORAL | 3 refills | Status: DC

## 2020-10-11 NOTE — Progress Notes (Signed)
OFFICE VISIT  10/11/2020  CC:  Chief Complaint  Patient presents with  . Follow-up    Hypertension, fasting    HPI:    Patient is a 70 y.o. Caucasian male who presents for 4 wk f/u uncontrolled HTN. A/P as of last visit: "1) HTN, not ideal control. Inc lisinopril to 20mg  qd. Cont home bp monitoring and f/u to review in 1 mo. Lytes/cr today.  2) HLD: tolerating pravastatin 80mg  qd. FLP and hepatic panel today.  3) Gout: no flares in quite a while, has colchicine for prn use, no prophylactic. Check uric acid level today.  4) GAD and anxiety-related insomnia: stable on xanax 0.5mg  q6h and zolpidem 12.5mg  qhs long term. CSC updated today. UDS today.  Should show alpraz and nothing else.  5) Preventative health care: Prevnar 13->given today.  Flu->pt declined.  Shingrix->will discuss next visit. Prostate ca screening: PSA today. Colon ca screening: next colonoscopy 2025."  INTERIM HX: Feeling well, no probs with lisin 20mg  qd. Home bp's 120/70s.  HR low 80s.   Past Medical History:  Diagnosis Date  . Anxiety   . BPPV (benign paroxysmal positional vertigo) 06/2014  . 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 Chronic insomnia   . Chronic lumbar pain    DDD; ibuprofen, occasional muscle relaxers.  MRI L-spine 2007 showed some bulging discs with nerve impingement.  Has never had back injection or surgery.  . Contact dermatitis and eczema   . Diverticulitis   . Genital herpes 08/2015   HSV type I on culture  . Gilbert syndrome   . Gout    "always right big toe"--about one episode every 4 yrs.  Right ankle flare, 02/2016.  Marland Kitchen History of adenomatous polyp of colon 2005  . History of bronchitis 08/11/2012  . History of Clostridium difficile colitis 2016  . HTN (hypertension)    started lisinopril 08/20/19  . Hyperlipidemia, mixed    Statin started 04/2018  . PUD (peptic ulcer disease)     Past Surgical History:  Procedure Laterality Date  .  COLONOSCOPY  07/2013   Normal.  Recall 10 yrs.  . COLONOSCOPY W/ POLYPECTOMY  2005   Dr. Lyla Son    Outpatient Medications Prior to Visit  Medication Sig Dispense Refill  . ALPRAZolam (XANAX) 0.5 MG tablet TAKE 1 TABLET BY MOUTH FOUR TIMES DAILY AS NEEDED FOR STRESS. 120 tablet 5  . Colchicine (MITIGARE) 0.6 MG CAPS 2 caps po at onset of gout flare, then take another 1 cap 1 hour later 30 capsule 2  . fluticasone (CUTIVATE) 0.05 % cream Apply topically 2 (two) times daily. 30 g 3  . fluticasone (FLONASE) 50 MCG/ACT nasal spray instill 2 sprays into each nostril once daily if needed for allergies 16 g 5  . ketoconazole (NIZORAL) 2 % shampoo Apply to scalp every other day as needed for scalp seborrheic dermatitis 120 mL 11  . Multiple Vitamin (MULTIVITAMIN) tablet Take 1 tablet by mouth daily.    Marland Kitchen saccharomyces boulardii (FLORASTOR) 250 MG capsule Take 1 capsules twice daily. 60 capsule 3  . zolpidem (AMBIEN CR) 12.5 MG CR tablet TAKE 1 TABLET BY MOUTH AT BEDTIME AS NEEDED 30 tablet 5  . ibuprofen (ADVIL) 800 MG tablet TAKE 1 TABLET BY MOUTH THREE TIMES DAILY WITH FOOD AS NEEDED FOR PAIN 90 tablet 2  . lisinopril (ZESTRIL) 20 MG tablet Take 1 tablet (20 mg total) by mouth daily. 30 tablet 1  . pantoprazole (PROTONIX) 40 MG  tablet TAKE 1 TABLET BY MOUTH EVERY MORNING 90 tablet 0  . pravastatin (PRAVACHOL) 80 MG tablet Take 1 tablet (80 mg total) by mouth daily. 90 tablet 3   No facility-administered medications prior to visit.    Allergies  Allergen Reactions  . Prednisone Other (See Comments)    Insomnia, elevated BP and HR  . Augmentin [Amoxicillin-Pot Clavulanate] Diarrhea    Upset stomach    ROS As per HPI  PE: Vitals with BMI 10/11/2020 09/13/2020 03/11/2020  Height 5\' 10"  5\' 10"  5\' 10"   Weight 175 lbs 3 oz 177 lbs 10 oz 172 lbs  BMI 25.14 86.57 84.69  Systolic 629 528 413  Diastolic 67 81 88  Pulse 82 86 77   Gen: Alert, well appearing.  Patient is oriented to person,  place, time, and situation. AFFECT: pleasant, lucid thought and speech. CV: RRR, no m/r/g.   LUNGS: CTA bilat, nonlabored resps, good aeration in all lung fields. EXT: no clubbing or cyanosis.  no edema.    LABS:    Chemistry      Component Value Date/Time   NA 138 09/13/2020 0856   K 4.3 09/13/2020 0856   CL 103 09/13/2020 0856   CO2 27 09/13/2020 0856   BUN 15 09/13/2020 0856   CREATININE 0.86 09/13/2020 0856      Component Value Date/Time   CALCIUM 9.6 09/13/2020 0856   ALKPHOS 80 09/13/2020 0856   AST 21 09/13/2020 0856   ALT 21 09/13/2020 0856   BILITOT 1.2 09/13/2020 0856     Lab Results  Component Value Date   CHOL 215 (H) 09/13/2020   HDL 55.60 09/13/2020   LDLDIRECT 131.0 09/13/2020   TRIG 289.0 (H) 09/13/2020   CHOLHDL 4 09/13/2020   IMPRESSION AND PLAN:  HTN, well controlled on lisinopril 20mg  qd now. No labs needed.  We reviewed his chronic use of ibuprofen for low back pain---1-3 times per day long term, wears lumbar support brace daily, remote hx of radiculopathy sx's, remote hx of seeing a back doctor. He is aware of potential cons of longterm regular use of nsaids.  So far his renal function has remained stable but he understands that if kidney function should start to decline then NSAIDs would be first med to start getting strict with.  An After Visit Summary was printed and given to the patient.  FOLLOW UP: Return in about 5 months (around 03/13/2021) for routine chronic illness f/u.   Signed:  Crissie Sickles, MD           10/11/2020

## 2020-10-27 ENCOUNTER — Telehealth: Payer: Self-pay | Admitting: Family Medicine

## 2020-10-27 NOTE — Telephone Encounter (Signed)
Attempted to schedule AWV. Unable to LVM.  Will try at later time.  

## 2020-10-28 ENCOUNTER — Other Ambulatory Visit: Payer: Self-pay

## 2020-10-28 NOTE — Telephone Encounter (Signed)
Requesting:zolpidem (AMBIEN CR) 12.5 MG CR tablet Contract:09/13/20 UDS:09/13/20 Last Visit:10/11/20 Next Visit: 03/14/21 Last Refill: 05/03/20 (30,5)  Please Advise

## 2020-10-29 DIAGNOSIS — Z760 Encounter for issue of repeat prescription: Secondary | ICD-10-CM | POA: Diagnosis not present

## 2020-10-29 DIAGNOSIS — G4700 Insomnia, unspecified: Secondary | ICD-10-CM | POA: Diagnosis not present

## 2020-11-01 MED ORDER — ZOLPIDEM TARTRATE ER 12.5 MG PO TBCR: 12.5000 mg | EXTENDED_RELEASE_TABLET | Freq: Every evening | ORAL | 5 refills | Status: DC | PRN

## 2020-11-01 NOTE — Telephone Encounter (Signed)
Patient calling back regarding refill status.  He said he has been without meds since Thursday. Patient is aware Dr. Anitra Lauth is out of office, and checking messages periodically thru out the day.  zolpidem (AMBIEN CR) 12.5 MG CR tablet [[id number]]   [place], [place]

## 2020-11-01 NOTE — Telephone Encounter (Signed)
Please advise 

## 2020-11-15 ENCOUNTER — Other Ambulatory Visit: Payer: Self-pay | Admitting: Family Medicine

## 2020-11-17 ENCOUNTER — Other Ambulatory Visit: Payer: Self-pay

## 2020-11-17 ENCOUNTER — Encounter: Payer: Self-pay | Admitting: Family Medicine

## 2020-11-17 MED ORDER — ALPRAZOLAM 0.5 MG PO TABS: ORAL_TABLET | ORAL | 5 refills | Status: DC

## 2020-11-17 NOTE — Telephone Encounter (Signed)
Requesting: alprazolam Contract: 08/20/19 UDS: 09/13/20 Last Visit: 10/11/20 Next Visit: 03/14/21 Last Refill: 05/18/20(120,5)  Please Advise. Medication pending

## 2020-12-21 ENCOUNTER — Other Ambulatory Visit: Payer: Self-pay

## 2020-12-21 ENCOUNTER — Encounter: Payer: Self-pay | Admitting: Family Medicine

## 2020-12-21 MED ORDER — COLCHICINE 0.6 MG PO CAPS: ORAL_CAPSULE | ORAL | 2 refills | Status: DC

## 2021-01-04 ENCOUNTER — Other Ambulatory Visit: Payer: Self-pay | Admitting: Family Medicine

## 2021-02-23 ENCOUNTER — Ambulatory Visit (INDEPENDENT_AMBULATORY_CARE_PROVIDER_SITE_OTHER): Payer: Medicare Other | Admitting: *Deleted

## 2021-02-23 DIAGNOSIS — Z0000 Encounter for general adult medical examination without abnormal findings: Secondary | ICD-10-CM

## 2021-02-23 NOTE — Patient Instructions (Signed)
-Keith Sanders , Thank you for taking time to come for your Medicare Wellness Visit. I appreciate your ongoing commitment to your health goals. Please review the following plan we discussed and let me know if I can assist you in the future.   Screening recommendations/referrals: Colonoscopy: up to date Recommended yearly ophthalmology/optometry visit for glaucoma screening and checkup Recommended yearly dental visit for hygiene and checkup  Vaccinations: Influenza vaccine: up to date Pneumococcal vaccine: up to date Tdap vaccine: up to date Shingles vaccine: Education provided    Advanced directives: copy requested  Conditions/risks identified: na - Next appointment: 03-14-2021 @ 8:00  Dr. Anitra Lauth  Preventive Care 60 Years and Older, Male Preventive care refers to lifestyle choices and visits with your health care provider that can promote health and wellness. What does preventive care include? A yearly physical exam. This is also called an annual well check. Dental exams once or twice a year. Routine eye exams. Ask your health care provider how often you should have your eyes checked. Personal lifestyle choices, including: Daily care of your teeth and gums. Regular physical activity. Eating a healthy diet. Avoiding tobacco and drug use. Limiting alcohol use. Practicing safe sex. Taking low doses of aspirin every day. Taking vitamin and mineral supplements as recommended by your health care provider. What happens during an annual well check? The services and screenings done by your health care provider during your annual well check will depend on your age, overall health, lifestyle risk factors, and family history of disease. Counseling  Your health care provider may ask you questions about your: Alcohol use. Tobacco use. Drug use. Emotional well-being. Home and relationship well-being. Sexual activity. Eating habits. History of falls. Memory and ability to understand  (cognition). Work and work Statistician. Screening  You may have the following tests or measurements: Height, weight, and BMI. Blood pressure. Lipid and cholesterol levels. These may be checked every 5 years, or more frequently if you are over 49 years old. Skin check. Lung cancer screening. You may have this screening every year starting at age 38 if you have a 30-pack-year history of smoking and currently smoke or have quit within the past 15 years. Fecal occult blood test (FOBT) of the stool. You may have this test every year starting at age 41. Flexible sigmoidoscopy or colonoscopy. You may have a sigmoidoscopy every 5 years or a colonoscopy every 10 years starting at age 62. Prostate cancer screening. Recommendations will vary depending on your family history and other risks. Hepatitis C blood test. Hepatitis B blood test. Sexually transmitted disease (STD) testing. Diabetes screening. This is done by checking your blood sugar (glucose) after you have not eaten for a while (fasting). You may have this done every 1-3 years. Abdominal aortic aneurysm (AAA) screening. You may need this if you are a current or former smoker. Osteoporosis. You may be screened starting at age 49 if you are at high risk. Talk with your health care provider about your test results, treatment options, and if necessary, the need for more tests. Vaccines  Your health care provider may recommend certain vaccines, such as: Influenza vaccine. This is recommended every year. Tetanus, diphtheria, and acellular pertussis (Tdap, Td) vaccine. You may need a Td booster every 10 years. Zoster vaccine. You may need this after age 55. Pneumococcal 13-valent conjugate (PCV13) vaccine. One dose is recommended after age 45. Pneumococcal polysaccharide (PPSV23) vaccine. One dose is recommended after age 36. Talk to your health care provider about which screenings  and vaccines you need and how often you need them. This  information is not intended to replace advice given to you by your health care provider. Make sure you discuss any questions you have with your health care provider. Document Released: 07/30/2015 Document Revised: 03/22/2016 Document Reviewed: 05/04/2015 Elsevier Interactive Patient Education  2017 [place] Prevention in the Home Falls can cause injuries. They can happen to people of all ages. There are many things you can do to make your home safe and to help prevent falls. What can I do on the outside of my home? Regularly fix the edges of walkways and driveways and fix any cracks. Remove anything that might make you trip as you walk through a door, such as a raised step or threshold. Trim any bushes or trees on the path to your home. Use bright outdoor lighting. Clear any walking paths of anything that might make someone trip, such as rocks or tools. Regularly check to see if handrails are loose or broken. Make sure that both sides of any steps have handrails. Any raised decks and porches should have guardrails on the edges. Have any leaves, snow, or ice cleared regularly. Use sand or salt on walking paths during winter. Clean up any spills in your garage right away. This includes oil or grease spills. What can I do in the bathroom? Use night lights. Install grab bars by the toilet and in the tub and shower. Do not use towel bars as grab bars. Use non-skid mats or decals in the tub or shower. If you need to sit down in the shower, use a plastic, non-slip stool. Keep the floor dry. Clean up any water that spills on the floor as soon as it happens. Remove soap buildup in the tub or shower regularly. Attach bath mats securely with double-sided non-slip rug tape. Do not have throw rugs and other things on the floor that can make you trip. What can I do in the bedroom? Use night lights. Make sure that you have a light by your bed that is easy to reach. Do not use any sheets or  blankets that are too big for your bed. They should not hang down onto the floor. Have a firm chair that has side arms. You can use this for support while you get dressed. Do not have throw rugs and other things on the floor that can make you trip. What can I do in the kitchen? Clean up any spills right away. Avoid walking on wet floors. Keep items that you use a lot in easy-to-reach places. If you need to reach something above you, use a strong step stool that has a grab bar. Keep electrical cords out of the way. Do not use floor polish or wax that makes floors slippery. If you must use wax, use non-skid floor wax. Do not have throw rugs and other things on the floor that can make you trip. What can I do with my stairs? Do not leave any items on the stairs. Make sure that there are handrails on both sides of the stairs and use them. Fix handrails that are broken or loose. Make sure that handrails are as long as the stairways. Check any carpeting to make sure that it is firmly attached to the stairs. Fix any carpet that is loose or worn. Avoid having throw rugs at the top or bottom of the stairs. If you do have throw rugs, attach them to the floor with carpet tape.  Make sure that you have a light switch at the top of the stairs and the bottom of the stairs. If you do not have them, ask someone to add them for you. What else can I do to help prevent falls? Wear shoes that: Do not have high heels. Have rubber bottoms. Are comfortable and fit you well. Are closed at the toe. Do not wear sandals. If you use a stepladder: Make sure that it is fully opened. Do not climb a closed stepladder. Make sure that both sides of the stepladder are locked into place. Ask someone to hold it for you, if possible. Clearly [place] and make sure that you can see: Any grab bars or handrails. First and last steps. Where the edge of each step is. Use tools that help you move around (mobility aids) if they are  needed. These include: Canes. Walkers. Scooters. Crutches. Turn on the lights when you go into a dark area. Replace any light bulbs as soon as they burn out. Set up your furniture so you have a clear path. Avoid moving your furniture around. If any of your floors are uneven, fix them. If there are any pets around you, be aware of where they are. Review your medicines with your doctor. Some medicines can make you feel dizzy. This can increase your chance of falling. Ask your doctor what other things that you can do to help prevent falls. This information is not intended to replace advice given to you by your health care provider. Make sure you discuss any questions you have with your health care provider. Document Released: 04/29/2009 Document Revised: 12/09/2015 Document Reviewed: 08/07/2014 Elsevier Interactive Patient Education  2017 [employer].

## 2021-02-23 NOTE — Progress Notes (Signed)
Subjective:   Keith Sanders is a 70 y.o. male who presents for Medicare Annual/Subsequent preventive examination.  I connected with  Keith Sanders on 02/23/21 by a telephone enabled telemedicine application and verified that I am speaking with the correct person using two identifiers.   I discussed the limitations of evaluation and management by telemedicine. The patient expressed understanding and agreed to proceed.   Review of Systems    na Cardiac Risk Factors include: advanced age (>11mn, >>84women);hypertension;male gender     Objective:    Today's Vitals   There is no height or weight on file to calculate BMI.  Advanced Directives 02/23/2021 03/06/2015 06/17/2014  Does Patient Have a Medical Advance Directive? Yes No No  Type of Advance Directive Living will - -  Would patient like information on creating a medical advance directive? - No - patient declined information -    Current Medications (verified) Outpatient Encounter Medications as of 02/23/2021  Medication Sig   ALPRAZolam (XANAX) 0.5 MG tablet TAKE 1 TABLET BY MOUTH FOUR TIMES DAILY AS NEEDED FOR STRESS   Colchicine (MITIGARE) 0.6 MG CAPS 2 caps po at onset of gout flare, then take another 1 cap 1 hour later   fluticasone (CUTIVATE) 0.05 % cream Apply topically 2 (two) times daily.   fluticasone (FLONASE) 50 MCG/ACT nasal spray instill 2 sprays into each nostril once daily if needed for allergies   ibuprofen (ADVIL) 800 MG tablet TAKE 1 TABLET BY MOUTH THREE TIMES DAILY WITH FOOD AS NEEDED FOR PAIN   ketoconazole (NIZORAL) 2 % shampoo Apply to scalp every other day as needed for scalp seborrheic dermatitis   lisinopril (ZESTRIL) 20 MG tablet Take 1 tablet (20 mg total) by mouth daily.   Multiple Vitamin (MULTIVITAMIN) tablet Take 1 tablet by mouth daily.   pantoprazole (PROTONIX) 40 MG tablet Take 1 tablet (40 mg total) by mouth every morning.   pravastatin (PRAVACHOL) 80 MG tablet Take 1 tablet (80 mg total) by  mouth daily.   saccharomyces boulardii (FLORASTOR) 250 MG capsule Take 1 capsules twice daily.   zolpidem (AMBIEN CR) 12.5 MG CR tablet Take 1 tablet (12.5 mg total) by mouth at bedtime as needed.   No facility-administered encounter medications on file as of 02/23/2021.    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  MRI L-spine 2007 showed some bulging discs with nerve impingement.  Has never had back injection or surgery.   Contact dermatitis and eczema    Diverticulitis    Genital herpes 08/2015   HSV type I on culture   Gilbert syndrome    Gout    "always right big toe"--about one episode every 4 yrs.  Right ankle flare, 02/2016.   History of adenomatous polyp of colon 2005   History of bronchitis 08/11/2012   History of Clostridium difficile colitis 2016   HTN (hypertension)    started lisinopril 08/20/19   Hyperlipidemia, mixed    Statin started 04/2018   PUD (peptic ulcer disease)    Past Surgical History:  Procedure Laterality Date   COLONOSCOPY  07/2013   Normal.  Recall 10 yrs.   COLONOSCOPY W/ POLYPECTOMY  2005   Dr. SLyla Son  Family History  Problem Relation Age of Onset   Heart attack Father 50   Heart disease Father        MI age 59   Heart disease Other    Colon polyps Mother    Ovarian cancer Sister    Colon cancer Neg Hx    Social History   Socioeconomic History   Marital status: Married    Spouse name: Not on file   Number of children: 3   Years of education: Not on file   Highest education level: Not on file  Occupational History   Occupation: sales  Tobacco Use   Smoking status: Never   Smokeless tobacco: Never  Vaping Use   Vaping Use: Never used   Substance and Sexual Activity   Alcohol use: Yes    Alcohol/week: 14.0 standard drinks    Types: 14 Cans of beer per week   Drug use: No   Sexual activity: Yes  Other Topics Concern   Not on file  Social History Narrative   Married, 3 grown children.   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   Lived in [place] area all his life.   No T/A/Ds.   Walks 3-4 times per day.   Social Determinants of Health   Financial Resource Strain: Low Risk    Difficulty of Paying Living Expenses: Not hard at all  Food Insecurity: No Food Insecurity   Worried About Charity fundraiser in the Last Year: Never true   [place] in the Last Year: Never true  Transportation Needs: No Transportation Needs   Lack of Transportation (Medical): No   Lack of Transportation (Non-Medical): No  Physical Activity: Sufficiently Active   Days of Exercise per Week: 5 days   Minutes of Exercise per Session: 40 min  Stress: No Stress Concern Present   Feeling of Stress : Not at all  Social Connections: Moderately Integrated   Frequency of Communication with Friends and Family: More than three times a week   Frequency of Social Gatherings with Friends and Family: More than three times a week   Attends Religious Services: 1 to 4 times per year   Active Member of [employer] or Organizations: No   Attends Music therapist: Never   Marital Status: Married    Tobacco Counseling Counseling given: Not Answered   Clinical Intake:  Pre-visit preparation completed: Yes  Pain : No/denies pain     Nutritional Risks: None Diabetes: No  How often do you need to have someone help you when you read instructions, pamphlets, or other written materials from your doctor or pharmacy?: 1 - Never  Diabetic?no  Interpreter Needed?: No  Information entered by :: Leroy Kennedy LPN   Activities of Daily Living In your present state of health, do you have any difficulty performing the following activities:  02/23/2021 09/13/2020  Hearing? N N  Vision? N N  Difficulty concentrating or making decisions? N N  Walking or climbing stairs? N N  Dressing or bathing? N N  Doing errands, shopping? N N  Preparing Food and eating ? N -  Using the Toilet? N -  In the past six months, have you accidently leaked urine? N -  Do you have problems with loss of bowel control? N -  Managing your Medications? N -  Managing your Finances? N -  Housekeeping or managing your Housekeeping? N -  Some recent data might be hidden    Patient Care Team: McGowen,  Adrian Blackwater, MD as PCP - General (Family Medicine) Ashok Pall, MD as Consulting Physician (Neurosurgery) Dene Gentry, MD as Consulting Physician (Sports Medicine) Rosemarie Ax, MD as Consulting Physician (Sports Medicine)  Indicate any recent Medical Services you may have received from other than [facility] providers in the past year (date may be approximate).     Assessment:   This is a routine wellness examination for [employer].  Hearing/Vision screen Hearing Screening - Comments:: No trouble hearing Vision Screening - Comments:: Had lasix surgery Marica Otter  Dietary issues and exercise activities discussed: Current Exercise Habits: Home exercise routine, Time (Minutes): 40, Frequency (Times/Week): 4, Weekly Exercise (Minutes/Week): 160, Intensity: Moderate   Goals Addressed             This Visit's Progress    Patient Stated       Continue to be able to work        Depression Screen PHQ 2/9 Scores 02/23/2021 09/13/2020 03/11/2020 01/16/2019 07/22/2018 04/22/2018 04/19/2017  PHQ - 2 Score 0 0 0 0 0 0 0    Fall Risk Fall Risk  09/13/2020 03/11/2020 01/16/2019 07/22/2018 04/22/2018  Falls in the past year? 0 0 0 0 No  Number falls in past yr: 0 0 0 - -  Injury with Fall? 0 0 0 - -  Follow up Falls evaluation completed Falls evaluation completed Falls evaluation completed - -    FALL RISK PREVENTION PERTAINING TO THE HOME:  Any stairs in or  around the home? Yes  If so, are there any without handrails? Yes  Home free of loose throw rugs in walkways, pet beds, electrical cords, etc? Yes  Adequate lighting in your home to reduce risk of falls? Yes   ASSISTIVE DEVICES UTILIZED TO PREVENT FALLS:  Life alert? No  Use of a cane, walker or w/c? No  Grab bars in the bathroom? No  Shower chair or bench in shower? Yes  Elevated toilet seat or a handicapped toilet? No   TIMED UP AND GO:  Was the test performed? No .   Cognitive Function: Normal cognitive status assessed by direct observation by this Nurse Health Advisor. No abnormalities found.           Immunizations Immunization History  Administered Date(s) Administered   PFIZER(Purple Top)SARS-COV-2 Vaccination 11/28/2019, 12/19/2019   Pneumococcal Conjugate-13 09/13/2020   Pneumococcal Polysaccharide-23 04/22/2018, 08/20/2019   Tdap 07/20/2011    TDAP status: Up to date  Flu Vaccine status: Up to date  Pneumococcal vaccine status: Up to date  Covid-19 vaccine status: Information provided on how to obtain vaccines.   Qualifies for Shingles Vaccine? Yes   Zostavax completed No   Shingrix Completed?: No.    Education has been provided regarding the importance of this vaccine. Patient has been advised to call insurance company to determine out of pocket expense if they have not yet received this vaccine. Advised may also receive vaccine at local pharmacy or Health Dept. Verbalized acceptance and understanding.  Screening Tests Health Maintenance  Topic Date Due   Zoster Vaccines- Shingrix (1 of 2) Never done   COVID-19 Vaccine (3 - Pfizer risk series) 01/16/2020   INFLUENZA VACCINE  02/14/2021   TETANUS/TDAP  07/19/2021   COLONOSCOPY (Pts 45-45yr Insurance coverage will need to be confirmed)  08/13/2023   PNA vac Low Risk Adult  Completed   HPV VACCINES  Aged Out   Hepatitis C Screening  Discontinued    Health Maintenance  Health Maintenance Due  Topic Date Due   Zoster Vaccines- Shingrix (1 of 2) Never done   COVID-19 Vaccine (3 - Pfizer risk series) 01/16/2020   INFLUENZA VACCINE  02/14/2021    Colorectal cancer screening: Type of screening: Colonoscopy. Completed 2015. Repeat every 10 years  Lung Cancer Screening: (Low Dose CT Chest recommended if Age 23-80 years, 30 pack-year currently smoking OR have quit w/in 15years.) does not qualify.   Lung Cancer Screening Referral:   Additional Screening:  Hepatitis C Screening: does qualify;   Vision Screening: Recommended annual ophthalmology exams for early detection of glaucoma and other disorders of the eye. Is the patient up to date with their annual eye exam?  No  Who is the provider or what is the name of the office in which the patient attends annual eye exams? Dr. Sabra Heck If pt is not established with a provider, would they like to be referred to a provider to establish care? No .   Dental Screening: Recommended annual dental exams for proper oral hygiene  Community Resource Referral / Chronic Care Management: CRR required this visit?  No   CCM required this visit?  No      Plan:     I have personally reviewed and noted the following in the patient's chart:   Medical and social history Use of alcohol, tobacco or illicit drugs  Current medications and supplements including opioid prescriptions. Patient is not currently taking opioid prescriptions. Functional ability and status Nutritional status Physical activity Advanced directives List of other physicians Hospitalizations, surgeries, and ER visits in previous 12 months Vitals Screenings to include cognitive, depression, and falls Referrals and appointments  In addition, I have reviewed and discussed with patient certain preventive protocols, quality metrics, and best practice recommendations. A written personalized care plan for preventive services as well as general preventive health recommendations were  provided to patient.     Leroy Kennedy, LPN   [id number]   Nurse Notes:na

## 2021-03-14 ENCOUNTER — Ambulatory Visit (INDEPENDENT_AMBULATORY_CARE_PROVIDER_SITE_OTHER): Payer: Medicare Other | Admitting: Family Medicine

## 2021-03-14 ENCOUNTER — Other Ambulatory Visit: Payer: Self-pay

## 2021-03-14 ENCOUNTER — Encounter: Payer: Self-pay | Admitting: Family Medicine

## 2021-03-14 VITALS — BP 125/82 | HR 83 | Temp 97.5°F | Wt 171.8 lb

## 2021-03-14 DIAGNOSIS — I10 Essential (primary) hypertension: Secondary | ICD-10-CM | POA: Diagnosis not present

## 2021-03-14 DIAGNOSIS — E7800 Pure hypercholesterolemia, unspecified: Secondary | ICD-10-CM

## 2021-03-14 DIAGNOSIS — F411 Generalized anxiety disorder: Secondary | ICD-10-CM

## 2021-03-14 DIAGNOSIS — F5104 Psychophysiologic insomnia: Secondary | ICD-10-CM

## 2021-03-14 LAB — COMPREHENSIVE METABOLIC PANEL
ALT: 22 U/L (ref 0–53)
AST: 16 U/L (ref 0–37)
Albumin: 4.2 g/dL (ref 3.5–5.2)
Alkaline Phosphatase: 68 U/L (ref 39–117)
BUN: 14 mg/dL (ref 6–23)
CO2: 25 mEq/L (ref 19–32)
Calcium: 9.3 mg/dL (ref 8.4–10.5)
Chloride: 106 mEq/L (ref 96–112)
Creatinine, Ser: 0.96 mg/dL (ref 0.40–1.50)
GFR: 81.5 mL/min (ref >60.00)
Glucose, Bld: 77 mg/dL (ref 70–99)
Potassium: 4.1 mEq/L (ref 3.5–5.1)
Sodium: 141 mEq/L (ref 135–145)
Total Bilirubin: 1.2 mg/dL (ref 0.2–1.2)
Total Protein: 6.2 g/dL (ref 6.0–8.3)

## 2021-03-14 LAB — LIPID PANEL
Cholesterol: 164 mg/dL (ref 0–200)
HDL: 49.2 mg/dL (ref >39.00)
NonHDL: 114.89
Total CHOL/HDL Ratio: 3
Triglycerides: 208 mg/dL — ABNORMAL HIGH (ref 0.0–149.0)
VLDL: 41.6 mg/dL — ABNORMAL HIGH (ref 0.0–40.0)

## 2021-03-14 LAB — LDL CHOLESTEROL, DIRECT: Direct LDL: 89 mg/dL

## 2021-03-14 NOTE — Progress Notes (Signed)
OFFICE VISIT  03/14/2021  CC:  Chief Complaint  Patient presents with   Follow-up    RCI, 5 month f/u Pt is fasting   HPI:    Patient is a 70 y.o. Caucasian male who presents for 5 mo f/u HTN, HLD, anxiety, and insomnia. A/P as of last visit: "1) HTN, well controlled on lisinopril '20mg'$  qd now. No labs needed.   2) HLD: tolerating pravastatin '80mg'$  qd. FLP and hepatic panel today.   3) Gout: no flares in quite a while, has colchicine for prn use, no prophylactic. Check uric acid level today.   4) GAD and anxiety-related insomnia: stable on xanax 0.'5mg'$  q6h and zolpidem 12.'5mg'$  qhs long term. CSC updated today. UDS today.  Should show alpraz and nothing else.   5) Preventative health care: Prevnar 13->given today.  Flu->pt declined.  Shingrix->will discuss next visit. Prostate ca screening: PSA today. Colon ca screening: next colonoscopy 2025"  INTERIM HX: Feeling well but just recently got to feeling back to normal after having had an acute infectious diarrhea illness. He lost about 3 lbs.  Also of note, he has not gotten his sense of smell or taste back since having covid Jan 2022. Says he rehydrated well, feels well now.  HTN: home bp's consistently normal.  HLD: trigs 289 last visit, similar to past measurements, TLC recommended + cont pravastatin 80 qd.   Insomnia stable long term on ambien cr 12.5 qd.   Anxiety stable long term on alpraz 0.'5mg'$  qid. PMP AWARE reviewed today: most recent rx for zolpidem was filled 03/03/21, #30 , rx by me. Most recent rx for alprazolam was filled 02/15/21, #120, rx by me. No red flags.  ROS as above, plus--> no fevers, no CP, no SOB, no wheezing, no cough, no dizziness, no HAs, no rashes, no melena/hematochezia.  No polyuria or polydipsia.  No myalgias or arthralgias.  No focal weakness, paresthesias, or tremors.  No acute vision or hearing abnormalities.  No dysuria or unusual/new urinary urgency or frequency.  No recent changes in lower  legs. No n/v/d or abd pain.  No palpitations.    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  MRI L-spine 2007 showed some bulging discs with nerve impingement.  Has never had back injection or surgery.   Contact dermatitis and eczema    COVID-19 virus infection 07/2020   Diverticulitis    Genital herpes 08/2015   HSV type I on culture   Gilbert syndrome    Gout    "always right big toe"--about one episode every 4 yrs.  Right ankle flare, 02/2016.   History of adenomatous polyp of colon 2005   History of bronchitis 08/11/2012   History of Clostridium difficile colitis 2016   HTN (hypertension)    started lisinopril 08/20/19   Hyperlipidemia, mixed    Statin started 04/2018   PUD (peptic ulcer disease)     Past Surgical History:  Procedure Laterality Date   COLONOSCOPY  07/2013   Normal.  Recall 10 yrs.   COLONOSCOPY W/ POLYPECTOMY  2005   Dr. Lyla Son    Outpatient Medications Prior to Visit  Medication Sig Dispense Refill   ALPRAZolam (XANAX) 0.5 MG tablet TAKE 1 TABLET BY MOUTH FOUR TIMES DAILY AS NEEDED FOR STRESS  120 tablet 5   Colchicine (MITIGARE) 0.6 MG CAPS 2 caps po at onset of gout flare, then take another 1 cap 1 hour later 30 capsule 2   fluticasone (CUTIVATE) 0.05 % cream Apply topically 2 (two) times daily. 30 g 3   fluticasone (FLONASE) 50 MCG/ACT nasal spray instill 2 sprays into each nostril once daily if needed for allergies 16 g 5   ibuprofen (ADVIL) 800 MG tablet TAKE 1 TABLET BY MOUTH THREE TIMES DAILY WITH FOOD AS NEEDED FOR PAIN 90 tablet 2   ketoconazole (NIZORAL) 2 % shampoo Apply to scalp every other day as needed for scalp seborrheic dermatitis 120 mL 11   lisinopril (ZESTRIL) 20 MG tablet Take 1 tablet (20 mg total) by mouth daily. 90 tablet 3    Multiple Vitamin (MULTIVITAMIN) tablet Take 1 tablet by mouth daily.     pantoprazole (PROTONIX) 40 MG tablet Take 1 tablet (40 mg total) by mouth every morning. 90 tablet 3   pravastatin (PRAVACHOL) 80 MG tablet Take 1 tablet (80 mg total) by mouth daily. 90 tablet 3   saccharomyces boulardii (FLORASTOR) 250 MG capsule Take 1 capsules twice daily. 60 capsule 3   zolpidem (AMBIEN CR) 12.5 MG CR tablet Take 1 tablet (12.5 mg total) by mouth at bedtime as needed. 30 tablet 5   No facility-administered medications prior to visit.    Allergies  Allergen Reactions   Prednisone Other (See Comments)    Insomnia, elevated BP and HR   Augmentin [Amoxicillin-Pot Clavulanate] Diarrhea    Upset stomach    ROS As per HPI  PE: Vitals with BMI 03/14/2021 02/23/2021 10/11/2020  Height - (No Data) '5\' 10"'$   Weight 171 lbs 13 oz (No Data) 175 lbs 3 oz  BMI - - 99991111  Systolic 0000000 (No Data) [id number]  Diastolic 82 (No Data) 67  Pulse 83 - 82   Gen: Alert, well appearing.  Patient is oriented to person, place, time, and situation. AFFECT: pleasant, lucid thought and speech. CV: RRR, no m/r/g.   LUNGS: CTA bilat, nonlabored resps, good aeration in all lung fields. EXT: no clubbing or cyanosis.  no edema.     LABS:  Lab Results  Component Value Date   TSH 3.74 04/22/2018   Lab Results  Component Value Date   WBC 6.5 09/13/2020   HGB 15.4 09/13/2020   HCT 43.9 09/13/2020   MCV 96.6 09/13/2020   PLT 251.0 09/13/2020   Lab Results  Component Value Date   CREATININE 0.86 09/13/2020   BUN 15 09/13/2020   NA 138 09/13/2020   K 4.3 09/13/2020   CL 103 09/13/2020   CO2 27 09/13/2020   Lab Results  Component Value Date   ALT 21 09/13/2020   AST 21 09/13/2020   ALKPHOS 80 09/13/2020   BILITOT 1.2 09/13/2020   Lab Results  Component Value Date   CHOL 215 (H) 09/13/2020   Lab Results  Component Value Date   HDL 55.60 09/13/2020   No results found for: [facility] Lab Results  Component  Value Date   TRIG 289.0 (H) 09/13/2020   Lab Results  Component Value Date   CHOLHDL 4 09/13/2020   Lab Results  Component Value Date   PSA 0.27 09/13/2020   PSA 0.23 08/20/2019   PSA 0.56 04/22/2018   Lab Results  Component Value Date   LABURIC 7.2 09/13/2020    IMPRESSION AND PLAN:  1) HTN, well controlled on lisinopril '20mg'$  qd. Lytes/cr today.  2) HLD: tolerating pravastatin '80mg'$  qd. FLP and hepatic panel today.  3) GAD: long term stability on alprazolam 0.'5mg'$  qid with appropriate use. CSC and UDS utd. New rx not needed today.  4) Insomnia: long term stability on ambien CR 12.'5mg'$  qhs. CSC and UDS utd. New rx not needed today.  An After Visit Summary was printed and given to the patient.  FOLLOW UP: No follow-ups on file.  Signed:  Crissie Sickles, MD           03/14/2021

## 2021-05-02 ENCOUNTER — Other Ambulatory Visit: Payer: Self-pay | Admitting: Family Medicine

## 2021-05-03 ENCOUNTER — Other Ambulatory Visit: Payer: Self-pay | Admitting: Family Medicine

## 2021-05-03 NOTE — Telephone Encounter (Signed)
Requesting: zolpidem Contract: 03/14/21 UDS: 09/13/20 Last Visit:03/14/21 Next Visit: 09/20/21 Last Refill: 11/01/20(30,5)  Please Advise. Medication pending

## 2021-06-16 ENCOUNTER — Other Ambulatory Visit: Payer: Self-pay | Admitting: Family Medicine

## 2021-06-16 NOTE — Telephone Encounter (Signed)
Requesting: Alprazolam Contract:03/14/21 UDS: 09/13/20 Last Visit:03/14/21 Next Visit: 09/20/21 Last Refill: 11/17/20(120,5)  Please Advise. Med pending

## 2021-06-18 DIAGNOSIS — J019 Acute sinusitis, unspecified: Secondary | ICD-10-CM | POA: Diagnosis not present

## 2021-06-18 DIAGNOSIS — J209 Acute bronchitis, unspecified: Secondary | ICD-10-CM | POA: Diagnosis not present

## 2021-07-05 ENCOUNTER — Other Ambulatory Visit: Payer: Self-pay | Admitting: Family Medicine

## 2021-09-07 ENCOUNTER — Other Ambulatory Visit: Payer: Self-pay

## 2021-09-07 MED ORDER — IBUPROFEN 800 MG PO TABS: ORAL_TABLET | ORAL | 0 refills | Status: DC

## 2021-09-19 ENCOUNTER — Other Ambulatory Visit: Payer: Self-pay

## 2021-09-20 ENCOUNTER — Encounter: Payer: Self-pay | Admitting: Family Medicine

## 2021-09-20 ENCOUNTER — Ambulatory Visit (INDEPENDENT_AMBULATORY_CARE_PROVIDER_SITE_OTHER): Payer: Medicare Other | Admitting: Family Medicine

## 2021-09-20 VITALS — BP 123/76 | HR 64 | Temp 97.5°F | Ht 71.0 in | Wt 176.4 lb

## 2021-09-20 DIAGNOSIS — E7800 Pure hypercholesterolemia, unspecified: Secondary | ICD-10-CM

## 2021-09-20 DIAGNOSIS — F411 Generalized anxiety disorder: Secondary | ICD-10-CM

## 2021-09-20 DIAGNOSIS — I10 Essential (primary) hypertension: Secondary | ICD-10-CM | POA: Diagnosis not present

## 2021-09-20 DIAGNOSIS — Z125 Encounter for screening for malignant neoplasm of prostate: Secondary | ICD-10-CM | POA: Diagnosis not present

## 2021-09-20 DIAGNOSIS — F5101 Primary insomnia: Secondary | ICD-10-CM

## 2021-09-20 DIAGNOSIS — Z79899 Other long term (current) drug therapy: Secondary | ICD-10-CM | POA: Diagnosis not present

## 2021-09-20 LAB — LDL CHOLESTEROL, DIRECT: Direct LDL: 122 mg/dL

## 2021-09-20 LAB — CBC WITH DIFFERENTIAL/PLATELET
Basophils Absolute: 0 10*3/uL (ref 0.0–0.1)
Basophils Relative: 0.3 % (ref 0.0–3.0)
Eosinophils Absolute: 1.7 10*3/uL — ABNORMAL HIGH (ref 0.0–0.7)
Eosinophils Relative: 21.3 % — ABNORMAL HIGH (ref 0.0–5.0)
HCT: 42.8 % (ref 39.0–52.0)
Hemoglobin: 14.7 g/dL (ref 13.0–17.0)
Lymphocytes Relative: 25.7 % (ref 12.0–46.0)
Lymphs Abs: 2 10*3/uL (ref 0.7–4.0)
MCHC: 34.4 g/dL (ref 30.0–36.0)
MCV: 96.3 fl (ref 78.0–100.0)
Monocytes Absolute: 0.6 10*3/uL (ref 0.1–1.0)
Monocytes Relative: 7.5 % (ref 3.0–12.0)
Neutro Abs: 3.6 10*3/uL (ref 1.4–7.7)
Neutrophils Relative %: 45.2 % (ref 43.0–77.0)
Platelets: 191 10*3/uL (ref 150.0–400.0)
RBC: 4.44 Mil/uL (ref 4.22–5.81)
RDW: 13.1 % (ref 11.5–15.5)
WBC: 8 10*3/uL (ref 4.0–10.5)

## 2021-09-20 LAB — COMPREHENSIVE METABOLIC PANEL
ALT: 18 U/L (ref 0–53)
AST: 18 U/L (ref 0–37)
Albumin: 4.6 g/dL (ref 3.5–5.2)
Alkaline Phosphatase: 82 U/L (ref 39–117)
BUN: 15 mg/dL (ref 6–23)
CO2: 27 mEq/L (ref 19–32)
Calcium: 9.3 mg/dL (ref 8.4–10.5)
Chloride: 102 mEq/L (ref 96–112)
Creatinine, Ser: 0.95 mg/dL (ref 0.40–1.50)
GFR: 82.23 mL/min (ref >60.00)
Glucose, Bld: 85 mg/dL (ref 70–99)
Potassium: 4.1 mEq/L (ref 3.5–5.1)
Sodium: 138 mEq/L (ref 135–145)
Total Bilirubin: 1 mg/dL (ref 0.2–1.2)
Total Protein: 6.5 g/dL (ref 6.0–8.3)

## 2021-09-20 LAB — LIPID PANEL
Cholesterol: 200 mg/dL (ref 0–200)
HDL: 51.4 mg/dL (ref >39.00)
NonHDL: 148.31
Total CHOL/HDL Ratio: 4
Triglycerides: 242 mg/dL — ABNORMAL HIGH (ref 0.0–149.0)
VLDL: 48.4 mg/dL — ABNORMAL HIGH (ref 0.0–40.0)

## 2021-09-20 LAB — PSA, MEDICARE: PSA: 0.18 ng/ml (ref 0.10–4.00)

## 2021-09-20 MED ORDER — COLCHICINE 0.6 MG PO CAPS: ORAL_CAPSULE | ORAL | 2 refills | Status: DC

## 2021-09-20 MED ORDER — KETOCONAZOLE 2 % EX SHAM: MEDICATED_SHAMPOO | CUTANEOUS | 11 refills | Status: DC

## 2021-09-20 MED ORDER — ZOLPIDEM TARTRATE ER 12.5 MG PO TBCR: EXTENDED_RELEASE_TABLET | ORAL | 5 refills | Status: DC

## 2021-09-20 MED ORDER — FLUTICASONE PROPIONATE 0.05 % EX CREA
TOPICAL_CREAM | Freq: Two times a day (BID) | CUTANEOUS | 3 refills | Status: DC
Start: 2021-09-20 — End: 2023-11-07

## 2021-09-20 MED ORDER — IBUPROFEN 800 MG PO TABS: ORAL_TABLET | ORAL | 5 refills | Status: DC

## 2021-09-20 MED ORDER — TETANUS-DIPHTH-ACELL PERTUSSIS 5-2-15.5 LF-MCG/0.5 IM SUSP: 0.5000 mL | Freq: Once | INTRAMUSCULAR | 0 refills | Status: AC

## 2021-09-20 MED ORDER — PRAVASTATIN SODIUM 80 MG PO TABS: 80.0000 mg | ORAL_TABLET | Freq: Every day | ORAL | 1 refills | Status: DC

## 2021-09-20 MED ORDER — LISINOPRIL 20 MG PO TABS: 20.0000 mg | ORAL_TABLET | Freq: Every day | ORAL | 1 refills | Status: DC

## 2021-09-20 NOTE — Patient Instructions (Signed)

## 2021-09-20 NOTE — Progress Notes (Signed)
Office Note 09/20/2021  CC: No chief complaint on file.   Patient is a 71 y.o. male who is here for 6 mo f/u f/u HTN, HLD, anxiety, and insomnia. A/P as of last visit: "1) HTN, well controlled on lisinopril '20mg'$  qd. Lytes/cr today.   2) HLD: tolerating pravastatin '80mg'$  qd. FLP and hepatic panel today.   3) GAD: long term stability on alprazolam 0.'5mg'$  qid with appropriate use. CSC and UDS utd. New rx not needed today.   4) Insomnia: long term stability on ambien CR 12.'5mg'$  qhs. CSC and UDS utd. New rx not needed today"  INTERIM HX:  Antony Haste feels well. Still working full-time, thrives on this. Hangs out with his young grandkids regularly. Gets about 4 miles of walking in every day at his job.  Taking ibup 1-2 tabs every day for chronic LBP and it helps well.  Takes Ambien CR 12.5 mg nightly and this helps well with his primary insomnia. Takes alprazolam 0.5 mg 4 times daily as needed long-term for his anxiety--this is stable. PMP AWARE reviewed today:  No red flags.  Home blood pressures consistently normal.  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  MRI L-spine 2007 showed some bulging discs with nerve impingement.  Has never had back injection or surgery.   Contact dermatitis and eczema    COVID-19 virus infection 07/2020   Diverticulitis    Genital herpes 08/2015   HSV type I on culture   Gilbert syndrome    Gout    "always right big toe"--about one episode every 4 yrs.  Right ankle flare, 02/2016.   History of adenomatous polyp of colon 2005   History of bronchitis 08/11/2012   History of Clostridium difficile colitis 2016   HTN (hypertension)    started lisinopril 08/20/19   Hyperlipidemia, mixed    Statin started 04/2018   PUD (peptic ulcer disease)      Past Surgical History:  Procedure Laterality Date   COLONOSCOPY  07/2013   Normal.  Recall 10 yrs.   COLONOSCOPY W/ POLYPECTOMY  2005   Dr. Lyla Son    Family History  Problem Relation Age of Onset   Heart attack Father 45   Heart disease Father        MI age 27   Heart disease Other    Colon polyps Mother    Ovarian cancer Sister    Colon cancer Neg Hx     Social History   Socioeconomic History   Marital status: Married    Spouse name: Not on file   Number of children: 3   Years of education: Not on file   Highest education level: Not on file  Occupational History   Occupation: sales  Tobacco Use   Smoking status: Never   Smokeless tobacco: Never  Vaping Use   Vaping Use: Never used  Substance and Sexual Activity   Alcohol use: Yes    Alcohol/week: 14.0 standard drinks    Types: 14 Cans of beer per week   Drug use: No   Sexual activity: Yes  Other Topics Concern   Not on file  Social History Narrative   Married, 3 grown children.   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   Lived in [place] area all his life.  No T/A/Ds.   Walks 3-4 times per day.   Social Determinants of Health   Financial Resource Strain: Low Risk    Difficulty of Paying Living Expenses: Not hard at all  Food Insecurity: No Food Insecurity   Worried About Charity fundraiser in the Last Year: Never true   [place] in the Last Year: Never true  Transportation Needs: No Transportation Needs   Lack of Transportation (Medical): No   Lack of Transportation (Non-Medical): No  Physical Activity: Sufficiently Active   Days of Exercise per Week: 5 days   Minutes of Exercise per Session: 40 min  Stress: No Stress Concern Present   Feeling of Stress : Not at all  Social Connections: Moderately Integrated   Frequency of Communication with Friends and Family: More than three times a week   Frequency of Social Gatherings with Friends and Family: More than three times a week   Attends  Religious Services: 1 to 4 times per year   Active Member of [employer] or Organizations: No   Attends Music therapist: Never   Marital Status: Married  Human resources officer Violence: Not At Risk   Fear of Current or Ex-Partner: No   Emotionally Abused: No   Physically Abused: No   Sexually Abused: No    Outpatient Medications Prior to Visit  Medication Sig Dispense Refill   ALPRAZolam (XANAX) 0.5 MG tablet TAKE 1 TABLET BY MOUTH FOUR TIMES DAILY AS NEEDED FOR STRESS 120 tablet 5   Multiple Vitamin (MULTIVITAMIN) tablet Take 1 tablet by mouth daily.     pantoprazole (PROTONIX) 40 MG tablet TAKE 1 TABLET(40 MG) BY MOUTH EVERY MORNING 90 tablet 3   saccharomyces boulardii (FLORASTOR) 250 MG capsule Take 1 capsules twice daily. 60 capsule 3   fluticasone (CUTIVATE) 0.05 % cream Apply topically 2 (two) times daily. 30 g 3   ketoconazole (NIZORAL) 2 % shampoo Apply to scalp every other day as needed for scalp seborrheic dermatitis 120 mL 11   Colchicine (MITIGARE) 0.6 MG CAPS 2 caps po at onset of gout flare, then take another 1 cap 1 hour later 30 capsule 2   fluticasone (FLONASE) 50 MCG/ACT nasal spray instill 2 sprays into each nostril once daily if needed for allergies 16 g 5   ibuprofen (ADVIL) 800 MG tablet TAKE 1 TABLET BY MOUTH THREE TIMES DAILY WITH FOOD AS NEEDED FOR PAIN 90 tablet 0   lisinopril (ZESTRIL) 20 MG tablet Take 1 tablet (20 mg total) by mouth daily. 90 tablet 3   pravastatin (PRAVACHOL) 80 MG tablet Take 1 tablet (80 mg total) by mouth daily. 90 tablet 3   zolpidem (AMBIEN CR) 12.5 MG CR tablet TAKE 1 TABLET(12.5 MG) BY MOUTH AT BEDTIME AS NEEDED 30 tablet 5   No facility-administered medications prior to visit.    Allergies  Allergen Reactions   Prednisone Other (See Comments)    Insomnia, elevated BP and HR   Augmentin [Amoxicillin-Pot Clavulanate] Diarrhea    Upset stomach    ROS Review of Systems  Constitutional:  Negative for appetite change, chills,  fatigue and fever.  HENT:  Negative for congestion, dental problem, ear pain and sore throat.   Eyes:  Negative for discharge, redness and visual disturbance.  Respiratory:  Negative for cough, chest tightness, shortness of breath and wheezing.   Cardiovascular:  Negative for chest pain, palpitations and leg swelling.  Gastrointestinal:  Negative for abdominal pain, blood in stool, diarrhea, nausea and vomiting.  Genitourinary:  Negative for difficulty urinating, dysuria, flank pain, frequency, hematuria and urgency.  Musculoskeletal:  Negative for arthralgias, back pain, joint swelling, myalgias and neck stiffness.  Skin:  Negative for pallor and rash.  Neurological:  Negative for dizziness, speech difficulty, weakness and headaches.  Hematological:  Negative for adenopathy. Does not bruise/bleed easily.  Psychiatric/Behavioral:  Negative for confusion and sleep disturbance. The patient is not nervous/anxious.    PE; Vitals with BMI 09/20/2021 03/14/2021 02/23/2021  Height '5\' 11"'$  - (No Data)  Weight 176 lbs 6 oz 171 lbs 13 oz (No Data)  BMI 02.77 - -  Systolic 412 878 (No Data)  Diastolic 76 82 (No Data)  Pulse 64 83 -   Gen: Alert, well appearing.  Patient is oriented to person, place, time, and situation. AFFECT: pleasant, lucid thought and speech. ENT: Ears: EACs clear, normal epithelium.  TMs with good light reflex and landmarks bilaterally.  Eyes: no injection, icteris, swelling, or exudate.  EOMI, PERRLA. Nose: no drainage or turbinate edema/swelling.  No injection or focal lesion.  Mouth: lips without lesion/swelling.  Oral mucosa pink and moist.  Dentition intact and without obvious caries or gingival swelling.  Oropharynx without erythema, exudate, or swelling.  Neck: supple/nontender.  No LAD, mass, or TM.  Carotid pulses 2+ bilaterally, without bruits. CV: RRR, no m/r/g.   LUNGS: CTA bilat, nonlabored resps, good aeration in all lung fields. ABD: soft, NT, ND, BS normal.  No  hepatospenomegaly or mass.  No bruits. EXT: no clubbing, cyanosis, or edema.  Musculoskeletal: no joint swelling, erythema, warmth, or tenderness.  ROM of all joints intact. Skin - no sores or suspicious lesions or rashes or color changes  Pertinent labs:  Lab Results  Component Value Date   TSH 3.74 04/22/2018   Lab Results  Component Value Date   WBC 6.5 09/13/2020   HGB 15.4 09/13/2020   HCT 43.9 09/13/2020   MCV 96.6 09/13/2020   PLT 251.0 09/13/2020   Lab Results  Component Value Date   CREATININE 0.96 03/14/2021   BUN 14 03/14/2021   NA 141 03/14/2021   K 4.1 03/14/2021   CL 106 03/14/2021   CO2 25 03/14/2021   Lab Results  Component Value Date   ALT 22 03/14/2021   AST 16 03/14/2021   ALKPHOS 68 03/14/2021   BILITOT 1.2 03/14/2021   Lab Results  Component Value Date   CHOL 164 03/14/2021   Lab Results  Component Value Date   HDL 49.20 03/14/2021   No results found for: [facility] Lab Results  Component Value Date   TRIG 208.0 (H) 03/14/2021   Lab Results  Component Value Date   CHOLHDL 3 03/14/2021   Lab Results  Component Value Date   PSA 0.27 09/13/2020   PSA 0.23 08/20/2019   PSA 0.56 04/22/2018   ASSESSMENT AND PLAN:   #1 hypertension, well controlled.  Continue lisinopril 20 mg a day. Electrolytes and creatinine today.  #2 Hyperlipidemia, doing well on Pravachol 80 mg a day. Lipid panel and hepatic panel today.  #3 primary insomnia: Doing well long-term on Ambien CR 12.5 mg every night. UDS today.  4.  Chronic anxiety: Stable on long-term use of 0.5 mg alprazolam every 6 hours as needed. Urine drug screen today.  5. Preventative health care: Vaccines: Shingrix->declined.  Tdap->rx to pharmacy. Otherwise all utd. Labs: cbc,cmet, lipids, psa Prostate ca screening: PSA today Colon ca screening: recall 2025  An After Visit Summary was printed and given to  the patient.  FOLLOW UP:  Return in about 6 months (around 03/23/2022) for  routine chronic illness f/u.  Signed:  Crissie Sickles, MD           09/20/2021

## 2021-09-23 LAB — DM TEMPLATE

## 2021-09-24 LAB — DRUG MONITORING PANEL 376104, URINE

## 2021-09-24 LAB — DM TEMPLATE

## 2021-11-15 DIAGNOSIS — J019 Acute sinusitis, unspecified: Secondary | ICD-10-CM | POA: Diagnosis not present

## 2021-12-19 ENCOUNTER — Other Ambulatory Visit: Payer: Self-pay

## 2021-12-19 MED ORDER — ALPRAZOLAM 0.5 MG PO TABS: ORAL_TABLET | ORAL | 5 refills | Status: DC

## 2021-12-19 NOTE — Telephone Encounter (Signed)
Requesting: alprazolam Contract: 03/14/21 UDS: 09/13/20 Last Visit: 09/20/21 Next Visit: 04/03/22 Last Refill: 06/16/21(120,5)  Please Advise. Med pending

## 2022-03-01 ENCOUNTER — Ambulatory Visit (INDEPENDENT_AMBULATORY_CARE_PROVIDER_SITE_OTHER): Payer: Medicare Other

## 2022-03-01 DIAGNOSIS — Z0000 Encounter for general adult medical examination without abnormal findings: Secondary | ICD-10-CM

## 2022-03-01 NOTE — Progress Notes (Signed)
Virtual Visit via Telephone Note  I connected with  Keith Sanders on 03/01/22 at  8:30 AM EDT by telephone and verified that I am speaking with the correct person using two identifiers.  Medicare Annual Wellness visit completed telephonically due to Covid-19 pandemic.   Persons participating in this call: This Health Coach and this patient.   Location: Patient: home Provider: office    I discussed the limitations, risks, security and privacy concerns of performing an evaluation and management service by telephone and the availability of in person appointments. The patient expressed understanding and agreed to proceed.  Unable to perform video visit due to video visit attempted and failed and/or patient does not have video capability.   Some vital signs may be absent or patient reported.   Willette Brace, LPN   Subjective:   Keith Sanders is a 71 y.o. male who presents for Medicare Annual/Subsequent preventive examination.  Review of Systems     Cardiac Risk Factors include: advanced age (>63mn, >>35women);hypertension;male gender     Objective:    There were no vitals filed for this visit. There is no height or weight on file to calculate BMI.     03/01/2022    8:41 AM 02/23/2021    8:37 AM 03/06/2015    7:46 AM 06/17/2014   10:13 AM  Advanced Directives  Does Patient Have a Medical Advance Directive? Yes Yes No No  [place] Living will    Copy of [place]? No - copy requested     Would patient like information on creating a medical advance directive?   No - patient declined information     Current Medications (verified) Outpatient Encounter Medications as of 03/01/2022  Medication Sig   ALPRAZolam (XANAX) 0.5 MG tablet TAKE 1 TABLET BY MOUTH FOUR TIMES DAILY AS NEEDED FOR STRESS   Colchicine (MITIGARE) 0.6 MG CAPS 2 caps po at onset of gout flare, then take another 1 cap 1 hour later    fluticasone (CUTIVATE) 0.05 % cream Apply topically 2 (two) times daily.   ibuprofen (ADVIL) 800 MG tablet TAKE 1 TABLET BY MOUTH THREE TIMES DAILY WITH FOOD AS NEEDED FOR PAIN   ketoconazole (NIZORAL) 2 % shampoo Apply to scalp every other day as needed for scalp seborrheic dermatitis   lisinopril (ZESTRIL) 20 MG tablet Take 1 tablet (20 mg total) by mouth daily.   Multiple Vitamin (MULTIVITAMIN) tablet Take 1 tablet by mouth daily.   pantoprazole (PROTONIX) 40 MG tablet TAKE 1 TABLET(40 MG) BY MOUTH EVERY MORNING   pravastatin (PRAVACHOL) 80 MG tablet Take 1 tablet (80 mg total) by mouth daily.   saccharomyces boulardii (FLORASTOR) 250 MG capsule Take 1 capsules twice daily.   zolpidem (AMBIEN CR) 12.5 MG CR tablet TAKE 1 TABLET(12.5 MG) BY MOUTH AT BEDTIME AS NEEDED   No facility-administered encounter medications on file as of 03/01/2022.    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  MRI L-spine 2007 showed some bulging discs with nerve impingement.  Has never had back injection or surgery.   Contact dermatitis and eczema    COVID-19 virus infection 07/2020  Diverticulitis    Genital herpes 08/2015   HSV type I on culture   Gilbert syndrome    Gout    "always right big toe"--about one episode every 4 yrs.  Right ankle flare, 02/2016.   History of adenomatous polyp of colon 2005   History of bronchitis 08/11/2012   History of Clostridium difficile colitis 2016   HTN (hypertension)    started lisinopril 08/20/19   Hyperlipidemia, mixed    Statin started 04/2018   PUD (peptic ulcer disease)    Past Surgical History:  Procedure Laterality Date   COLONOSCOPY  07/2013   Normal.  Recall  10 yrs.   COLONOSCOPY W/ POLYPECTOMY  2005   Dr. Lyla Son   Family History  Problem Relation Age of Onset   Heart attack Father 33   Heart disease Father        MI age 40   Heart disease Other    Colon polyps Mother    Ovarian cancer Sister    Colon cancer Neg Hx    Social History   Socioeconomic History   Marital status: Married    Spouse name: Not on file   Number of children: 3   Years of education: Not on file   Highest education level: Not on file  Occupational History   Occupation: sales  Tobacco Use   Smoking status: Never   Smokeless tobacco: Never  Vaping Use   Vaping Use: Never used  Substance and Sexual Activity   Alcohol use: Yes    Alcohol/week: 14.0 standard drinks of alcohol    Types: 14 Cans of beer per week   Drug use: No   Sexual activity: Yes  Other Topics Concern   Not on file  Social History Narrative   Married, 3 grown children.   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   Lived in [place] area all his life.   No T/A/Ds.   Walks 3-4 times per day.   Social Determinants of Health   Financial Resource Strain: Low Risk  (03/01/2022)   Overall Financial Resource Strain (CARDIA)    Difficulty of Paying Living Expenses: Not hard at all  Food Insecurity: No Food Insecurity (03/01/2022)   Hunger Vital Sign    Worried About Running Out of Food in the Last Year: Never true    Ran Out of Food in the Last Year: Never true  Transportation Needs: No Transportation Needs (03/01/2022)   PRAPARE - Hydrologist (Medical): No    Lack of Transportation (Non-Medical): No  Physical Activity: Sufficiently Active (03/01/2022)   Exercise Vital Sign    Days of Exercise per Week: 5 days    Minutes of Exercise per Session: 60 min  Stress: No Stress Concern Present (03/01/2022)   [place]    Feeling of Stress : Not at all  Social Connections: Moderately Integrated  (03/01/2022)   Social Connection and Isolation Panel [NHANES]    Frequency of Communication with Friends and Family: More than three times a week    Frequency of Social Gatherings with Friends and Family: More than three times a week    Attends Religious Services: 1 to 4 times per year    Active Member of [employer] or Organizations: No    Attends Archivist Meetings: Never    Marital Status: Married    Tobacco Counseling Counseling given: Not Answered   Clinical Intake:  Pre-visit preparation completed: Yes  Pain : No/denies pain     BMI - recorded: 24.61 Nutritional Status: BMI of 19-24  Normal Diabetes: No  How often do you need to have someone help you when you read instructions, pamphlets, or other written materials from your doctor or pharmacy?: 1 - Never  Diabetic?no  Interpreter Needed?: No  Information entered by :: Charlott Rakes, LPN   Activities of Daily Living    03/01/2022    8:42 AM  In your present state of health, do you have any difficulty performing the following activities:  Hearing? 0  Vision? 0  Difficulty concentrating or making decisions? 0  Walking or climbing stairs? 0  Dressing or bathing? 0  Doing errands, shopping? 0  Preparing Food and eating ? N  Using the Toilet? N  In the past six months, have you accidently leaked urine? N  Do you have problems with loss of bowel control? N  Managing your Medications? N  Managing your Finances? N  Housekeeping or managing your Housekeeping? N    Patient Care Team: Tammi Sou, MD as PCP - General (Family Medicine) Ashok Pall, MD as Consulting Physician (Neurosurgery) Rosemarie Ax, MD as Consulting Physician (Sports Medicine)  Indicate any recent Medical Services you may have received from other than [facility] providers in the past year (date may be approximate).     Assessment:   This is a routine wellness examination for [employer].  Hearing/Vision screen Hearing Screening -  Comments:: Pt denies any hearing issues  Vision Screening - Comments:: Pt follows up with Dr Sabra Heck for eye exams   Dietary issues and exercise activities discussed: Current Exercise Habits: Home exercise routine, Type of exercise: walking, Time (Minutes): 60, Frequency (Times/Week): 5, Weekly Exercise (Minutes/Week): 300   Goals Addressed             This Visit's Progress    Patient Stated       Stay healthy        Depression Screen    03/01/2022    8:40 AM 02/23/2021    8:36 AM 09/13/2020    8:30 AM 03/11/2020    8:06 AM 01/16/2019    8:17 AM 07/22/2018    9:14 AM 04/22/2018    8:14 AM  PHQ 2/9 Scores  PHQ - 2 Score 0 0 0 0 0 0 0    Fall Risk    03/01/2022    8:41 AM 02/23/2021    8:38 AM 09/13/2020    8:30 AM 03/11/2020    8:06 AM 01/16/2019    8:17 AM  Fall Risk   Falls in the past year? 0 0 0 0 0  Number falls in past yr: 0 0 0 0 0  Injury with Fall? 0 0 0 0 0  Follow up Falls prevention discussed Falls evaluation completed;Falls prevention discussed Falls evaluation completed Falls evaluation completed Falls evaluation completed    FALL RISK PREVENTION PERTAINING TO THE HOME:  Any stairs in or around the home? Yes  If so, are there any without handrails? No  Home free of loose throw rugs in walkways, pet beds, electrical cords, etc? Yes  Adequate lighting in your home to reduce risk of falls? Yes   ASSISTIVE DEVICES UTILIZED TO PREVENT FALLS:  Life alert? No  Use of a cane, walker or w/c? No  Grab bars in the bathroom? No  Shower chair or bench in shower? Yes  Elevated toilet seat or a handicapped toilet? No   TIMED UP AND  GO:  Was the test performed? No .    Cognitive Function:        03/01/2022    8:43 AM  6CIT Screen  What Year? 0 points  What month? 0 points  What time? 0 points  Count back from 20 0 points  Months in reverse 0 points  Repeat phrase 0 points  Total Score 0 points    Immunizations Immunization History  Administered Date(s)  Administered   PFIZER(Purple Top)SARS-COV-2 Vaccination 11/28/2019, 12/19/2019   Pneumococcal Conjugate-13 09/13/2020   Pneumococcal Polysaccharide-23 04/22/2018, 08/20/2019   Tdap 07/20/2011    TDAP status: Due, Education has been provided regarding the importance of this vaccine. Advised may receive this vaccine at local pharmacy or Health Dept. Aware to provide a copy of the vaccination record if obtained from local pharmacy or Health Dept. Verbalized acceptance and understanding.  Flu Vaccine status: Declined, Education has been provided regarding the importance of this vaccine but patient still declined. Advised may receive this vaccine at local pharmacy or Health Dept. Aware to provide a copy of the vaccination record if obtained from local pharmacy or Health Dept. Verbalized acceptance and understanding.  Pneumococcal vaccine status: Up to date  Covid-19 vaccine status: Completed vaccines  Qualifies for Shingles Vaccine? Yes   Zostavax completed No   Shingrix Completed?: No.    Education has been provided regarding the importance of this vaccine. Patient has been advised to call insurance company to determine out of pocket expense if they have not yet received this vaccine. Advised may also receive vaccine at local pharmacy or Health Dept. Verbalized acceptance and understanding.  Screening Tests Health Maintenance  Topic Date Due   Zoster Vaccines- Shingrix (1 of 2) Never done   COVID-19 Vaccine (3 - Pfizer risk series) 01/16/2020   TETANUS/TDAP  07/19/2021   INFLUENZA VACCINE  02/14/2022   COLONOSCOPY (Pts 45-42yr Insurance coverage will need to be confirmed)  08/13/2023   Pneumonia Vaccine 71 Years old  Completed   HPV VACCINES  Aged Out   Hepatitis C Screening  Discontinued    Health Maintenance  Health Maintenance Due  Topic Date Due   Zoster Vaccines- Shingrix (1 of 2) Never done   COVID-19 Vaccine (3 - Pfizer risk series) 01/16/2020   TETANUS/TDAP  07/19/2021    INFLUENZA VACCINE  02/14/2022    Colorectal cancer screening: Type of screening: Colonoscopy. Completed 08/12/13. Repeat every 10 years    Additional Screening:  Hepatitis C Screening: does not qualify;   Vision Screening: Recommended annual ophthalmology exams for early detection of glaucoma and other disorders of the eye. Is the patient up to date with their annual eye exam?  No  Who is the provider or what is the name of the office in which the patient attends annual eye exams? Dr MSabra Heck If pt is not established with a provider, would they like to be referred to a provider to establish care? No .   Dental Screening: Recommended annual dental exams for proper oral hygiene  Community Resource Referral / Chronic Care Management: CRR required this visit?  No   CCM required this visit?  No      Plan:     I have personally reviewed and noted the following in the patient's chart:   Medical and social history Use of alcohol, tobacco or illicit drugs  Current medications and supplements including opioid prescriptions. Patient is not currently taking opioid prescriptions. Functional ability and status Nutritional status Physical activity Advanced directives  List of other physicians Hospitalizations, surgeries, and ER visits in previous 12 months Vitals Screenings to include cognitive, depression, and falls Referrals and appointments  In addition, I have reviewed and discussed with patient certain preventive protocols, quality metrics, and best practice recommendations. A written personalized care plan for preventive services as well as general preventive health recommendations were provided to patient.     Willette Brace, LPN   [id number]/92/[id number]   Nurse Notes: none

## 2022-03-01 NOTE — Patient Instructions (Signed)
[name] , Thank you for taking time to come for your Medicare Wellness Visit. I appreciate your ongoing commitment to your health goals. Please review the following plan we discussed and let me know if I can assist you in the future.   Screening recommendations/referrals: Colonoscopy: done 08/12/13 repeat every 10 years  Recommended yearly ophthalmology/optometry visit for glaucoma screening and checkup Recommended yearly dental visit for hygiene and checkup  Vaccinations: Influenza vaccine: declined  Pneumococcal vaccine: Up to date Tdap vaccine: due and discussed  Shingles vaccine: Shingrix discussed. Please contact your pharmacy for coverage information.    Covid-19: completed 5/14, & 12/19/19  Advanced directives: Please bring a copy of your health care power of attorney and living will to the office at your convenience.  Conditions/risks identified: stay healthy and active   Next appointment: Follow up in one year for your annual wellness visit.   Preventive Care 46 Years and Older, Male Preventive care refers to lifestyle choices and visits with your health care provider that can promote health and wellness. What does preventive care include? A yearly physical exam. This is also called an annual well check. Dental exams once or twice a year. Routine eye exams. Ask your health care provider how often you should have your eyes checked. Personal lifestyle choices, including: Daily care of your teeth and gums. Regular physical activity. Eating a healthy diet. Avoiding tobacco and drug use. Limiting alcohol use. Practicing safe sex. Taking low doses of aspirin every day. Taking vitamin and mineral supplements as recommended by your health care provider. What happens during an annual well check? The services and screenings done by your health care provider during your annual well check will depend on your age, overall health, lifestyle risk factors, and family history of  disease. Counseling  Your health care provider may ask you questions about your: Alcohol use. Tobacco use. Drug use. Emotional well-being. Home and relationship well-being. Sexual activity. Eating habits. History of falls. Memory and ability to understand (cognition). Work and work Statistician. Screening  You may have the following tests or measurements: Height, weight, and BMI. Blood pressure. Lipid and cholesterol levels. These may be checked every 5 years, or more frequently if you are over 46 years old. Skin check. Lung cancer screening. You may have this screening every year starting at age 51 if you have a 30-pack-year history of smoking and currently smoke or have quit within the past 15 years. Fecal occult blood test (FOBT) of the stool. You may have this test every year starting at age 55. Flexible sigmoidoscopy or colonoscopy. You may have a sigmoidoscopy every 5 years or a colonoscopy every 10 years starting at age 56. Prostate cancer screening. Recommendations will vary depending on your family history and other risks. Hepatitis C blood test. Hepatitis B blood test. Sexually transmitted disease (STD) testing. Diabetes screening. This is done by checking your blood sugar (glucose) after you have not eaten for a while (fasting). You may have this done every 1-3 years. Abdominal aortic aneurysm (AAA) screening. You may need this if you are a current or former smoker. Osteoporosis. You may be screened starting at age 2 if you are at high risk. Talk with your health care provider about your test results, treatment options, and if necessary, the need for more tests. Vaccines  Your health care provider may recommend certain vaccines, such as: Influenza vaccine. This is recommended every year. Tetanus, diphtheria, and acellular pertussis (Tdap, Td) vaccine. You may need a Td booster  every 10 years. Zoster vaccine. You may need this after age 23. Pneumococcal 13-valent  conjugate (PCV13) vaccine. One dose is recommended after age 1. Pneumococcal polysaccharide (PPSV23) vaccine. One dose is recommended after age 11. Talk to your health care provider about which screenings and vaccines you need and how often you need them. This information is not intended to replace advice given to you by your health care provider. Make sure you discuss any questions you have with your health care provider. Document Released: 07/30/2015 Document Revised: 03/22/2016 Document Reviewed: 05/04/2015 Elsevier Interactive Patient Education  2017 [place] Prevention in the Home Falls can cause injuries. They can happen to people of all ages. There are many things you can do to make your home safe and to help prevent falls. What can I do on the outside of my home? Regularly fix the edges of walkways and driveways and fix any cracks. Remove anything that might make you trip as you walk through a door, such as a raised step or threshold. Trim any bushes or trees on the path to your home. Use bright outdoor lighting. Clear any walking paths of anything that might make someone trip, such as rocks or tools. Regularly check to see if handrails are loose or broken. Make sure that both sides of any steps have handrails. Any raised decks and porches should have guardrails on the edges. Have any leaves, snow, or ice cleared regularly. Use sand or salt on walking paths during winter. Clean up any spills in your garage right away. This includes oil or grease spills. What can I do in the bathroom? Use night lights. Install grab bars by the toilet and in the tub and shower. Do not use towel bars as grab bars. Use non-skid mats or decals in the tub or shower. If you need to sit down in the shower, use a plastic, non-slip stool. Keep the floor dry. Clean up any water that spills on the floor as soon as it happens. Remove soap buildup in the tub or shower regularly. Attach bath mats  securely with double-sided non-slip rug tape. Do not have throw rugs and other things on the floor that can make you trip. What can I do in the bedroom? Use night lights. Make sure that you have a light by your bed that is easy to reach. Do not use any sheets or blankets that are too big for your bed. They should not hang down onto the floor. Have a firm chair that has side arms. You can use this for support while you get dressed. Do not have throw rugs and other things on the floor that can make you trip. What can I do in the kitchen? Clean up any spills right away. Avoid walking on wet floors. Keep items that you use a lot in easy-to-reach places. If you need to reach something above you, use a strong step stool that has a grab bar. Keep electrical cords out of the way. Do not use floor polish or wax that makes floors slippery. If you must use wax, use non-skid floor wax. Do not have throw rugs and other things on the floor that can make you trip. What can I do with my stairs? Do not leave any items on the stairs. Make sure that there are handrails on both sides of the stairs and use them. Fix handrails that are broken or loose. Make sure that handrails are as long as the stairways. Check any carpeting to  make sure that it is firmly attached to the stairs. Fix any carpet that is loose or worn. Avoid having throw rugs at the top or bottom of the stairs. If you do have throw rugs, attach them to the floor with carpet tape. Make sure that you have a light switch at the top of the stairs and the bottom of the stairs. If you do not have them, ask someone to add them for you. What else can I do to help prevent falls? Wear shoes that: Do not have high heels. Have rubber bottoms. Are comfortable and fit you well. Are closed at the toe. Do not wear sandals. If you use a stepladder: Make sure that it is fully opened. Do not climb a closed stepladder. Make sure that both sides of the stepladder  are locked into place. Ask someone to hold it for you, if possible. Clearly Jansel and make sure that you can see: Any grab bars or handrails. First and last steps. Where the edge of each step is. Use tools that help you move around (mobility aids) if they are needed. These include: Canes. Walkers. Scooters. Crutches. Turn on the lights when you go into a dark area. Replace any light bulbs as soon as they burn out. Set up your furniture so you have a clear path. Avoid moving your furniture around. If any of your floors are uneven, fix them. If there are any pets around you, be aware of where they are. Review your medicines with your doctor. Some medicines can make you feel dizzy. This can increase your chance of falling. Ask your doctor what other things that you can do to help prevent falls. This information is not intended to replace advice given to you by your health care provider. Make sure you discuss any questions you have with your health care provider. Document Released: 04/29/2009 Document Revised: 12/09/2015 Document Reviewed: 08/07/2014 Elsevier Interactive Patient Education  2017 [employer].

## 2022-04-03 ENCOUNTER — Ambulatory Visit (INDEPENDENT_AMBULATORY_CARE_PROVIDER_SITE_OTHER): Payer: Medicare Other | Admitting: Family Medicine

## 2022-04-03 ENCOUNTER — Encounter: Payer: Self-pay | Admitting: Family Medicine

## 2022-04-03 VITALS — BP 144/82 | HR 74 | Temp 98.2°F | Ht 71.0 in | Wt 173.4 lb

## 2022-04-03 DIAGNOSIS — F411 Generalized anxiety disorder: Secondary | ICD-10-CM | POA: Diagnosis not present

## 2022-04-03 DIAGNOSIS — F5101 Primary insomnia: Secondary | ICD-10-CM

## 2022-04-03 DIAGNOSIS — I10 Essential (primary) hypertension: Secondary | ICD-10-CM | POA: Diagnosis not present

## 2022-04-03 DIAGNOSIS — E7800 Pure hypercholesterolemia, unspecified: Secondary | ICD-10-CM

## 2022-04-03 DIAGNOSIS — Z23 Encounter for immunization: Secondary | ICD-10-CM

## 2022-04-03 LAB — COMPREHENSIVE METABOLIC PANEL
ALT: 17 U/L (ref 0–53)
AST: 19 U/L (ref 0–37)
Albumin: 4.2 g/dL (ref 3.5–5.2)
Alkaline Phosphatase: 77 U/L (ref 39–117)
BUN: 15 mg/dL (ref 6–23)
CO2: 24 mEq/L (ref 19–32)
Calcium: 8.9 mg/dL (ref 8.4–10.5)
Chloride: 106 mEq/L (ref 96–112)
Creatinine, Ser: 0.95 mg/dL (ref 0.40–1.50)
GFR: 81.92 mL/min (ref >60.00)
Glucose, Bld: 104 mg/dL — ABNORMAL HIGH (ref 70–99)
Potassium: 3.9 mEq/L (ref 3.5–5.1)
Sodium: 141 mEq/L (ref 135–145)
Total Bilirubin: 1.4 mg/dL — ABNORMAL HIGH (ref 0.2–1.2)
Total Protein: 6.4 g/dL (ref 6.0–8.3)

## 2022-04-03 LAB — LIPID PANEL
Cholesterol: 184 mg/dL (ref 0–200)
HDL: 51.6 mg/dL (ref >39.00)
NonHDL: 132.84
Total CHOL/HDL Ratio: 4
Triglycerides: 259 mg/dL — ABNORMAL HIGH (ref 0.0–149.0)
VLDL: 51.8 mg/dL — ABNORMAL HIGH (ref 0.0–40.0)

## 2022-04-03 LAB — LDL CHOLESTEROL, DIRECT: Direct LDL: 127 mg/dL

## 2022-04-03 MED ORDER — LISINOPRIL 30 MG PO TABS: 30.0000 mg | ORAL_TABLET | Freq: Every day | ORAL | 1 refills | Status: DC

## 2022-04-03 MED ORDER — TETANUS-DIPHTH-ACELL PERTUSSIS 5-2-15.5 LF-MCG/0.5 IM SUSP: 0.5000 mL | Freq: Once | INTRAMUSCULAR | 0 refills | Status: AC

## 2022-04-03 MED ORDER — ZOLPIDEM TARTRATE ER 12.5 MG PO TBCR: EXTENDED_RELEASE_TABLET | ORAL | 5 refills | Status: DC

## 2022-04-03 MED ORDER — PRAVASTATIN SODIUM 80 MG PO TABS: 80.0000 mg | ORAL_TABLET | Freq: Every day | ORAL | 1 refills | Status: DC

## 2022-04-03 NOTE — Progress Notes (Signed)
OFFICE VISIT  04/03/2022  CC:  Chief Complaint  Patient presents with   Hypertension   Hyperlipidemia    Pt is fasting   Patient is a 71 y.o. male who presents for 62-monthfollow-up hypertension, hypercholesterolemia, GAD, and insomnia. A/P as of last visit: "#1 hypertension, well controlled.  Continue lisinopril 20 mg a day. Electrolytes and creatinine today.  #2 Hyperlipidemia, doing well on Pravachol 80 mg a day. Lipid panel and hepatic panel today.   #3 primary insomnia: Doing well long-term on Ambien CR 12.5 mg every night. UDS today.  4.  Chronic anxiety: Stable on long-term use of 0.5 mg alprazolam every 6 hours as needed. Urine drug screen today.   5. Preventative health care: Vaccines: Shingrix->declined.  Tdap->rx to pharmacy. Otherwise all utd. Labs: cbc,cmet, lipids, psa Prostate ca screening: PSA today Colon ca screening: recall 2025"  INTERIM HX: AAntony Hastefeels well. Continues to work full-time and enjoys it.  Blood pressures at home typically 1175-102systolic over 758Nto 85 diastolic.  Labs last visit showed significant cholesterol elevation.  He is already on max dose of pravastatin.  Anxiety well controlled on 0.5 mg Xanax 4 times daily long-term. Additionally, sleep is good and he has no side effects from Ambien 12.5 mg nightly.  PMP AWARE reviewed today: most recent rx for alprazolam was filled 03/21/2022, #120, rx by me. Most recent prescription for zolpidem filled 03/02/2022, #30, prescription by me. No red flags.  ROS as above, plus--> no fevers, no CP, no SOB, no wheezing, no cough, no dizziness, no HAs, no rashes, no melena/hematochezia.  No polyuria or polydipsia.  No myalgias or arthralgias.  No focal weakness, paresthesias, or tremors.  No acute vision or hearing abnormalities.  No dysuria or unusual/new urinary urgency or frequency.  No recent changes in lower legs. No n/v/d or abd pain.  No palpitations.    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  MRI L-spine 2007 showed some bulging discs with nerve impingement.  Has never had back injection or surgery.   Contact dermatitis and eczema    COVID-19 virus infection 07/2020   Diverticulitis    Genital herpes 08/2015   HSV type I on culture   Gilbert syndrome    Gout    "always right big toe"--about one episode every 4 yrs.  Right ankle flare, 02/2016.   History of adenomatous polyp of colon 2005   History of bronchitis 08/11/2012   History of Clostridium difficile colitis 2016   HTN (hypertension)    started lisinopril 08/20/19   Hyperlipidemia, mixed    Statin started 04/2018   PUD (peptic ulcer disease)     Past Surgical History:  Procedure Laterality Date   COLONOSCOPY  07/2013   Normal.  Recall 10 yrs.   COLONOSCOPY W/ POLYPECTOMY  2005   Dr. SLyla Son   Outpatient Medications Prior to Visit  Medication Sig Dispense Refill   ALPRAZolam (XANAX) 0.5 MG tablet TAKE 1 TABLET BY MOUTH FOUR TIMES DAILY AS NEEDED FOR STRESS 120 tablet 5   ibuprofen (ADVIL) 800 MG tablet TAKE 1 TABLET BY MOUTH THREE TIMES DAILY WITH FOOD AS NEEDED FOR PAIN 90 tablet 5   Multiple Vitamin (MULTIVITAMIN) tablet Take 1 tablet by mouth daily.  pantoprazole (PROTONIX) 40 MG tablet TAKE 1 TABLET(40 MG) BY MOUTH EVERY MORNING 90 tablet 3   saccharomyces boulardii (FLORASTOR) 250 MG capsule Take 1 capsules twice daily. 60 capsule 3   lisinopril (ZESTRIL) 20 MG tablet Take 1 tablet (20 mg total) by mouth daily. 90 tablet 1   Colchicine (MITIGARE) 0.6 MG CAPS 2 caps po at onset of gout flare, then take another 1 cap 1 hour later (Patient not taking: Reported on 04/03/2022) 30 capsule 2   fluticasone (CUTIVATE) 0.05 % cream Apply topically 2 (two) times daily. (Patient not taking: Reported on  04/03/2022) 30 g 3   ketoconazole (NIZORAL) 2 % shampoo Apply to scalp every other day as needed for scalp seborrheic dermatitis (Patient not taking: Reported on 04/03/2022) 120 mL 11   pravastatin (PRAVACHOL) 80 MG tablet Take 1 tablet (80 mg total) by mouth daily. 90 tablet 1   zolpidem (AMBIEN CR) 12.5 MG CR tablet TAKE 1 TABLET(12.5 MG) BY MOUTH AT BEDTIME AS NEEDED 30 tablet 5   No facility-administered medications prior to visit.    Allergies  Allergen Reactions   Prednisone Other (See Comments)    Insomnia, elevated BP and HR   Augmentin [Amoxicillin-Pot Clavulanate] Diarrhea    Upset stomach    ROS As per HPI  PE:    04/03/2022    8:03 AM 09/20/2021    8:10 AM 03/14/2021    8:02 AM  Vitals with BMI  Height '5\' 11"'$  '5\' 11"'$    Weight 173 lbs 6 oz 176 lbs 6 oz 171 lbs 13 oz  BMI 96.2 83.66   Systolic 294 765 465  Diastolic 82 76 82  Pulse 74 64 83    Physical Exam  Gen: Alert, well appearing.  Patient is oriented to person, place, time, and situation. AFFECT: pleasant, lucid thought and speech. CV: RRR, no m/r/g.   LUNGS: CTA bilat, nonlabored resps, good aeration in all lung fields. EXT: no clubbing or cyanosis.  no edema.   LABS:  Last CBC Lab Results  Component Value Date   WBC 8.0 09/20/2021   HGB 14.7 09/20/2021   HCT 42.8 09/20/2021   MCV 96.3 09/20/2021   MCH 33.1 06/17/2014   RDW 13.1 09/20/2021   PLT 191.0 03/54/6568   Last metabolic panel Lab Results  Component Value Date   GLUCOSE 85 09/20/2021   NA 138 09/20/2021   K 4.1 09/20/2021   CL 102 09/20/2021   CO2 27 09/20/2021   BUN 15 09/20/2021   CREATININE 0.95 09/20/2021   GFRNONAA >90 06/17/2014   CALCIUM 9.3 09/20/2021   PHOS 2.8 02/27/2012   PROT 6.5 09/20/2021   ALBUMIN 4.6 09/20/2021   BILITOT 1.0 09/20/2021   ALKPHOS 82 09/20/2021   AST 18 09/20/2021   ALT 18 09/20/2021   ANIONGAP 13 06/17/2014   Last lipids Lab Results  Component Value Date   CHOL 200 09/20/2021   HDL 51.40  09/20/2021   LDLDIRECT 122.0 09/20/2021   TRIG 242.0 (H) 09/20/2021   CHOLHDL 4 09/20/2021   Last thyroid functions Lab Results  Component Value Date   TSH 3.74 04/22/2018   Lab Results  Component Value Date   PSA 0.18 09/20/2021   PSA 0.27 09/13/2020   PSA 0.23 08/20/2019   IMPRESSION AND PLAN:  #1 hypertension, poor control. Increase lisinopril to 30 mg a day. Electrolytes and creatinine today. Monitor blood pressures daily and return in 2 weeks to review with me.  2.  Hypercholesterolemia.  Tolerating  pravastatin 80 mg a day. Lipid panel and hepatic panel today. Would like LDL down to around 100.  #3 anxiety.  Doing well on Xanax 0.5 mg 4 times daily and he has been on this dose long-term without side effect. A new prescription was not needed for this medication today. Controlled substance contract and UDS up-to-date.  4.  Insomnia, Ambien 12.5 mg nightly long-term.  Doing great. Med refilled today. Controlled substance contract and UDS up-to-date.  An After Visit Summary was printed and given to the patient.  FOLLOW UP: Return in about 2 weeks (around 04/17/2022) for f/u HTN.  Signed:  Crissie Sickles, MD           04/03/2022

## 2022-04-05 ENCOUNTER — Other Ambulatory Visit: Payer: Self-pay

## 2022-04-06 ENCOUNTER — Encounter: Payer: Self-pay | Admitting: Family Medicine

## 2022-04-06 DIAGNOSIS — E782 Mixed hyperlipidemia: Secondary | ICD-10-CM

## 2022-04-06 MED ORDER — ROSUVASTATIN CALCIUM 10 MG PO TABS: 10.0000 mg | ORAL_TABLET | Freq: Every day | ORAL | 2 refills | Status: DC

## 2022-04-06 NOTE — Telephone Encounter (Signed)
Okay. I sent in a prescription for rosuvastatin 10 mg. Fasting lab appointment in 2 to 3 months for lipid panel and AST and ALT.  Diagnosis mixed hyperlipidemia.

## 2022-04-07 NOTE — Telephone Encounter (Signed)
Labs ordered future

## 2022-04-07 NOTE — Telephone Encounter (Signed)
Please order proper labs for pt please.

## 2022-04-17 ENCOUNTER — Ambulatory Visit (INDEPENDENT_AMBULATORY_CARE_PROVIDER_SITE_OTHER): Payer: Medicare Other | Admitting: Family Medicine

## 2022-04-17 ENCOUNTER — Encounter: Payer: Self-pay | Admitting: Family Medicine

## 2022-04-17 VITALS — BP 129/82 | HR 75 | Temp 98.1°F | Ht 71.0 in | Wt 174.4 lb

## 2022-04-17 DIAGNOSIS — G4700 Insomnia, unspecified: Secondary | ICD-10-CM

## 2022-04-17 DIAGNOSIS — I10 Essential (primary) hypertension: Secondary | ICD-10-CM | POA: Diagnosis not present

## 2022-04-17 DIAGNOSIS — E7800 Pure hypercholesterolemia, unspecified: Secondary | ICD-10-CM | POA: Diagnosis not present

## 2022-04-17 DIAGNOSIS — F419 Anxiety disorder, unspecified: Secondary | ICD-10-CM

## 2022-04-17 MED ORDER — LISINOPRIL 30 MG PO TABS: 30.0000 mg | ORAL_TABLET | Freq: Every day | ORAL | 3 refills | Status: DC

## 2022-04-17 NOTE — Progress Notes (Signed)
OFFICE VISIT  04/17/2022  CC:  Chief Complaint  Patient presents with   Hypertension    Follow up; pt is not fasting    Patient is a 71 y.o. male who presents for 2-week follow-up uncontrolled hypertension. A/P as of last visit: "#1 hypertension, poor control. Increase lisinopril to 30 mg a day. Electrolytes and creatinine today. Monitor blood pressures daily and return in 2 weeks to review with me.   2.  Hypercholesterolemia.  Tolerating pravastatin 80 mg a day. Lipid panel and hepatic panel today. Would like LDL down to around 100.   #3 anxiety.  Doing well on Xanax 0.5 mg 4 times daily and he has been on this dose long-term without side effect. A new prescription was not needed for this medication today. Controlled substance contract and UDS up-to-date.   4.  Insomnia, Ambien 12.5 mg nightly long-term.  Doing great. Med refilled today. Controlled substance contract and UDS up-to-date."  INTERIM HX: Feels better. Just got new bp cuff, says it is reading very sporadic: 120s-160s, not correlating with ours here today at all.    LDL and trigs elev last visit, discussed getting on more potent statin: Just started rosuvastatin today.    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  MRI L-spine 2007 showed some bulging discs with nerve impingement.  Has never had back injection or surgery.   Contact dermatitis and eczema    COVID-19 virus infection 07/2020   Diverticulitis    Genital herpes 08/2015   HSV type I on culture   Gilbert syndrome    Gout    "always right big toe"--about one episode every 4 yrs.  Right ankle flare, 02/2016.   History of adenomatous polyp of colon 2005   History of bronchitis 08/11/2012   History of Clostridium difficile colitis 2016   HTN  (hypertension)    started lisinopril 08/20/19   Hyperlipidemia, mixed    Statin started 04/2018   PUD (peptic ulcer disease)     Past Surgical History:  Procedure Laterality Date   COLONOSCOPY  07/2013   Normal.  Recall 10 yrs.   COLONOSCOPY W/ POLYPECTOMY  2005   Dr. Lyla Son    Outpatient Medications Prior to Visit  Medication Sig Dispense Refill   ALPRAZolam (XANAX) 0.5 MG tablet TAKE 1 TABLET BY MOUTH FOUR TIMES DAILY AS NEEDED FOR STRESS 120 tablet 5   Colchicine (MITIGARE) 0.6 MG CAPS 2 caps po at onset of gout flare, then take another 1 cap 1 hour later (Patient not taking: Reported on 04/03/2022) 30 capsule 2   fluticasone (CUTIVATE) 0.05 % cream Apply topically 2 (two) times daily. (Patient not taking: Reported on 04/03/2022) 30 g 3   ibuprofen (ADVIL) 800 MG tablet TAKE 1 TABLET BY MOUTH THREE TIMES DAILY WITH FOOD AS NEEDED FOR PAIN 90 tablet 5   ketoconazole (NIZORAL) 2 % shampoo Apply to scalp every other day as needed for scalp seborrheic dermatitis (Patient not taking: Reported on 04/03/2022) 120 mL 11   Multiple Vitamin (MULTIVITAMIN) tablet Take 1 tablet by mouth daily.     pantoprazole (PROTONIX) 40 MG tablet TAKE 1 TABLET(40 MG) BY MOUTH EVERY MORNING 90 tablet 3   rosuvastatin (CRESTOR) 10 MG tablet Take 1 tablet (10 mg total) by  mouth daily. 30 tablet 2   saccharomyces boulardii (FLORASTOR) 250 MG capsule Take 1 capsules twice daily. 60 capsule 3   zolpidem (AMBIEN CR) 12.5 MG CR tablet TAKE 1 TABLET(12.5 MG) BY MOUTH AT BEDTIME AS NEEDED 30 tablet 5   lisinopril (ZESTRIL) 30 MG tablet Take 1 tablet (30 mg total) by mouth daily. 30 tablet 1   No facility-administered medications prior to visit.    Allergies  Allergen Reactions   Prednisone Other (See Comments)    Insomnia, elevated BP and HR   Augmentin [Amoxicillin-Pot Clavulanate] Diarrhea    Upset stomach    ROS As per HPI  PE:    04/17/2022    8:30 AM 04/03/2022    8:03 AM 09/20/2021    8:10 AM   Vitals with BMI  Height '5\' 11"'$  '5\' 11"'$  '5\' 11"'$   Weight 174 lbs 6 oz 173 lbs 6 oz 176 lbs 6 oz  BMI 24.33 33.2 95.18  Systolic 841 660 630  Diastolic 82 82 76  Pulse 75 74 64  Manual bp check today 130/78  Physical Exam  Gen: Alert, well appearing.  Patient is oriented to person, place, time, and situation. AFFECT: pleasant, lucid thought and speech. No further exam today.  LABS:  Last CBC Lab Results  Component Value Date   WBC 8.0 09/20/2021   HGB 14.7 09/20/2021   HCT 42.8 09/20/2021   MCV 96.3 09/20/2021   MCH 33.1 06/17/2014   RDW 13.1 09/20/2021   PLT 191.0 16/07/930   Last metabolic panel Lab Results  Component Value Date   GLUCOSE 104 (H) 04/03/2022   NA 141 04/03/2022   K 3.9 04/03/2022   CL 106 04/03/2022   CO2 24 04/03/2022   BUN 15 04/03/2022   CREATININE 0.95 04/03/2022   GFRNONAA >90 06/17/2014   CALCIUM 8.9 04/03/2022   PHOS 2.8 02/27/2012   PROT 6.4 04/03/2022   ALBUMIN 4.2 04/03/2022   BILITOT 1.4 (H) 04/03/2022   ALKPHOS 77 04/03/2022   AST 19 04/03/2022   ALT 17 04/03/2022   ANIONGAP 13 06/17/2014   Last lipids Lab Results  Component Value Date   CHOL 184 04/03/2022   HDL 51.60 04/03/2022   LDLDIRECT 127.0 04/03/2022   TRIG 259.0 (H) 04/03/2022   CHOLHDL 4 04/03/2022   IMPRESSION AND PLAN:  1) HTN, well controlled on lisinopril '30mg'$  qd.  2) HLD: just switched from prava to rosuva today for increased potency. Plan recheck lipids and hepatic panel in 58mo  An After Visit Summary was printed and given to the patient.  FOLLOW UP: Return in about 6 months (around 10/17/2022) for routine chronic illness f/u.  Signed:  PCrissie Sickles MD           04/17/2022

## 2022-05-08 ENCOUNTER — Telehealth: Payer: Self-pay

## 2022-05-08 NOTE — Telephone Encounter (Signed)
PA completed over the phone   Medication: Zolpidem ER 12.5 tab   Dx: G47.00 (chronic insomnia)   Per Dr. Anitra Lauth pt has tried and failed n/a, has not tried altyernatives belsoma or trazodone   Waiting for response.   Ref # [id number]

## 2022-05-19 ENCOUNTER — Other Ambulatory Visit: Payer: Self-pay | Admitting: Family Medicine

## 2022-06-22 ENCOUNTER — Other Ambulatory Visit: Payer: Self-pay | Admitting: Family Medicine

## 2022-06-22 NOTE — Telephone Encounter (Signed)
Requesting: alprazolam Contract: 03/29/22 UDS: 09/20/21 Last Visit: 04/17/22 Next Visit: 07/24/22 Last Refill: 12/19/21 (120,5)  Please Advise;Marland Kitchen Med pending

## 2022-07-01 DIAGNOSIS — J019 Acute sinusitis, unspecified: Secondary | ICD-10-CM | POA: Diagnosis not present

## 2022-07-06 ENCOUNTER — Other Ambulatory Visit: Payer: Self-pay | Admitting: Family Medicine

## 2022-07-24 ENCOUNTER — Ambulatory Visit: Payer: Medicare Other | Admitting: Family Medicine

## 2022-07-29 ENCOUNTER — Encounter: Payer: Self-pay | Admitting: Family Medicine

## 2022-07-31 ENCOUNTER — Other Ambulatory Visit: Payer: Self-pay

## 2022-07-31 MED ORDER — PANTOPRAZOLE SODIUM 40 MG PO TBEC: DELAYED_RELEASE_TABLET | ORAL | 3 refills | Status: DC

## 2022-08-07 ENCOUNTER — Encounter: Payer: Self-pay | Admitting: Family Medicine

## 2022-08-07 ENCOUNTER — Ambulatory Visit (INDEPENDENT_AMBULATORY_CARE_PROVIDER_SITE_OTHER): Payer: Medicare Other | Admitting: Family Medicine

## 2022-08-07 DIAGNOSIS — E782 Mixed hyperlipidemia: Secondary | ICD-10-CM | POA: Diagnosis not present

## 2022-08-07 LAB — AST: AST: 15 U/L (ref 0–37)

## 2022-08-07 LAB — LIPID PANEL
Cholesterol: 175 mg/dL (ref 0–200)
HDL: 57.8 mg/dL (ref >39.00)
NonHDL: 116.86
Total CHOL/HDL Ratio: 3
Triglycerides: 264 mg/dL — ABNORMAL HIGH (ref 0.0–149.0)
VLDL: 52.8 mg/dL — ABNORMAL HIGH (ref 0.0–40.0)

## 2022-08-07 LAB — LDL CHOLESTEROL, DIRECT: Direct LDL: 94 mg/dL

## 2022-08-07 LAB — ALT: ALT: 15 U/L (ref 0–53)

## 2022-08-07 NOTE — Progress Notes (Signed)
OFFICE VISIT  08/07/2022  CC:  Chief Complaint  Patient presents with   Medical Management of Chronic Issues    Pt is fasting   Patient is a 72 y.o. male who presents for follow-up hyperlipidemia.  INTERIM HX: We switched him from pravastatin to almost 4 months ago and attempt to get better LDL control.  He feels well, no side effects.   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  MRI L-spine 2007 showed some bulging discs with nerve impingement.  Has never had back injection or surgery.   Contact dermatitis and eczema    COVID-19 virus infection 07/2020   Diverticulitis    Genital herpes 08/2015   HSV type I on culture   Gilbert syndrome    Gout    "always right big toe"--about one episode every 4 yrs.  Right ankle flare, 02/2016.   History of adenomatous polyp of colon 2005   History of bronchitis 08/11/2012   History of Clostridium difficile colitis 2016   HTN (hypertension)    started lisinopril 08/20/19   Hyperlipidemia, mixed    Statin started 04/2018   PUD (peptic ulcer disease)     Past Surgical History:  Procedure Laterality Date   COLONOSCOPY  07/2013   Normal.  Recall 10 yrs.   COLONOSCOPY W/ POLYPECTOMY  2005   Dr. Lyla Son    Outpatient Medications Prior to Visit  Medication Sig Dispense Refill   ALPRAZolam (XANAX) 0.5 MG tablet TAKE 1 TABLET BY MOUTH FOUR TIMES DAILY AS NEEDED FOR STRESS 120 tablet 5   fluticasone (CUTIVATE) 0.05 % cream Apply topically 2 (two) times daily. 30 g 3   ibuprofen (ADVIL) 800 MG tablet TAKE 1 TABLET BY MOUTH THREE TIMES DAILY WITH FOOD AS NEEDED FOR PAIN 90 tablet 5   ketoconazole (NIZORAL) 2 % shampoo Apply to scalp every other day as needed for scalp seborrheic dermatitis 120 mL 11   lisinopril (ZESTRIL) 30 MG tablet  Take 1 tablet (30 mg total) by mouth daily. 90 tablet 3   Multiple Vitamin (MULTIVITAMIN) tablet Take 1 tablet by mouth daily.     pantoprazole (PROTONIX) 40 MG tablet TAKE 1 TABLET(40 MG) BY MOUTH EVERY MORNING 90 tablet 3   rosuvastatin (CRESTOR) 10 MG tablet TAKE 1 TABLET(10 MG) BY MOUTH DAILY 30 tablet 0   saccharomyces boulardii (FLORASTOR) 250 MG capsule Take 1 capsules twice daily. 60 capsule 3   zolpidem (AMBIEN CR) 12.5 MG CR tablet TAKE 1 TABLET(12.5 MG) BY MOUTH AT BEDTIME AS NEEDED 30 tablet 5   Colchicine (MITIGARE) 0.6 MG CAPS 2 caps po at onset of gout flare, then take another 1 cap 1 hour later (Patient not taking: Reported on 04/03/2022) 30 capsule 2   No facility-administered medications prior to visit.    Allergies  Allergen Reactions   Prednisone Other (See Comments)    Insomnia, elevated BP and HR   Augmentin [Amoxicillin-Pot Clavulanate] Diarrhea    Upset stomach    Review of Systems As per HPI  PE:    08/07/2022    8:23 AM 04/17/2022    8:30 AM 04/03/2022    8:03 AM  Vitals with BMI  Height '5\' 11"'$  '5\' 11"'$  '5\' 11"'$   Weight 175 lbs 6 oz 174 lbs 6  oz 173 lbs 6 oz  BMI 24.47 78.58 85.0  Systolic 277 412 878  Diastolic 73 82 82  Pulse 78 75 74     Physical Exam  Gen: Alert, well appearing.  Patient is oriented to person, place, time, and situation. AFFECT: pleasant, lucid thought and speech. No further exam today  LABS:  Last metabolic panel Lab Results  Component Value Date   GLUCOSE 104 (H) 04/03/2022   NA 141 04/03/2022   K 3.9 04/03/2022   CL 106 04/03/2022   CO2 24 04/03/2022   BUN 15 04/03/2022   CREATININE 0.95 04/03/2022   GFRNONAA >90 06/17/2014   CALCIUM 8.9 04/03/2022   PHOS 2.8 02/27/2012   PROT 6.4 04/03/2022   ALBUMIN 4.2 04/03/2022   BILITOT 1.4 (H) 04/03/2022   ALKPHOS 77 04/03/2022   AST 19 04/03/2022   ALT 17 04/03/2022   ANIONGAP 13 06/17/2014   Last lipids Lab Results  Component Value Date   CHOL 184 04/03/2022   HDL  51.60 04/03/2022   LDLDIRECT 127.0 04/03/2022   TRIG 259.0 (H) 04/03/2022   CHOLHDL 4 04/03/2022   IMPRESSION AND PLAN:  Hypercholesterolemia.   Goal LDL <100. Did not reach this goal with max dose pravastatin so switched to rosuva 10 qd 3 mo ago. Tolerating this well.  Recheck lipids and AST/ALT today and will push dose if not at goal.  An After Visit Summary was printed and given to the patient.  FOLLOW UP: Return in about 3 months (around 11/06/2022) for routine chronic illness f/u.  Signed:  Crissie Sickles, MD           08/07/2022

## 2022-08-08 ENCOUNTER — Telehealth: Payer: Self-pay

## 2022-08-08 MED ORDER — ROSUVASTATIN CALCIUM 10 MG PO TABS: ORAL_TABLET | ORAL | 3 refills | Status: DC

## 2022-08-08 NOTE — Telephone Encounter (Signed)
-----  Message from Tammi Sou, MD sent at 08/08/2022  7:57 AM EST ----- Please do prescription for rosuvastatin 10 mg, 1 daily, #90, refill x 3.

## 2022-08-25 ENCOUNTER — Encounter: Payer: Self-pay | Admitting: Family Medicine

## 2022-08-25 ENCOUNTER — Ambulatory Visit (INDEPENDENT_AMBULATORY_CARE_PROVIDER_SITE_OTHER): Payer: Medicare Other | Admitting: Family Medicine

## 2022-08-25 VITALS — BP 111/74 | HR 85 | Temp 98.2°F | Ht 71.0 in | Wt 173.4 lb

## 2022-08-25 DIAGNOSIS — K130 Diseases of lips: Secondary | ICD-10-CM

## 2022-08-25 NOTE — Progress Notes (Signed)
OFFICE VISIT  08/25/2022  CC:  Chief Complaint  Patient presents with   Mouth Lesions    X 2 months, used abreva tube otc for 3 weeks, last used 2 days ago    Patient is a 72 y.o. male who presents for cold sore.  HPI: Patient has at least a 3-week history of a nonhealing crusty bump on the lower lip. It has not tingled or been painful.  He has used Abreva daily for the last few weeks and it has not changed.  It has not bled.  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  MRI L-spine 2007 showed some bulging discs with nerve impingement.  Has never had back injection or surgery.   Contact dermatitis and eczema    COVID-19 virus infection 07/2020   Diverticulitis    Genital herpes 08/2015   HSV type I on culture   Gilbert syndrome    Gout    "always right big toe"--about one episode every 4 yrs.  Right ankle flare, 02/2016.   History of adenomatous polyp of colon 2005   History of bronchitis 08/11/2012   History of Clostridium difficile colitis 2016   HTN (hypertension)    started lisinopril 08/20/19   Hyperlipidemia, mixed    Statin started 04/2018   PUD (peptic ulcer disease)     Past Surgical History:  Procedure Laterality Date   COLONOSCOPY  07/2013   Normal.  Recall 10 yrs.   COLONOSCOPY W/ POLYPECTOMY  2005   Dr. Lyla Son    Outpatient Medications Prior to Visit  Medication Sig Dispense Refill   ALPRAZolam (XANAX) 0.5 MG tablet TAKE 1 TABLET BY MOUTH FOUR TIMES DAILY AS NEEDED FOR STRESS 120 tablet 5   fluticasone (CUTIVATE) 0.05 % cream Apply topically 2 (two) times daily. 30 g 3   ibuprofen (ADVIL) 800 MG tablet TAKE 1 TABLET BY MOUTH THREE TIMES DAILY WITH FOOD AS NEEDED FOR PAIN 90 tablet 5   ketoconazole (NIZORAL) 2 % shampoo Apply to scalp every other  day as needed for scalp seborrheic dermatitis 120 mL 11   lisinopril (ZESTRIL) 30 MG tablet Take 1 tablet (30 mg total) by mouth daily. 90 tablet 3   Multiple Vitamin (MULTIVITAMIN) tablet Take 1 tablet by mouth daily.     pantoprazole (PROTONIX) 40 MG tablet TAKE 1 TABLET(40 MG) BY MOUTH EVERY MORNING 90 tablet 3   rosuvastatin (CRESTOR) 10 MG tablet TAKE 1 TABLET(10 MG) BY MOUTH DAILY 90 tablet 3   saccharomyces boulardii (FLORASTOR) 250 MG capsule Take 1 capsules twice daily. 60 capsule 3   zolpidem (AMBIEN CR) 12.5 MG CR tablet TAKE 1 TABLET(12.5 MG) BY MOUTH AT BEDTIME AS NEEDED 30 tablet 5   Colchicine (MITIGARE) 0.6 MG CAPS 2 caps po at onset of gout flare, then take another 1 cap 1 hour later (Patient not taking: Reported on 04/03/2022) 30 capsule 2   No facility-administered medications prior to visit.    Allergies  Allergen Reactions   Prednisone Other (See Comments)    Insomnia, elevated BP and HR   Augmentin [Amoxicillin-Pot Clavulanate] Diarrhea    Upset stomach    Review of Systems  As per HPI  PE:    08/25/2022    9:49 AM 08/07/2022  8:23 AM 04/17/2022    8:30 AM  Vitals with BMI  Height 5' 11"$  5' 11"$  5' 11"$   Weight 173 lbs 6 oz 175 lbs 6 oz 174 lbs 6 oz  BMI 24.2 123456 0000000  Systolic [id number]  Diastolic 74 73 82  Pulse 85 78 75     Physical Exam  Gen: Alert, well appearing.  Patient is oriented to person, place, time, and situation. AFFECT: pleasant, lucid thought and speech. Lower lip: Approximately 7 mm round nodule with crusted periphery and slightly pinkish smooth central region.  No erythema or tenderness.  Oral cavity, buccal mucosa, dentition, and gingiva appear normal. Neck is without any lymphadenopathy.  LABS:  Last CBC Lab Results  Component Value Date   WBC 8.0 09/20/2021   HGB 14.7 09/20/2021   HCT 42.8 09/20/2021   MCV 96.3 09/20/2021   MCH 33.1 06/17/2014   RDW 13.1 09/20/2021   PLT 191.0 [id number]   Last metabolic panel Lab  Results  Component Value Date   GLUCOSE 104 (H) 04/03/2022   NA 141 04/03/2022   K 3.9 04/03/2022   CL 106 04/03/2022   CO2 24 04/03/2022   BUN 15 04/03/2022   CREATININE 0.95 04/03/2022   GFRNONAA >90 06/17/2014   CALCIUM 8.9 04/03/2022   PHOS 2.8 02/27/2012   PROT 6.4 04/03/2022   ALBUMIN 4.2 04/03/2022   BILITOT 1.4 (H) 04/03/2022   ALKPHOS 77 04/03/2022   AST 15 08/07/2022   ALT 15 08/07/2022   ANIONGAP 13 06/17/2014   IMPRESSION AND PLAN:  Nonhealing lip lesion. I do not think this is herpes labialis. Need to rule out lip cancer.  Other possibility is pyogenic granuloma. Will refer to ENT.  If they do not evaluate patients for this problem then we will try oral surgery. And is set to go on a trip to [place] in 3 weeks.  If specialist cannot get him in prior to that then I told him I would do a shave biopsy of the lesion here.  He will think about this. In the meantime I recommended he use sunscreen lip balm.  An After Visit Summary was printed and given to the patient.  FOLLOW UP: Return for f/u to be determined based on referral/specialist evaluation.  Signed:  Crissie Sickles, MD           08/25/2022

## 2022-08-25 NOTE — Addendum Note (Signed)
Addended by: Tammi Sou on: 08/25/2022 10:15 AM   Modules accepted: Orders

## 2022-08-28 ENCOUNTER — Ambulatory Visit (INDEPENDENT_AMBULATORY_CARE_PROVIDER_SITE_OTHER): Payer: Medicare Other | Admitting: Plastic Surgery

## 2022-08-28 ENCOUNTER — Encounter: Payer: Self-pay | Admitting: Plastic Surgery

## 2022-08-28 VITALS — BP 120/78 | HR 83 | Ht 70.0 in | Wt 174.0 lb

## 2022-08-28 DIAGNOSIS — L989 Disorder of the skin and subcutaneous tissue, unspecified: Secondary | ICD-10-CM

## 2022-08-28 NOTE — Progress Notes (Signed)
Referring Provider Tammi Sou, MD [address] [place],  [place] [address]   CC:  Chief Complaint  Patient presents with   Consult      Keith Sanders is an 72 y.o. male.  HPI: Keith Sanders is a very pleasant 72 year old male who was referred to the office for evaluation of a lesion on the lower lip on the right side.  He states that this started out as a small white dot but has grown slowly over the past few weeks.  Initially he thought that it was a cold sore and has treated it for 3 weeks with a topical agent but has not had any decrease in size.  He was referred for excision of the lesion.  Allergies  Allergen Reactions   Prednisone Other (See Comments)    Insomnia, elevated BP and HR   Augmentin [Amoxicillin-Pot Clavulanate] Diarrhea    Upset stomach    Outpatient Encounter Medications as of 08/28/2022  Medication Sig   ALPRAZolam (XANAX) 0.5 MG tablet TAKE 1 TABLET BY MOUTH FOUR TIMES DAILY AS NEEDED FOR STRESS   Colchicine (MITIGARE) 0.6 MG CAPS 2 caps po at onset of gout flare, then take another 1 cap 1 hour later   fluticasone (CUTIVATE) 0.05 % cream Apply topically 2 (two) times daily.   ibuprofen (ADVIL) 800 MG tablet TAKE 1 TABLET BY MOUTH THREE TIMES DAILY WITH FOOD AS NEEDED FOR PAIN   ketoconazole (NIZORAL) 2 % shampoo Apply to scalp every other day as needed for scalp seborrheic dermatitis   lisinopril (ZESTRIL) 30 MG tablet Take 1 tablet (30 mg total) by mouth daily.   Multiple Vitamin (MULTIVITAMIN) tablet Take 1 tablet by mouth daily.   pantoprazole (PROTONIX) 40 MG tablet TAKE 1 TABLET(40 MG) BY MOUTH EVERY MORNING   rosuvastatin (CRESTOR) 10 MG tablet TAKE 1 TABLET(10 MG) BY MOUTH DAILY   saccharomyces boulardii (FLORASTOR) 250 MG capsule Take 1 capsules twice daily.   zolpidem (AMBIEN CR) 12.5 MG CR tablet TAKE 1 TABLET(12.5 MG) BY MOUTH AT BEDTIME AS NEEDED   No facility-administered encounter medications on file as of 08/28/2022.     Past Medical  History:  Diagnosis Date   Anxiety    BPPV (benign paroxysmal positional vertigo) 06/2014   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  MRI L-spine 2007 showed some bulging discs with nerve impingement.  Has never had back injection or surgery.   Contact dermatitis and eczema    COVID-19 virus infection 07/2020   Diverticulitis    Genital herpes 08/2015   HSV type I on culture   Gilbert syndrome    Gout    "always right big toe"--about one episode every 4 yrs.  Right ankle flare, 02/2016.   History of adenomatous polyp of colon 2005   History of bronchitis 08/11/2012   History of Clostridium difficile colitis 2016   HTN (hypertension)    started lisinopril 08/20/19   Hyperlipidemia, mixed    Statin started 04/2018   PUD (peptic ulcer disease)     Past Surgical History:  Procedure Laterality Date   COLONOSCOPY  07/2013   Normal.  Recall 10 yrs.   COLONOSCOPY W/ POLYPECTOMY  2005   Dr. Lyla Son    Family History  Problem Relation Age of Onset   Heart attack Father 58   Heart disease Father  MI age 48   Heart disease Other    Colon polyps Mother    Ovarian cancer Sister    Colon cancer Neg Hx     Social History   Social History Narrative   Married, 3 grown children.   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   Lived in [place] area all his life.   No T/A/Ds.   Walks 3-4 times per day.     Review of Systems General: Denies fevers, chills, weight loss CV: Denies chest pain, shortness of breath, palpitations Skin: Lesion on the right side of the lower lip for several weeks.  No additional lesions of concern.  Physical Exam    08/28/2022   10:09 AM 08/25/2022    9:49 AM 08/07/2022    8:23 AM  Vitals with BMI  Height 5' 10"$  5' 11"$  5' 11"$   Weight 174 lbs 173 lbs 6 oz 175 lbs 6 oz  BMI 24.97 [id number]  Systolic 123456 [id number]  Diastolic 78 74 73  Pulse 83 85 78     General:  No acute distress,  Alert and oriented, Non-Toxic, Normal speech and affect Integument: On the right side of the lower lip there is a 1 cm ulcerated lesion.  There is no surrounding erythema and on palpation of the lip the lesion appears to be confined to the skin. Lymph nodes: No palpable lymphadenopathy in the submental, submandibular, or supraclavicular nodes. Oral cavity: No visible or palpable lesions in the oral cavity or associated with the tongue.   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  I discussed this with Keith Sanders at length.  For certain areas on the face such as lips nose and eyelids the more appropriate therapy is Mohs surgery which I do not do.  I will refer him to Dr. Sheryle Spray, for evaluation and treatment.  I will be available for closure if he requires a more extensive closure of the lip.  Camillia Herter 08/28/2022, 10:32 AM

## 2022-08-29 ENCOUNTER — Encounter: Payer: Self-pay | Admitting: Family Medicine

## 2022-09-05 NOTE — Telephone Encounter (Signed)
Hi Alesha, it looks like Dr. Lovena Le had put in an ambulatory referral to dermatology. Can you follow up on this? Thank you

## 2022-09-05 NOTE — Telephone Encounter (Signed)
Perfect, thank you

## 2022-09-05 NOTE — Telephone Encounter (Signed)
OK thank for doing that. He just sent [place] a message saying that he still has not heard from them which is why I wanted to pass it along. Thanks.

## 2022-09-07 DIAGNOSIS — C4402 Squamous cell carcinoma of skin of lip: Secondary | ICD-10-CM | POA: Diagnosis not present

## 2022-09-07 DIAGNOSIS — D3701 Neoplasm of uncertain behavior of lip: Secondary | ICD-10-CM | POA: Diagnosis not present

## 2022-09-15 ENCOUNTER — Encounter: Payer: Self-pay | Admitting: Family Medicine

## 2022-09-15 ENCOUNTER — Telehealth: Payer: Self-pay

## 2022-09-15 MED ORDER — IBUPROFEN 800 MG PO TABS: ORAL_TABLET | ORAL | 5 refills | Status: DC

## 2022-09-15 NOTE — Telephone Encounter (Signed)
Pt advised refill sent to new pharmacy.

## 2022-09-15 NOTE — Telephone Encounter (Signed)
Patient left voicemail 09/14/22 2:00PM.   Message retrieved.  Patient requesting refill on medication.  [facility]  [facility] [place]   ibuprofen (ADVIL) 800 MG tablet

## 2022-09-28 DIAGNOSIS — C001 Malignant neoplasm of external lower lip: Secondary | ICD-10-CM | POA: Diagnosis not present

## 2022-10-02 ENCOUNTER — Other Ambulatory Visit: Payer: Self-pay | Admitting: Family Medicine

## 2022-10-02 NOTE — Telephone Encounter (Signed)
Requesting: zolpidem Contract: 03/29/22 UDS: 04/03/22 Last Visit: 08/25/22 Next Visit: 11/07/22 Last Refill: 04/03/22 (30,5)  Please Advise. Med pending

## 2022-10-30 ENCOUNTER — Encounter: Payer: Self-pay | Admitting: *Deleted

## 2022-10-31 DIAGNOSIS — L578 Other skin changes due to chronic exposure to nonionizing radiation: Secondary | ICD-10-CM | POA: Diagnosis not present

## 2022-10-31 DIAGNOSIS — L570 Actinic keratosis: Secondary | ICD-10-CM | POA: Diagnosis not present

## 2022-10-31 DIAGNOSIS — L905 Scar conditions and fibrosis of skin: Secondary | ICD-10-CM | POA: Diagnosis not present

## 2022-11-07 ENCOUNTER — Encounter: Payer: Self-pay | Admitting: Family Medicine

## 2022-11-07 ENCOUNTER — Ambulatory Visit (INDEPENDENT_AMBULATORY_CARE_PROVIDER_SITE_OTHER): Payer: Medicare Other | Admitting: Family Medicine

## 2022-11-07 VITALS — BP 140/84 | HR 71 | Temp 98.1°F | Ht 70.0 in | Wt 172.6 lb

## 2022-11-07 DIAGNOSIS — Z79899 Other long term (current) drug therapy: Secondary | ICD-10-CM

## 2022-11-07 DIAGNOSIS — I10 Essential (primary) hypertension: Secondary | ICD-10-CM

## 2022-11-07 DIAGNOSIS — E7800 Pure hypercholesterolemia, unspecified: Secondary | ICD-10-CM

## 2022-11-07 DIAGNOSIS — F5104 Psychophysiologic insomnia: Secondary | ICD-10-CM | POA: Diagnosis not present

## 2022-11-07 DIAGNOSIS — Z125 Encounter for screening for malignant neoplasm of prostate: Secondary | ICD-10-CM | POA: Diagnosis not present

## 2022-11-07 DIAGNOSIS — F411 Generalized anxiety disorder: Secondary | ICD-10-CM | POA: Diagnosis not present

## 2022-11-07 DIAGNOSIS — Z0000 Encounter for general adult medical examination without abnormal findings: Secondary | ICD-10-CM

## 2022-11-07 LAB — LIPID PANEL
Cholesterol: 173 mg/dL (ref 0–200)
HDL: 56.8 mg/dL (ref >39.00)
LDL Cholesterol: 81 mg/dL (ref 0–99)
NonHDL: 115.7
Total CHOL/HDL Ratio: 3
Triglycerides: 173 mg/dL — ABNORMAL HIGH (ref 0.0–149.0)
VLDL: 34.6 mg/dL (ref 0.0–40.0)

## 2022-11-07 LAB — COMPREHENSIVE METABOLIC PANEL
ALT: 20 U/L (ref 0–53)
AST: 23 U/L (ref 0–37)
Albumin: 4.5 g/dL (ref 3.5–5.2)
Alkaline Phosphatase: 82 U/L (ref 39–117)
BUN: 14 mg/dL (ref 6–23)
CO2: 24 mEq/L (ref 19–32)
Calcium: 9.2 mg/dL (ref 8.4–10.5)
Chloride: 105 mEq/L (ref 96–112)
Creatinine, Ser: 0.9 mg/dL (ref 0.40–1.50)
GFR: 87.05 mL/min (ref >60.00)
Glucose, Bld: 98 mg/dL (ref 70–99)
Potassium: 4.1 mEq/L (ref 3.5–5.1)
Sodium: 139 mEq/L (ref 135–145)
Total Bilirubin: 1.2 mg/dL (ref 0.2–1.2)
Total Protein: 6.5 g/dL (ref 6.0–8.3)

## 2022-11-07 LAB — CBC
HCT: 41.4 % (ref 39.0–52.0)
Hemoglobin: 14.7 g/dL (ref 13.0–17.0)
MCHC: 35.6 g/dL (ref 30.0–36.0)
MCV: 95.4 fl (ref 78.0–100.0)
Platelets: 225 10*3/uL (ref 150.0–400.0)
RBC: 4.34 Mil/uL (ref 4.22–5.81)
RDW: 13 % (ref 11.5–15.5)
WBC: 9.5 10*3/uL (ref 4.0–10.5)

## 2022-11-07 LAB — PSA, MEDICARE: PSA: 0.25 ng/ml (ref 0.10–4.00)

## 2022-11-07 MED ORDER — SACCHAROMYCES BOULARDII 250 MG PO CAPS: ORAL_CAPSULE | ORAL | 11 refills | Status: DC

## 2022-11-07 NOTE — Patient Instructions (Signed)
Health Maintenance, Male Adopting a healthy lifestyle and getting preventive care are important in promoting health and wellness. Ask your health care provider about: The right schedule for you to have regular tests and exams. Things you can do on your own to prevent diseases and keep yourself healthy. What should I know about diet, weight, and exercise? Eat a healthy diet  Eat a diet that includes plenty of vegetables, fruits, low-fat dairy products, and lean protein. Do not eat a lot of foods that are high in solid fats, added sugars, or sodium. Maintain a healthy weight Body mass index (BMI) is a measurement that can be used to identify possible weight problems. It estimates body fat based on height and weight. Your health care provider can help determine your BMI and help you achieve or maintain a healthy weight. Get regular exercise Get regular exercise. This is one of the most important things you can do for your health. Most adults should: Exercise for at least 150 minutes each week. The exercise should increase your heart rate and make you sweat (moderate-intensity exercise). Do strengthening exercises at least twice a week. This is in addition to the moderate-intensity exercise. Spend less time sitting. Even light physical activity can be beneficial. Watch cholesterol and blood lipids Have your blood tested for lipids and cholesterol at 70 years of age, then have this test every 5 years. You may need to have your cholesterol levels checked more often if: Your lipid or cholesterol levels are high. You are older than 70 years of age. You are at high risk for heart disease. What should I know about cancer screening? Many types of cancers can be detected early and may often be prevented. Depending on your health history and family history, you may need to have cancer screening at various ages. This may include screening for: Colorectal cancer. Prostate cancer. Skin cancer. Lung  cancer. What should I know about heart disease, diabetes, and high blood pressure? Blood pressure and heart disease High blood pressure causes heart disease and increases the risk of stroke. This is more likely to develop in people who have high blood pressure readings or are overweight. Talk with your health care provider about your target blood pressure readings. Have your blood pressure checked: Every 3-5 years if you are 18-39 years of age. Every year if you are 40 years old or older. If you are between the ages of 65 and 75 and are a current or former smoker, ask your health care provider if you should have a one-time screening for abdominal aortic aneurysm (AAA). Diabetes Have regular diabetes screenings. This checks your fasting blood sugar level. Have the screening done: Once every three years after age 45 if you are at a normal weight and have a low risk for diabetes. More often and at a younger age if you are overweight or have a high risk for diabetes. What should I know about preventing infection? Hepatitis B If you have a higher risk for hepatitis B, you should be screened for this virus. Talk with your health care provider to find out if you are at risk for hepatitis B infection. Hepatitis C Blood testing is recommended for: Everyone born from 1945 through 1965. Anyone with known risk factors for hepatitis C. Sexually transmitted infections (STIs) You should be screened each year for STIs, including gonorrhea and chlamydia, if: You are sexually active and are younger than 70 years of age. You are older than 70 years of age and your   health care provider tells you that you are at risk for this type of infection. Your sexual activity has changed since you were last screened, and you are at increased risk for chlamydia or gonorrhea. Ask your health care provider if you are at risk. Ask your health care provider about whether you are at high risk for HIV. Your health care provider  may recommend a prescription medicine to help prevent HIV infection. If you choose to take medicine to prevent HIV, you should first get tested for HIV. You should then be tested every 3 months for as long as you are taking the medicine. Follow these instructions at home: Alcohol use Do not drink alcohol if your health care provider tells you not to drink. If you drink alcohol: Limit how much you have to 0-2 drinks a day. Know how much alcohol is in your drink. In the [place] one drink equals one 12 oz bottle of beer (355 mL), one 5 oz glass of wine (148 mL), or one 1 oz glass of hard liquor (44 mL). Lifestyle Do not use any products that contain nicotine or tobacco. These products include cigarettes, chewing tobacco, and vaping devices, such as e-cigarettes. If you need help quitting, ask your health care provider. Do not use street drugs. Do not share needles. Ask your health care provider for help if you need support or information about quitting drugs. General instructions Schedule regular health, dental, and eye exams. Stay current with your vaccines. Tell your health care provider if: You often feel depressed. You have ever been abused or do not feel safe at home. Summary Adopting a healthy lifestyle and getting preventive care are important in promoting health and wellness. Follow your health care provider's instructions about healthy diet, exercising, and getting tested or screened for diseases. Follow your health care provider's instructions on monitoring your cholesterol and blood pressure. This information is not intended to replace advice given to you by your health care provider. Make sure you discuss any questions you have with your health care provider. Document Revised: 11/22/2020 Document Reviewed: 11/22/2020 Elsevier Patient Education  2023 Elsevier Inc.  

## 2022-11-07 NOTE — Progress Notes (Signed)
OFFICE VISIT  11/07/2022  CC:  Chief Complaint  Patient presents with   Medical Management of Chronic Issues    Pt is fasting    Patient is a 72 y.o. male who presents for annual health maintenance exam and follow-up insomnia, hypertension, hyperlipidemia, anxiety.  INTERIM HX: Keith Sanders feels well.  Continues to work full-time, Engineer, site for [employer]. 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  He is pleased with results.  Home blood pressures consistently 130/80. He walks a few miles daily for exercise. Diet is fairly healthy.  Anxiety well-controlled with alprazolam 0.5 mg 4 times daily long-term. He sleeps well as long as he takes Ambien ER every night.  PMP AWARE reviewed today: most recent rx for Ambien ER was filled 11/02/2022, # 30, rx by me. Most recent alprazolam prescription filled 10/24/2022, #120, prescription by me. No red flags.   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  MRI L-spine 2007 showed some bulging discs with nerve impingement.  Has never had back injection or surgery.   Contact dermatitis and eczema    COVID-19 virus infection 07/2020   Diverticulitis    Genital herpes 08/2015   HSV type I on culture   Gilbert syndrome    Gout    "always right big toe"--about one episode every 4 yrs.  Right ankle flare, 02/2016.   History of adenomatous polyp of colon 2005   History of bronchitis 08/11/2012   History of Clostridium difficile colitis 2016   HTN (hypertension)    started lisinopril 08/20/19   Hyperlipidemia, mixed    Statin started 04/2018   Insomnia    PUD (peptic ulcer disease)    SCC (squamous cell carcinoma), lip    mohs surgery 2024    Past Surgical History:  Procedure Laterality Date    COLONOSCOPY  07/2013   Normal.  Recall 10 yrs.   COLONOSCOPY W/ POLYPECTOMY  2005   Dr. Victorino Dike   Family History  Problem Relation Age of Onset   Heart attack Father 103   Heart disease Father        MI age 3   Heart disease Other    Colon polyps Mother    Ovarian cancer Sister    Colon cancer Neg Hx     Social History   Socioeconomic History   Marital status: Married    Spouse name: Not on file   Number of children: 3   Years of education: Not on file   Highest education level: Some college, no degree  Occupational History   Occupation: Airline pilot  Tobacco Use   Smoking status: Never   Smokeless tobacco: Never  Vaping Use   Vaping Use: Never used  Substance and Sexual Activity   Alcohol use: Yes    Alcohol/week: 14.0 standard drinks of alcohol    Types: 14 Cans of beer per week   Drug use: No   Sexual activity: Yes    Partners: Female  Other Topics Concern   Not on file  Social History Narrative   Married, 3 grown children.   Works in Counselling psychologist Financial trader).   Lived in [place] area all his life.   No T/A/Ds.   Walks 3-4 times per  day.   Social Determinants of Health   Financial Resource Strain: Low Risk  (03/31/2022)   Overall Financial Resource Strain (CARDIA)    Difficulty of Paying Living Expenses: Not hard at all  Food Insecurity: No Food Insecurity (03/31/2022)   Hunger Vital Sign    Worried About Running Out of Food in the Last Year: Never true    Ran Out of Food in the Last Year: Never true  Transportation Needs: No Transportation Needs (03/31/2022)   PRAPARE - Administrator, Civil Service (Medical): No    Lack of Transportation (Non-Medical): No  Physical Activity: Insufficiently Active (03/31/2022)   Exercise Vital Sign    Days of Exercise per Week: 6 days    Minutes of Exercise per Session: 20 min  Stress: Stress Concern Present (03/31/2022)   [employer] of Occupational Health - Occupational Stress Questionnaire    Feeling of  Stress : To some extent  Social Connections: Socially Integrated (03/31/2022)   Social Connection and Isolation Panel [NHANES]    Frequency of Communication with Friends and Family: More than three times a week    Frequency of Social Gatherings with Friends and Family: More than three times a week    Attends Religious Services: 1 to 4 times per year    Active Member of [employer] or Organizations: Yes    Attends Banker Meetings: 1 to 4 times per year    Marital Status: Married     Outpatient Medications Prior to Visit  Medication Sig Dispense Refill   ALPRAZolam (XANAX) 0.5 MG tablet TAKE 1 TABLET BY MOUTH FOUR TIMES DAILY AS NEEDED FOR STRESS 120 tablet 5   Colchicine (MITIGARE) 0.6 MG CAPS 2 caps po at onset of gout flare, then take another 1 cap 1 hour later 30 capsule 2   fluticasone (CUTIVATE) 0.05 % cream Apply topically 2 (two) times daily. 30 g 3   ibuprofen (ADVIL) 800 MG tablet TAKE 1 TABLET BY MOUTH THREE TIMES DAILY WITH FOOD AS NEEDED FOR PAIN 90 tablet 5   ketoconazole (NIZORAL) 2 % shampoo Apply to scalp every other day as needed for scalp seborrheic dermatitis 120 mL 11   lisinopril (ZESTRIL) 30 MG tablet Take 1 tablet (30 mg total) by mouth daily. 90 tablet 3   Multiple Vitamin (MULTIVITAMIN) tablet Take 1 tablet by mouth daily.     pantoprazole (PROTONIX) 40 MG tablet TAKE 1 TABLET(40 MG) BY MOUTH EVERY MORNING 90 tablet 3   rosuvastatin (CRESTOR) 10 MG tablet TAKE 1 TABLET(10 MG) BY MOUTH DAILY 90 tablet 3   zolpidem (AMBIEN CR) 12.5 MG CR tablet TAKE 1 TAB BY MOUTH AT BEDTIME AS NEEDED 30 tablet 5   saccharomyces boulardii (FLORASTOR) 250 MG capsule Take 1 capsules twice daily. 60 capsule 3   No facility-administered medications prior to visit.    Allergies  Allergen Reactions   Prednisone Other (See Comments)    Insomnia, elevated BP and HR   Augmentin [Amoxicillin-Pot Clavulanate] Diarrhea    Upset stomach    Review of Systems  Constitutional:   Negative for appetite change, chills, fatigue and fever.  HENT:  Negative for congestion, dental problem, ear pain and sore throat.   Eyes:  Negative for discharge, redness and visual disturbance.  Respiratory:  Negative for cough, chest tightness, shortness of breath and wheezing.   Cardiovascular:  Negative for chest pain, palpitations and leg swelling.  Gastrointestinal:  Negative for abdominal pain, blood in stool, diarrhea, nausea and  vomiting.  Genitourinary:  Negative for difficulty urinating, dysuria, flank pain, frequency, hematuria and urgency.  Musculoskeletal:  Negative for arthralgias, back pain, joint swelling, myalgias and neck stiffness.  Skin:  Negative for pallor and rash.  Neurological:  Negative for dizziness, speech difficulty, weakness and headaches.  Hematological:  Negative for adenopathy. Does not bruise/bleed easily.  Psychiatric/Behavioral:  Negative for confusion and sleep disturbance. The patient is not nervous/anxious.      PE:    11/07/2022    8:45 AM 11/07/2022    8:29 AM 08/28/2022   10:09 AM  Vitals with BMI  Height  5\' 10"  5\' 10"   Weight  172 lbs 10 oz 174 lbs  BMI  24.77 24.97  Systolic 140 147 161  Diastolic 84 88 78  Pulse  71 83     Physical Exam  Gen: Alert, well appearing.  Patient is oriented to person, place, time, and situation. AFFECT: pleasant, lucid thought and speech. ENT: Ears: EACs clear, normal epithelium.  TMs with good light reflex and landmarks bilaterally.  Eyes: no injection, icteris, swelling, or exudate.  EOMI, PERRLA. Nose: no drainage or turbinate edema/swelling.  No injection or focal lesion.  Mouth: lips without lesion/swelling.  Oral mucosa pink and moist.  Dentition intact and without obvious caries or gingival swelling.  Oropharynx without erythema, exudate, or swelling.  Neck: supple/nontender.  No LAD, mass, or TM.  Carotid pulses 2+ bilaterally, without bruits. CV: RRR, no m/r/g.   LUNGS: CTA bilat, nonlabored  resps, good aeration in all lung fields. ABD: soft, NT, ND, BS normal.  No hepatospenomegaly or mass.  No bruits. EXT: no clubbing, cyanosis, or edema.  Musculoskeletal: no joint swelling, erythema, warmth, or tenderness.  ROM of all joints intact. Skin - no sores or suspicious lesions or rashes or color changes   LABS:  Last CBC Lab Results  Component Value Date   WBC 8.0 09/20/2021   HGB 14.7 09/20/2021   HCT 42.8 09/20/2021   MCV 96.3 09/20/2021   MCH 33.1 06/17/2014   RDW 13.1 09/20/2021   PLT 191.0 09/20/2021   Last metabolic panel Lab Results  Component Value Date   GLUCOSE 104 (H) 04/03/2022   NA 141 04/03/2022   K 3.9 04/03/2022   CL 106 04/03/2022   CO2 24 04/03/2022   BUN 15 04/03/2022   CREATININE 0.95 04/03/2022   GFRNONAA >90 06/17/2014   CALCIUM 8.9 04/03/2022   PHOS 2.8 02/27/2012   PROT 6.4 04/03/2022   ALBUMIN 4.2 04/03/2022   BILITOT 1.4 (H) 04/03/2022   ALKPHOS 77 04/03/2022   AST 15 08/07/2022   ALT 15 08/07/2022   ANIONGAP 13 06/17/2014   Last lipids Lab Results  Component Value Date   CHOL 175 08/07/2022   HDL 57.80 08/07/2022   LDLDIRECT 94.0 08/07/2022   TRIG 264.0 (H) 08/07/2022   CHOLHDL 3 08/07/2022   Last thyroid functions Lab Results  Component Value Date   TSH 3.74 04/22/2018   Lab Results  Component Value Date   PSA 0.18 09/20/2021   PSA 0.27 09/13/2020   PSA 0.23 08/20/2019    IMPRESSION AND PLAN:  #1 health maintenance exam: Reviewed age and gender appropriate health maintenance issues (prudent diet, regular exercise, health risks of tobacco and excessive alcohol, use of seatbelts, fire alarms in home, use of sunscreen).  Also reviewed age and gender appropriate health screening as well as vaccine recommendations. Vaccines: Shingrix-->declined. otherwise up-to-date. Labs: CBC, c-Met, lipids, PSA Prostate ca screening: PSA today  Colon ca screening: recall 2025  #2 hypertension, well-controlled on lisinopril 30 mg a  day. Electrolytes and creatinine today.  3.  Hypercholesterolemia, doing well on rosuvastatin 10 mg a day. Lipid panel and hepatic panel today.  #4 chronic anxiety, doing well on Xanax 0.5 mg 4 times daily long-term. Controlled substance contract and urine drug screen up-to-date.  #5  Insomnia.  Doing good with long-term use of Ambien CR 12.5 mg nightly.  An After Visit Summary was printed and given to the patient.  FOLLOW UP: Return in about 6 months (around 05/09/2023) for routine chronic illness f/u.  Signed:  Santiago Bumpers, MD           11/07/2022

## 2022-12-04 ENCOUNTER — Other Ambulatory Visit: Payer: Self-pay

## 2022-12-04 MED ORDER — ZOLPIDEM TARTRATE 10 MG PO TABS: 10.0000 mg | ORAL_TABLET | Freq: Every evening | ORAL | 5 refills | Status: DC | PRN

## 2022-12-04 NOTE — Telephone Encounter (Signed)
Requesting: zolpidem  Contract: 03/29/22 UDS: 09/20/21 Last Provider Visit: 11/07/22 Next Provider Visit: 05/09/23 Last Refill: 10/02/22 (30,5)  Pharmacy is requesting change to 10mg  due to national backorder.

## 2022-12-05 NOTE — Telephone Encounter (Signed)
Pharmacy does not currently have 12.5mg  in stock and would like to know if a prescription can be sent for the 10mg  instead

## 2022-12-22 ENCOUNTER — Other Ambulatory Visit: Payer: Self-pay | Admitting: Family Medicine

## 2022-12-22 NOTE — Telephone Encounter (Signed)
[facility]. Next OV is 10/23.

## 2023-01-27 DIAGNOSIS — J019 Acute sinusitis, unspecified: Secondary | ICD-10-CM | POA: Diagnosis not present

## 2023-02-09 DIAGNOSIS — Z872 Personal history of diseases of the skin and subcutaneous tissue: Secondary | ICD-10-CM | POA: Diagnosis not present

## 2023-02-09 DIAGNOSIS — Z09 Encounter for follow-up examination after completed treatment for conditions other than malignant neoplasm: Secondary | ICD-10-CM | POA: Diagnosis not present

## 2023-02-09 DIAGNOSIS — L578 Other skin changes due to chronic exposure to nonionizing radiation: Secondary | ICD-10-CM | POA: Diagnosis not present

## 2023-02-14 ENCOUNTER — Encounter (INDEPENDENT_AMBULATORY_CARE_PROVIDER_SITE_OTHER): Payer: Self-pay

## 2023-03-05 ENCOUNTER — Other Ambulatory Visit: Payer: Self-pay | Admitting: Family Medicine

## 2023-03-07 ENCOUNTER — Other Ambulatory Visit: Payer: Self-pay | Admitting: Family Medicine

## 2023-03-07 ENCOUNTER — Ambulatory Visit (INDEPENDENT_AMBULATORY_CARE_PROVIDER_SITE_OTHER): Payer: Medicare Other

## 2023-03-07 VITALS — Wt 172.0 lb

## 2023-03-07 DIAGNOSIS — Z0000 Encounter for general adult medical examination without abnormal findings: Secondary | ICD-10-CM | POA: Diagnosis not present

## 2023-03-07 NOTE — Patient Instructions (Signed)
Keith Sanders , Thank you for taking time to come for your Medicare Wellness Visit. I appreciate your ongoing commitment to your health goals. Please review the following plan we discussed and let me know if I can assist you in the future.   Referrals/Orders/Follow-Ups/Clinician Recommendations: continue to stay healthy and active   This is a list of the screening recommended for you and due dates:  Health Maintenance  Topic Date Due   Zoster (Shingles) Vaccine (1 of 2) Never done   COVID-19 Vaccine (3 - Pfizer risk series) 01/16/2020   Medicare Annual Wellness Visit  03/02/2023   Flu Shot  02/15/2023   Colon Cancer Screening  08/13/2023   Pneumonia Vaccine  Completed   HPV Vaccine  Aged Out   DTaP/Tdap/Td vaccine  Discontinued   Hepatitis C Screening  Discontinued    Advanced directives: (Copy Requested) Please bring a copy of your health care power of attorney and living will to the office to be added to your chart at your convenience.  Next Medicare Annual Wellness Visit scheduled for next year: Yes

## 2023-03-07 NOTE — Progress Notes (Signed)
Subjective:   Keith Sanders is a 72 y.o. male who presents for Medicare Annual/Subsequent preventive examination.  Visit Complete: Virtual  I connected with  Toniann Ket on 03/07/23 by a audio enabled telemedicine application and verified that I am speaking with the correct person using two identifiers.   Patient Location: Home  Provider Location: Home Office  I discussed the limitations of evaluation and management by telemedicine. The patient expressed understanding and agreed to proceed.  Patient Medicare AWV questionnaire was completed by the patient on 03/05/23; I have confirmed that all information answered by patient is correct and no changes since this date.  Vital Signs: Unable to obtain new vitals due to this being a telehealth visit.   Review of Systems     Cardiac Risk Factors include: hypertension;male gender     Objective:    Today's Vitals   03/07/23 0833  Weight: 172 lb (78 kg)   Body mass index is 24.68 kg/m.     03/07/2023    8:36 AM 03/01/2022    8:41 AM 02/23/2021    8:37 AM 03/06/2015    7:46 AM 06/17/2014   10:13 AM  Advanced Directives  Does Patient Have a Medical Advance Directive? Yes Yes Yes No No  Type of Estate agent of [place];Living will Healthcare Power of [place];Living will Living will    Copy of Healthcare Power of Attorney in Chart? No - copy requested No - copy requested     Would patient like information on creating a medical advance directive?    No - patient declined information     Current Medications (verified) Outpatient Encounter Medications as of 03/07/2023  Medication Sig   ALPRAZolam (XANAX) 0.5 MG tablet TAKE 1 TABLET BY MOUTH FOUR TIMES A DAY AS NEEDED   Colchicine (MITIGARE) 0.6 MG CAPS 2 caps po at onset of gout flare, then take another 1 cap 1 hour later   fluticasone (CUTIVATE) 0.05 % cream Apply topically 2 (two) times daily.   ibuprofen (ADVIL) 800 MG tablet TAKE 1 TABLET BY MOUTH THREE  TIMES DAILY WITH FOOD AS NEEDED FOR PAIN   ketoconazole (NIZORAL) 2 % shampoo Apply to scalp every other day as needed for scalp seborrheic dermatitis   lisinopril (ZESTRIL) 30 MG tablet Take 1 tablet (30 mg total) by mouth daily.   Multiple Vitamin (MULTIVITAMIN) tablet Take 1 tablet by mouth daily.   pantoprazole (PROTONIX) 40 MG tablet TAKE 1 TABLET(40 MG) BY MOUTH EVERY MORNING   rosuvastatin (CRESTOR) 10 MG tablet TAKE 1 TABLET(10 MG) BY MOUTH DAILY   saccharomyces boulardii (FLORASTOR) 250 MG capsule Take 1 capsules twice daily.   zolpidem (AMBIEN CR) 12.5 MG CR tablet TAKE 1 TAB BY MOUTH AT BEDTIME AS NEEDED   [DISCONTINUED] zolpidem (AMBIEN) 10 MG tablet Take 1 tablet (10 mg total) by mouth at bedtime as needed for sleep.   No facility-administered encounter medications on file as of 03/07/2023.    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  MRI L-spine 2007 showed some bulging discs with nerve impingement.  Has never had back injection or surgery.   Contact dermatitis and eczema    COVID-19 virus infection 07/2020  Diverticulitis    Genital herpes 08/2015   HSV type I on culture   Gilbert syndrome    Gout    "always right big toe"--about one episode every 4 yrs.  Right ankle flare, 02/2016.   History of adenomatous polyp of colon 2005   History of bronchitis 08/11/2012   History of Clostridium difficile colitis 2016   HTN (hypertension)    started lisinopril 08/20/19   Hyperlipidemia, mixed    Statin started 04/2018   Insomnia    PUD (peptic ulcer disease)    SCC (squamous cell carcinoma), lip    mohs surgery 2024   Past Surgical History:  Procedure Laterality Date    COLONOSCOPY  07/2013   Normal.  Recall 10 yrs.   COLONOSCOPY W/ POLYPECTOMY  2005   Dr. Victorino Dike   Family History  Problem Relation Age of Onset   Heart attack Father 56   Heart disease Father        MI age 67   Heart disease Other    Colon polyps Mother    Ovarian cancer Sister    Colon cancer Neg Hx    Social History   Socioeconomic History   Marital status: Married    Spouse name: Not on file   Number of children: 3   Years of education: Not on file   Highest education level: Some college, no degree  Occupational History   Occupation: Airline pilot  Tobacco Use   Smoking status: Never   Smokeless tobacco: Never  Vaping Use   Vaping status: Never Used  Substance and Sexual Activity   Alcohol use: Yes    Alcohol/week: 14.0 standard drinks of alcohol    Types: 14 Cans of beer per week   Drug use: No   Sexual activity: Yes    Partners: Female  Other Topics Concern   Not on file  Social History Narrative   Married, 3 grown children.   Works in Counselling psychologist Financial trader).   Lived in [place] area all his life.   No T/A/Ds.   Walks 3-4 times per day.   Social Determinants of Health   Financial Resource Strain: Low Risk  (03/05/2023)   Overall Financial Resource Strain (CARDIA)    Difficulty of Paying Living Expenses: Not hard at all  Food Insecurity: No Food Insecurity (03/05/2023)   Hunger Vital Sign    Worried About Running Out of Food in the Last Year: Never true    Ran Out of Food in the Last Year: Never true  Transportation Needs: No Transportation Needs (03/05/2023)   PRAPARE - Administrator, Civil Service (Medical): No    Lack of Transportation (Non-Medical): No  Physical Activity: Sufficiently Active (03/05/2023)   Exercise Vital Sign    Days of Exercise per Week: 7 days    Minutes of Exercise per Session: 150+ min  Stress: Stress Concern Present (03/05/2023)   [employer] of Occupational Health - Occupational Stress Questionnaire    Feeling of  Stress : To some extent  Social Connections: Socially Integrated (03/05/2023)   Social Connection and Isolation Panel [NHANES]    Frequency of Communication with Friends and Family: More than three times a week    Frequency of Social Gatherings with Friends and Family: Twice a week    Attends Religious Services: 1 to 4 times per year    Active Member of [employer] or Organizations: Yes    Attends Banker Meetings: More than 4 times per year  Marital Status: Married    Tobacco Counseling Counseling given: Not Answered   Clinical Intake:  Pre-visit preparation completed: Yes  Pain : No/denies pain     BMI - recorded: 24.68 Nutritional Risks: None Diabetes: No  How often do you need to have someone help you when you read instructions, pamphlets, or other written materials from your doctor or pharmacy?: 1 - Never  Interpreter Needed?: No  Information entered by :: Lanier Ensign, LPN   Activities of Daily Living    03/05/2023    1:43 PM  In your present state of health, do you have any difficulty performing the following activities:  Hearing? 0  Vision? 0  Difficulty concentrating or making decisions? 0  Walking or climbing stairs? 0  Dressing or bathing? 0  Doing errands, shopping? 0  Preparing Food and eating ? N  Using the Toilet? N  In the past six months, have you accidently leaked urine? N  Do you have problems with loss of bowel control? N  Managing your Medications? N  Managing your Finances? N  Housekeeping or managing your Housekeeping? N    Patient Care Team: Jeoffrey Massed, MD as PCP - General (Family Medicine) Coletta Memos, MD as Consulting Physician (Neurosurgery) Myra Rude, MD (Inactive) as Consulting Physician (Sports Medicine) Santiago Glad, MD as Consulting Physician (Plastic Surgery)  Indicate any recent Medical Services you may have received from other than [facility] providers in the past year (date may be  approximate).     Assessment:   This is a routine wellness examination for [employer].  Hearing/Vision screen Hearing Screening - Comments:: Pt denies any hearing issues  Vision Screening - Comments:: Pt encouraged to follow back up with [facility] for annual eye exams   Dietary issues and exercise activities discussed:     Goals Addressed             This Visit's Progress    Patient Stated       Continue to stay active and healthy        Depression Screen    03/07/2023    8:37 AM 11/07/2022    8:30 AM 08/07/2022    8:24 AM 03/01/2022    8:40 AM 02/23/2021    8:36 AM 09/13/2020    8:30 AM 03/11/2020    8:06 AM  PHQ 2/9 Scores  PHQ - 2 Score 0 0 0 0 0 0 0    Fall Risk    03/05/2023    1:43 PM 11/07/2022    8:30 AM 08/07/2022    8:24 AM 03/31/2022   10:00 AM 03/01/2022    8:41 AM  Fall Risk   Falls in the past year? 0 0 0 0 0  Number falls in past yr: 0 0 0  0  Injury with Fall? 0 0 0  0  Risk for fall due to : Impaired vision No Fall Risks No Fall Risks    Follow up Falls prevention discussed Falls evaluation completed Falls evaluation completed  Falls prevention discussed    MEDICARE RISK AT HOME: Medicare Risk at Home Any stairs in or around the home?: Yes If so, are there any without handrails?: No Home free of loose throw rugs in walkways, pet beds, electrical cords, etc?: Yes Adequate lighting in your home to reduce risk of falls?: Yes Life alert?: No Use of a cane, walker or w/c?: No Grab bars in the bathroom?: No Shower chair or bench in shower?: No Elevated  toilet seat or a handicapped toilet?: No  TIMED UP AND GO:  Was the test performed?  No    Cognitive Function:        03/07/2023    8:39 AM 03/01/2022    8:43 AM  6CIT Screen  What Year? 0 points 0 points  What month? 0 points 0 points  What time? 0 points 0 points  Count back from 20 0 points 0 points  Months in reverse 0 points 0 points  Repeat phrase 0 points 0 points  Total Score 0  points 0 points    Immunizations Immunization History  Administered Date(s) Administered   PFIZER(Purple Top)SARS-COV-2 Vaccination 11/28/2019, 12/19/2019   Pneumococcal Conjugate-13 09/13/2020   Pneumococcal Polysaccharide-23 04/22/2018, 08/20/2019   Tdap 07/20/2011      Flu Vaccine status: Declined, Education has been provided regarding the importance of this vaccine but patient still declined. Advised may receive this vaccine at local pharmacy or Health Dept. Aware to provide a copy of the vaccination record if obtained from local pharmacy or Health Dept. Verbalized acceptance and understanding.  Pneumococcal vaccine status: Up to date  Covid-19 vaccine status: Information provided on how to obtain vaccines.   Qualifies for Shingles Vaccine? Yes   Zostavax completed No   Shingrix Completed?: No.    Education has been provided regarding the importance of this vaccine. Patient has been advised to call insurance company to determine out of pocket expense if they have not yet received this vaccine. Advised may also receive vaccine at local pharmacy or Health Dept. Verbalized acceptance and understanding.  Screening Tests Health Maintenance  Topic Date Due   Zoster Vaccines- Shingrix (1 of 2) Never done   COVID-19 Vaccine (3 - Pfizer risk series) 01/16/2020   INFLUENZA VACCINE  02/15/2023   Colonoscopy  08/13/2023   Medicare Annual Wellness (AWV)  03/06/2024   Pneumonia Vaccine 37+ Years old  Completed   HPV VACCINES  Aged Out   DTaP/Tdap/Td  Discontinued   Hepatitis C Screening  Discontinued    Health Maintenance  Health Maintenance Due  Topic Date Due   Zoster Vaccines- Shingrix (1 of 2) Never done   COVID-19 Vaccine (3 - Pfizer risk series) 01/16/2020   INFLUENZA VACCINE  02/15/2023    Colorectal cancer screening: Type of screening: Colonoscopy. Completed 08/12/13. Repeat every 10 years   Additional Screening:  Hepatitis C Screening: does not qualify; pt preference    Vision Screening: Recommended annual ophthalmology exams for early detection of glaucoma and other disorders of the eye. Is the patient up to date with their annual eye exam?  No  Who is the provider or what is the name of the office in which the patient attends annual eye exams? [facility]  If pt is not established with a provider, would they like to be referred to a provider to establish care? No .   Dental Screening: Recommended annual dental exams for proper oral hygiene    Community Resource Referral / Chronic Care Management: CRR required this visit?  No   CCM required this visit?  No     Plan:     I have personally reviewed and noted the following in the patient's chart:   Medical and social history Use of alcohol, tobacco or illicit drugs  Current medications and supplements including opioid prescriptions. Patient is not currently taking opioid prescriptions. Functional ability and status Nutritional status Physical activity Advanced directives List of other physicians Hospitalizations, surgeries, and ER visits in previous  12 months Vitals Screenings to include cognitive, depression, and falls Referrals and appointments  In addition, I have reviewed and discussed with patient certain preventive protocols, quality metrics, and best practice recommendations. A written personalized care plan for preventive services as well as general preventive health recommendations were provided to patient.     Marzella Schlein, LPN   1/61/0960   After Visit Summary: (MyChart) Due to this being a telephonic visit, the after visit summary with patients personalized plan was offered to patient via MyChart   Nurse Notes: none

## 2023-03-18 ENCOUNTER — Other Ambulatory Visit: Payer: Self-pay | Admitting: Family Medicine

## 2023-04-14 ENCOUNTER — Other Ambulatory Visit: Payer: Self-pay | Admitting: Family Medicine

## 2023-04-16 NOTE — Telephone Encounter (Signed)
Okay, prescription sent. This is the first request I have seen.

## 2023-04-16 NOTE — Telephone Encounter (Signed)
Patient was told by pharmacy, they have faxed request multiple times with no response.  Patient has been without meds, and has not been sleeping well.  Can we try to get this called in today please?

## 2023-04-20 ENCOUNTER — Other Ambulatory Visit: Payer: Self-pay | Admitting: Family Medicine

## 2023-05-09 ENCOUNTER — Encounter: Payer: Self-pay | Admitting: Family Medicine

## 2023-05-09 ENCOUNTER — Ambulatory Visit (INDEPENDENT_AMBULATORY_CARE_PROVIDER_SITE_OTHER): Payer: Medicare Other | Admitting: Family Medicine

## 2023-05-09 VITALS — BP 129/83 | HR 69 | Wt 169.0 lb

## 2023-05-09 DIAGNOSIS — F5101 Primary insomnia: Secondary | ICD-10-CM | POA: Diagnosis not present

## 2023-05-09 DIAGNOSIS — Z79899 Other long term (current) drug therapy: Secondary | ICD-10-CM | POA: Diagnosis not present

## 2023-05-09 DIAGNOSIS — I10 Essential (primary) hypertension: Secondary | ICD-10-CM

## 2023-05-09 DIAGNOSIS — E782 Mixed hyperlipidemia: Secondary | ICD-10-CM

## 2023-05-09 DIAGNOSIS — F411 Generalized anxiety disorder: Secondary | ICD-10-CM | POA: Diagnosis not present

## 2023-05-09 DIAGNOSIS — Z125 Encounter for screening for malignant neoplasm of prostate: Secondary | ICD-10-CM

## 2023-05-09 LAB — COMPREHENSIVE METABOLIC PANEL
ALT: 23 U/L (ref 0–53)
AST: 24 U/L (ref 0–37)
Albumin: 4.4 g/dL (ref 3.5–5.2)
Alkaline Phosphatase: 72 U/L (ref 39–117)
BUN: 14 mg/dL (ref 6–23)
CO2: 26 meq/L (ref 19–32)
Calcium: 9.1 mg/dL (ref 8.4–10.5)
Chloride: 104 meq/L (ref 96–112)
Creatinine, Ser: 0.93 mg/dL (ref 0.40–1.50)
GFR: 83.4 mL/min (ref >60.00)
Glucose, Bld: 105 mg/dL — ABNORMAL HIGH (ref 70–99)
Potassium: 3.9 meq/L (ref 3.5–5.1)
Sodium: 138 meq/L (ref 135–145)
Total Bilirubin: 1.3 mg/dL — ABNORMAL HIGH (ref 0.2–1.2)
Total Protein: 6.5 g/dL (ref 6.0–8.3)

## 2023-05-09 LAB — LIPID PANEL
Cholesterol: 173 mg/dL (ref 0–200)
HDL: 54.6 mg/dL (ref >39.00)
LDL Cholesterol: 60 mg/dL (ref 0–99)
NonHDL: 118.1
Total CHOL/HDL Ratio: 3
Triglycerides: 289 mg/dL — ABNORMAL HIGH (ref 0.0–149.0)
VLDL: 57.8 mg/dL — ABNORMAL HIGH (ref 0.0–40.0)

## 2023-05-09 MED ORDER — FLUTICASONE PROPIONATE 0.05 % EX CREA: TOPICAL_CREAM | Freq: Two times a day (BID) | CUTANEOUS | 2 refills | Status: AC

## 2023-05-09 MED ORDER — ALPRAZOLAM 0.5 MG PO TABS: ORAL_TABLET | ORAL | 5 refills | Status: DC

## 2023-05-09 MED ORDER — COLCHICINE 0.6 MG PO CAPS: ORAL_CAPSULE | ORAL | 2 refills | Status: DC

## 2023-05-09 MED ORDER — KETOCONAZOLE 2 % EX SHAM: MEDICATED_SHAMPOO | CUTANEOUS | 11 refills | Status: AC

## 2023-05-09 NOTE — Progress Notes (Signed)
OFFICE VISIT  05/09/2023  CC:  Chief Complaint  Patient presents with   Medical Management of Chronic Issues    Pt is fasting.     Patient is a 72 y.o. male who presents for 59-month follow-up insomnia, hypertension, hyperlipidemia, and anxiety. A/P as of last visit: "1 hypertension, well-controlled on lisinopril 30 mg a day. Electrolytes and creatinine today.   2  Hypercholesterolemia, doing well on rosuvastatin 10 mg a day. Lipid panel and hepatic panel today.   3 chronic anxiety, doing well on Xanax 0.5 mg 4 times daily long-term. Controlled substance contract and urine drug screen up-to-date.   4  Insomnia.  Doing good with long-term use of Ambien CR 12.5 mg nightly.  INTERIM HX: Hessie Diener feels well. He keeps active during his day, lots of steps.  He feels like he eats pretty healthy diet. 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 He is enjoying his young grandkids.  He does take alprazolam 0.5 mg 4 times daily as well as Ambien CR 12.5 mg nightly. His anxiety is well-controlled and his insomnia is well-controlled on these medications.  Home blood pressures consistently in the 120s over 70s.  He had a gout flare in his toe about 2 months ago.  He does not have any colchicine left right now.   PMP AWARE reviewed today: most recent rx for alprazolam 0.5 mg was filled 04/28/2023, # 120, rx by me.  Most recent prescription for zolpidem ER 12.5 mg was filled 04/16/2023, #90, prescription by me. No red flags.  ROS as above, plus--> no fevers, no CP, no SOB, no wheezing, no cough, no dizziness, no HAs, scalp a bit flaky and itchy lately, no melena/hematochezia.  No polyuria or polydipsia.  No myalgias or arthralgias.  No focal weakness, paresthesias, or tremors.  No acute vision or hearing abnormalities.  No dysuria or unusual/new urinary urgency or frequency.  No recent changes in lower legs. No n/v/d or abd pain.  No  palpitations.    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  MRI L-spine 2007 showed some bulging discs with nerve impingement.  Has never had back injection or surgery.   Contact dermatitis and eczema    COVID-19 virus infection 07/2020   Diverticulitis    Genital herpes 08/2015   HSV type I on culture   Gilbert syndrome    Gout    "always right big toe"--about one episode every 4 yrs.  Right ankle flare, 02/2016.   History of adenomatous polyp of colon 2005   History of bronchitis 08/11/2012   History of Clostridium difficile colitis 2016   HTN (hypertension)    started lisinopril 08/20/19   Hyperlipidemia, mixed    Statin started 04/2018   Insomnia    PUD (peptic ulcer disease)    SCC (squamous cell carcinoma), lip    mohs surgery 2024    Past Surgical History:  Procedure Laterality Date   COLONOSCOPY  07/2013   Normal.  Recall 10 yrs.   COLONOSCOPY W/ POLYPECTOMY  2005   Dr. Victorino Dike    Outpatient Medications Prior to Visit  Medication Sig Dispense Refill   fluticasone (CUTIVATE) 0.05 % cream Apply topically 2 (two) times daily. 30  g 3   ibuprofen (ADVIL) 800 MG tablet TAKE 1 TABLET BY MOUTH THREE TIMES DAILY WITH FOOD AS NEEDED FOR PAIN 90 tablet 0   lisinopril (ZESTRIL) 30 MG tablet Take 1 tablet (30 mg total) by mouth daily. 90 tablet 3   Multiple Vitamin (MULTIVITAMIN) tablet Take 1 tablet by mouth daily.     pantoprazole (PROTONIX) 40 MG tablet TAKE 1 TABLET(40 MG) BY MOUTH EVERY MORNING 90 tablet 3   rosuvastatin (CRESTOR) 10 MG tablet TAKE 1 TABLET(10 MG) BY MOUTH DAILY 90 tablet 3   zolpidem (AMBIEN CR) 12.5 MG CR tablet TAKE 1 TABLET BY MOUTH EVERY DAY AT BEDTIME AS NEEDED 90 tablet 1   ALPRAZolam (XANAX) 0.5 MG tablet TAKE 1 TABLET BY MOUTH FOUR TIMES A  DAY AS NEEDED 120 tablet 5   Colchicine (MITIGARE) 0.6 MG CAPS 2 caps po at onset of gout flare, then take another 1 cap 1 hour later 30 capsule 2   ketoconazole (NIZORAL) 2 % shampoo Apply to scalp every other day as needed for scalp seborrheic dermatitis 120 mL 11   saccharomyces boulardii (FLORASTOR) 250 MG capsule Take 1 capsules twice daily. 60 capsule 11   No facility-administered medications prior to visit.    Allergies  Allergen Reactions   Prednisone Other (See Comments)    Insomnia, elevated BP and HR   Augmentin [Amoxicillin-Pot Clavulanate] Diarrhea    Upset stomach    Review of Systems As per HPI  PE:    05/09/2023    8:15 AM 03/07/2023    8:33 AM 11/07/2022    8:45 AM  Vitals with BMI  Weight 169 lbs 172 lbs   Systolic 129  140  Diastolic 83  84  Pulse 69       Physical Exam  Gen: Alert, well appearing.  Patient is oriented to person, place, time, and situation. AFFECT: pleasant, lucid thought and speech. CV: RRR, no m/r/g.   LUNGS: CTA bilat, nonlabored resps, good aeration in all lung fields. EXT: no clubbing or cyanosis.  no edema.    LABS:  Last CBC Lab Results  Component Value Date   WBC 9.5 11/07/2022   HGB 14.7 11/07/2022   HCT 41.4 11/07/2022   MCV 95.4 11/07/2022   MCH 33.1 06/17/2014   RDW 13.0 11/07/2022   PLT 225.0 11/07/2022   Last metabolic panel Lab Results  Component Value Date   GLUCOSE 98 11/07/2022   NA 139 11/07/2022   K 4.1 11/07/2022   CL 105 11/07/2022   CO2 24 11/07/2022   BUN 14 11/07/2022   CREATININE 0.90 11/07/2022   GFR 87.05 11/07/2022   CALCIUM 9.2 11/07/2022   PHOS 2.8 02/27/2012   PROT 6.5 11/07/2022   ALBUMIN 4.5 11/07/2022   BILITOT 1.2 11/07/2022   ALKPHOS 82 11/07/2022   AST 23 11/07/2022   ALT 20 11/07/2022   ANIONGAP 13 06/17/2014   Last lipids Lab Results  Component Value Date   CHOL 173 11/07/2022   HDL 56.80 11/07/2022   LDLCALC 81 11/07/2022   LDLDIRECT 94.0 08/07/2022   TRIG 173.0  (H) 11/07/2022   CHOLHDL 3 11/07/2022   Last thyroid functions Lab Results  Component Value Date   TSH 3.74 04/22/2018   Lab Results  Component Value Date   PSA 0.25 11/07/2022   PSA 0.18 09/20/2021   PSA 0.27 09/13/2020   IMPRESSION AND PLAN:  1 hypertension, well-controlled on lisinopril 30 mg a day. Electrolytes and creatinine today.   2  Hypercholesterolemia, doing well on rosuvastatin 10 mg a day. Lipid panel and hepatic panel today.   3 chronic anxiety, doing well on Xanax 0.5 mg 4 times daily long-term. Controlled substance contract and urine drug screen up-to-date.   4  Insomnia.  Doing good with long-term use of Ambien CR 12.5 mg nightly.  An After Visit Summary was printed and given to the patient.  FOLLOW UP: Return in about 6 months (around 11/07/2023) for routine chronic illness f/u.  Signed:  Santiago Bumpers, MD           05/09/2023

## 2023-05-17 ENCOUNTER — Other Ambulatory Visit: Payer: Self-pay | Admitting: Family Medicine

## 2023-05-19 ENCOUNTER — Other Ambulatory Visit: Payer: Self-pay | Admitting: Family Medicine

## 2023-07-13 DIAGNOSIS — J189 Pneumonia, unspecified organism: Secondary | ICD-10-CM | POA: Diagnosis not present

## 2023-07-13 DIAGNOSIS — R051 Acute cough: Secondary | ICD-10-CM | POA: Diagnosis not present

## 2023-07-18 ENCOUNTER — Other Ambulatory Visit: Payer: Self-pay | Admitting: Family Medicine

## 2023-07-24 ENCOUNTER — Other Ambulatory Visit: Payer: Self-pay | Admitting: Family Medicine

## 2023-07-29 ENCOUNTER — Other Ambulatory Visit: Payer: Self-pay | Admitting: Family Medicine

## 2023-08-04 ENCOUNTER — Other Ambulatory Visit: Payer: Self-pay | Admitting: Family Medicine

## 2023-08-28 ENCOUNTER — Other Ambulatory Visit: Payer: Self-pay | Admitting: Family Medicine

## 2023-09-14 DIAGNOSIS — J029 Acute pharyngitis, unspecified: Secondary | ICD-10-CM | POA: Diagnosis not present

## 2023-09-14 DIAGNOSIS — H7290 Unspecified perforation of tympanic membrane, unspecified ear: Secondary | ICD-10-CM | POA: Diagnosis not present

## 2023-09-14 DIAGNOSIS — H6641 Suppurative otitis media, unspecified, right ear: Secondary | ICD-10-CM | POA: Diagnosis not present

## 2023-09-18 ENCOUNTER — Encounter: Payer: Self-pay | Admitting: Urgent Care

## 2023-09-18 ENCOUNTER — Ambulatory Visit (INDEPENDENT_AMBULATORY_CARE_PROVIDER_SITE_OTHER): Admitting: Urgent Care

## 2023-09-18 VITALS — BP 130/82 | HR 79 | Temp 97.8°F | Wt 175.0 lb

## 2023-09-18 DIAGNOSIS — H7291 Unspecified perforation of tympanic membrane, right ear: Secondary | ICD-10-CM

## 2023-09-18 DIAGNOSIS — H6691 Otitis media, unspecified, right ear: Secondary | ICD-10-CM | POA: Diagnosis not present

## 2023-09-18 MED ORDER — CIPROFLOXACIN-DEXAMETHASONE 0.3-0.1 % OT SUSP: 4.0000 [drp] | Freq: Two times a day (BID) | OTIC | 0 refills | Status: DC

## 2023-09-18 MED ORDER — CEFDINIR 300 MG PO CAPS: 300.0000 mg | ORAL_CAPSULE | Freq: Two times a day (BID) | ORAL | 0 refills | Status: DC

## 2023-09-18 NOTE — Patient Instructions (Signed)
 Please stop the ofloxacin drops. Start ciprodex drops. 4 drops in the R ear canal twice daily x 7 days. We will extend your cefdinir for an additional 5 days.  Please schedule a 6 day follow up to ensure resolution.

## 2023-09-18 NOTE — Progress Notes (Signed)
 Established Patient Office Visit  Subjective:  Patient ID: Keith Sanders, male    DOB: [birth year]  Age: 73 y.o. MRN: [id number]  Chief Complaint  Patient presents with   Ear Injury    Pt was seen at urgent care on 2/28 for  right ear pain and was prescribed antibiotics. He feels like his ear drum ruptured do to drainage on his pillow case. He is still having pain and can not hear on that side    Otalgia  There is pain in the right ear. The current episode started in the past 7 days. The problem occurs constantly. The problem has been unchanged. The fever has been present for 1 to 2 days (had a fever on 2/28, but lasted one day). The pain is moderate. Associated symptoms include ear discharge, headaches (did have frontal sinus pain when sx started, has since resolved), hearing loss (R side only) and a sore throat (resolved). Pertinent negatives include no neck pain. He has tried ear drops and antibiotics for the symptoms. There is no history of a chronic ear infection, hearing loss or a tympanostomy tube.   Pt went to [facility] on 09/14/23 and was prescribed 5 days of 300mg  cefdinir (pt still has one pill left) and ofloxacin drops. States this really hasn't helped the ear, only the sinus pain. Reports even walking to the mailbox has caused pain in his R ear and due to drainage he cannot hear out of the R ear.  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 05/01/2014   HTN (hypertension), benign 06/16/2013   Anxiety state 06/16/2013   Chronic low back pain 03/05/2012   Health maintenance examination 03/05/2012   History of adenomatous polyp of colon    Elevated blood pressure reading without diagnosis of hypertension    Seborrheic dermatitis of scalp 02/27/2012   HERNIATED LUMBAR DISC 03/10/2009   Allergic rhinitis  12/30/2008   Backache 12/30/2008   ERECTILE DYSFUNCTION 02/20/2008   INSOMNIA, CHRONIC 02/20/2008   GERD 02/20/2008   GOUT NOS 03/20/2007   DIVERTICULITIS, COLON W/O HEM 03/19/2007   CONTACT DERMATITIS 02/21/2007   Past Medical History:  Diagnosis Date   Anxiety    BPPV (benign paroxysmal positional vertigo) 06/2014   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  MRI L-spine 2007 showed some bulging discs with nerve impingement.  Has never had back injection or surgery.   Contact dermatitis and eczema    COVID-19 virus infection 07/2020   Diverticulitis    Genital herpes 08/2015   HSV type I on culture   Gilbert syndrome    Gout    "always right big toe"--about one episode every 4 yrs.  Right ankle flare, 02/2016.   History of adenomatous polyp of colon 2005   History of bronchitis 08/11/2012   History of Clostridium difficile colitis 2016   HTN (hypertension)    started lisinopril 08/20/19   Hyperlipidemia, mixed    Statin started 04/2018   Insomnia    PUD (peptic ulcer disease)    SCC (squamous cell carcinoma), lip    mohs surgery 2024   Past Surgical History:  Procedure Laterality Date   COLONOSCOPY  07/2013   Normal.  Recall  10 yrs.   COLONOSCOPY W/ POLYPECTOMY  2005   Dr. Victorino Dike   Social History   Tobacco Use   Smoking status: Never   Smokeless tobacco: Never  Vaping Use   Vaping status: Never Used  Substance Use Topics   Alcohol use: Yes    Alcohol/week: 14.0 standard drinks of alcohol    Types: 14 Cans of beer per week   Drug use: No      ROS: as noted in HPI  Objective:     BP 130/82   Pulse 79   Temp 97.8 F (36.6 C) (Oral)   Wt 175 lb (79.4 kg)   SpO2 99%   BMI 25.11 kg/m  BP Readings from Last 3 Encounters:  09/18/23 130/82  05/09/23 129/83  11/07/22 (!) 140/84   Wt Readings from Last 3 Encounters:  09/18/23 175 lb (79.4 kg)   05/09/23 169 lb (76.7 kg)  03/07/23 172 lb (78 kg)      Physical Exam Vitals and nursing note reviewed.  Constitutional:      General: He is not in acute distress.    Appearance: Normal appearance. He is normal weight. He is not ill-appearing, toxic-appearing or diaphoretic.  HENT:     Head: Normocephalic and atraumatic.     Salivary Glands: Right salivary gland is not diffusely enlarged or tender. Left salivary gland is not diffusely enlarged or tender.     Right Ear: Decreased hearing noted. Drainage and swelling present. A middle ear effusion is present. No foreign body. No mastoid tenderness.     Left Ear: Tympanic membrane, ear canal and external ear normal.  No middle ear effusion. There is no impacted cerumen.     Ears:      Comments: Unable to view superior portion of R TM; inferior portion appears slightly opacified Neurological:     Mental Status: He is alert.      No results found for any visits on 09/18/23.  Last CBC Lab Results  Component Value Date   WBC 9.5 11/07/2022   HGB 14.7 11/07/2022   HCT 41.4 11/07/2022   MCV 95.4 11/07/2022   MCH 33.1 06/17/2014   RDW 13.0 11/07/2022   PLT 225.0 11/07/2022   Last metabolic panel Lab Results  Component Value Date   GLUCOSE 105 (H) 05/09/2023   NA 138 05/09/2023   K 3.9 05/09/2023   CL 104 05/09/2023   CO2 26 05/09/2023   BUN 14 05/09/2023   CREATININE 0.93 05/09/2023   GFR 83.40 05/09/2023   CALCIUM 9.1 05/09/2023   PHOS 2.8 02/27/2012   PROT 6.5 05/09/2023   ALBUMIN 4.4 05/09/2023   BILITOT 1.3 (H) 05/09/2023   ALKPHOS 72 05/09/2023   AST 24 05/09/2023   ALT 23 05/09/2023   ANIONGAP 13 06/17/2014      The 10-year ASCVD risk score (Arnett DK, et al., 2019) is: 19.2%  Assessment & Plan:  Acute otitis media of right ear with perforation -     Ciprofloxacin-dexAMETHasone; Place 4 drops into the right ear 2 (two) times daily.  Dispense: 7.5 mL; Refill: 0 -     Cefdinir; Take 1 capsule (300 mg  total) by mouth 2 (two) times daily.  Dispense: 10 capsule; Refill: 0   Will extend patients current abx therapy to total 10 days duration. Will stop ofloxacin drops and add ciprodex instead. Due to the location of the debris/ discharge, I am unable to view the entire TM. The appearance of the canal is  concerning however for possible polyp vs cholesteatoma. Will request that patient return in about 6 days after completion of therapy for recheck, with possible imaging pending response to therapy.  Return in about 6 days (around 09/24/2023).   Maretta Bees, PA

## 2023-09-24 ENCOUNTER — Ambulatory Visit (INDEPENDENT_AMBULATORY_CARE_PROVIDER_SITE_OTHER): Admitting: Urgent Care

## 2023-09-24 ENCOUNTER — Encounter: Payer: Self-pay | Admitting: Urgent Care

## 2023-09-24 VITALS — BP 143/87 | HR 77 | Wt 173.8 lb

## 2023-09-24 DIAGNOSIS — H6991 Unspecified Eustachian tube disorder, right ear: Secondary | ICD-10-CM

## 2023-09-24 MED ORDER — AZELASTINE HCL 0.1 % NA SOLN: 1.0000 | Freq: Two times a day (BID) | NASAL | 0 refills | Status: DC

## 2023-09-24 MED ORDER — PREDNISONE 50 MG PO TABS: 50.0000 mg | ORAL_TABLET | Freq: Every day | ORAL | 0 refills | Status: DC

## 2023-09-24 NOTE — Patient Instructions (Addendum)
 Your infection is gone, but your ear drum is retracted due to eustachian tube dysfunction. Please read the attached handout.  Please take prednisone once daily in the morning with breakfast Please continue your flonase, but also ADD azelastine nasal spray.  Please also purchase [facility] Med Hypertonic Saline spray and flush your nose.   If you do not see significant results in 4-5 days, please notify the office and we will place an ENT referral.

## 2023-09-24 NOTE — Progress Notes (Signed)
 Established Patient Office Visit  Subjective:  Patient ID: Keith Sanders, male    DOB: 07/28/1950  Age: 73 y.o. MRN: [id number]  Chief Complaint  Patient presents with   Follow-up    6 day follow up on ears. He is not having any pain but still can't hear.    Pt presents for follow up regarding recent R ear issues. Pt completed the ciprodex ear drops. States this has caused the drainage and pain to resolve, but is still having issues hearing out of the right ear. No fever or HA. Takes flonase daily for chronic allergic rhinitis. Was told at the start of his sx that his TM had ruptured.     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 05/01/2014   HTN (hypertension), benign 06/16/2013   Anxiety state 06/16/2013   Chronic low back pain 03/05/2012   Health maintenance examination 03/05/2012   History of adenomatous polyp of colon    Elevated blood pressure reading without diagnosis of hypertension    Seborrheic dermatitis of scalp 02/27/2012   HERNIATED LUMBAR DISC 03/10/2009   Allergic rhinitis 12/30/2008   Backache 12/30/2008   ERECTILE DYSFUNCTION 02/20/2008   INSOMNIA, CHRONIC 02/20/2008   GERD 02/20/2008   GOUT NOS 03/20/2007   DIVERTICULITIS, COLON W/O HEM 03/19/2007   CONTACT DERMATITIS 02/21/2007   Past Medical History:  Diagnosis Date   Anxiety    BPPV (benign paroxysmal positional vertigo) 06/2014   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  MRI L-spine 2007 showed some bulging discs with nerve impingement.  Has never had back injection or surgery.   Contact dermatitis and eczema    COVID-19 virus infection 07/2020   Diverticulitis    Genital herpes 08/2015   HSV type I on culture   Gilbert  syndrome    Gout    "always right big toe"--about one episode every 4 yrs.  Right ankle flare, 02/2016.   History of adenomatous polyp of colon 2005   History of bronchitis 08/11/2012   History of Clostridium difficile colitis 2016   HTN (hypertension)    started lisinopril 08/20/19   Hyperlipidemia, mixed    Statin started 04/2018   Insomnia    PUD (peptic ulcer disease)    SCC (squamous cell carcinoma), lip    mohs surgery 2024   Past Surgical History:  Procedure Laterality Date   COLONOSCOPY  07/2013   Normal.  Recall 10 yrs.   COLONOSCOPY W/ POLYPECTOMY  2005   Dr. Victorino Dike   Social History   Tobacco Use   Smoking status: Never   Smokeless tobacco: Never  Vaping Use   Vaping status: Never Used  Substance Use Topics   Alcohol use: Yes    Alcohol/week: 14.0 standard drinks of alcohol    Types: 14 Cans of beer per week   Drug use: No      ROS: as noted in HPI  Objective:     BP (!) 143/87   Pulse 77   Wt 173 lb 12.8 oz (78.8 kg)   SpO2 98%   BMI 24.94 kg/m  BP Readings from Last 3 Encounters:  09/24/23 (!) 143/87  09/18/23 130/82  05/09/23 129/83  Wt Readings from Last 3 Encounters:  09/24/23 173 lb 12.8 oz (78.8 kg)  09/18/23 175 lb (79.4 kg)  05/09/23 169 lb (76.7 kg)      Physical Exam Vitals and nursing note reviewed.  Constitutional:      General: He is not in acute distress.    Appearance: Normal appearance. He is normal weight. He is not ill-appearing, toxic-appearing or diaphoretic.  HENT:     Head: Normocephalic and atraumatic.     Right Ear: Decreased hearing noted. No swelling or tenderness. No middle ear effusion. There is no impacted cerumen. No foreign body. No mastoid tenderness. No PE tube. Tympanic membrane is scarred (entire TM opacified) and retracted. Tympanic membrane is not injected.     Left Ear: External ear normal.     Ears:      Nose: Nose normal. No congestion or rhinorrhea.     Right Nostril: No occlusion.     Left  Nostril: No occlusion.     Right Turbinates: Not enlarged or swollen.     Left Turbinates: Not enlarged or swollen.     Right Sinus: No maxillary sinus tenderness.     Left Sinus: No maxillary sinus tenderness.  Eyes:     General: No scleral icterus.       Right eye: No discharge.        Left eye: No discharge.  Cardiovascular:     Rate and Rhythm: Normal rate.  Pulmonary:     Effort: Pulmonary effort is normal. No respiratory distress.  Musculoskeletal:     Cervical back: Normal range of motion. No rigidity.  Lymphadenopathy:     Cervical: No cervical adenopathy.  Skin:    General: Skin is warm and dry.     Findings: No erythema.  Neurological:     General: No focal deficit present.     Mental Status: He is alert and oriented to person, place, and time.      No results found for any visits on 09/24/23.  Last CBC Lab Results  Component Value Date   WBC 9.5 11/07/2022   HGB 14.7 11/07/2022   HCT 41.4 11/07/2022   MCV 95.4 11/07/2022   MCH 33.1 06/17/2014   RDW 13.0 11/07/2022   PLT 225.0 11/07/2022   Last metabolic panel Lab Results  Component Value Date   GLUCOSE 105 (H) 05/09/2023   NA 138 05/09/2023   K 3.9 05/09/2023   CL 104 05/09/2023   CO2 26 05/09/2023   BUN 14 05/09/2023   CREATININE 0.93 05/09/2023   GFR 83.40 05/09/2023   CALCIUM 9.1 05/09/2023   PHOS 2.8 02/27/2012   PROT 6.5 05/09/2023   ALBUMIN 4.4 05/09/2023   BILITOT 1.3 (H) 05/09/2023   ALKPHOS 72 05/09/2023   AST 24 05/09/2023   ALT 23 05/09/2023   ANIONGAP 13 06/17/2014      The 10-year ASCVD risk score (Arnett DK, et al., 2019) is: 22.4%  Assessment & Plan:  Eustachian tube dysfunction, right -     predniSONE; Take 1 tablet (50 mg total) by mouth daily with breakfast.  Dispense: 5 tablet; Refill: 0 -     Azelastine HCl; Place 1 spray into both nostrils 2 (two) times daily. Use in each nostril as directed  Dispense: 30 mL; Refill: 0  Infection appears completely resolved. The TM  however is retracted and opacified, indicating ET dysfunction. Discussed presdnisone. Listed as an "allergy" however pt states he can, and has, taken in the past. Reports it  works well for him but causes him to get "mean" and have insomnia. Discussed tx options, which pt agrees to try the prednisone. Continue with flonase, will add azelastine and OTC hypertonic saline rinse. Will refer to ENT if no resolution to R TM / hearing within the next 5 days.   No follow-ups on file.   Maretta Bees, PA

## 2023-10-10 ENCOUNTER — Other Ambulatory Visit: Payer: Self-pay | Admitting: Family Medicine

## 2023-10-10 MED ORDER — ZOLPIDEM TARTRATE ER 12.5 MG PO TBCR: EXTENDED_RELEASE_TABLET | ORAL | 0 refills | Status: DC

## 2023-10-10 NOTE — Telephone Encounter (Signed)
 Copied from CRM [id number]. Topic: Clinical - Medication Refill >> Oct 10, 2023  9:42 AM Marica Otter wrote: Most Recent Primary Care Visit:  Provider: Maretta Bees  Department: [facility]  Visit Type: OFFICE VISIT  Date: 09/24/2023  Medication: zolpidem (AMBIEN CR) 12.5 MG CR tablet  Has the patient contacted their pharmacy? Yes, no refills at that location (Agent: If no, request that the patient contact the pharmacy for the refill. If patient does not wish to contact the pharmacy document the reason why and proceed with request.) (Agent: If yes, when and what did the pharmacy advise?)  Is this the correct pharmacy for this prescription? Yes If no, delete pharmacy and type the correct one.  This is the patient's preferred pharmacy:  [id number] [place], [place] - [address] [place] [address] Phone: [phone] Fax: [phone]   Has the prescription been filled recently? No  Is the patient out of the medication? Yes  Has the patient been seen for an appointment in the last year OR does the patient have an upcoming appointment? Yes  Can we respond through MyChart? Yes  Agent: Please be advised that Rx refills may take up to 3 business days. We ask that you follow-up with your pharmacy.

## 2023-10-10 NOTE — Telephone Encounter (Signed)
 Requesting: zolpidem  Contract: 03/29/22 UDS: 09/20/21 Last Visit: 09/24/23 acute Next Visit: 11/07/23 Last Refill: 04/16/23 (90,1)  Please Advise. Med pending

## 2023-10-10 NOTE — Telephone Encounter (Signed)
 Pt has appt scheduled 11/07/23

## 2023-10-10 NOTE — Telephone Encounter (Signed)
#  90 with no RF sent today. Needs to see me next month

## 2023-10-16 ENCOUNTER — Other Ambulatory Visit: Payer: Self-pay

## 2023-10-16 DIAGNOSIS — H6991 Unspecified Eustachian tube disorder, right ear: Secondary | ICD-10-CM

## 2023-10-23 ENCOUNTER — Other Ambulatory Visit: Payer: Self-pay

## 2023-10-23 DIAGNOSIS — H6991 Unspecified Eustachian tube disorder, right ear: Secondary | ICD-10-CM

## 2023-10-23 MED ORDER — AZELASTINE HCL 0.1 % NA SOLN: 1.0000 | Freq: Two times a day (BID) | NASAL | 0 refills | Status: DC

## 2023-11-01 ENCOUNTER — Other Ambulatory Visit: Payer: Self-pay | Admitting: Family Medicine

## 2023-11-07 ENCOUNTER — Ambulatory Visit (INDEPENDENT_AMBULATORY_CARE_PROVIDER_SITE_OTHER): Payer: Medicare Other | Admitting: Family Medicine

## 2023-11-07 ENCOUNTER — Encounter: Payer: Self-pay | Admitting: Family Medicine

## 2023-11-07 VITALS — BP 126/77 | HR 76 | Temp 98.0°F | Ht 70.0 in | Wt 173.6 lb

## 2023-11-07 DIAGNOSIS — Z79899 Other long term (current) drug therapy: Secondary | ICD-10-CM | POA: Diagnosis not present

## 2023-11-07 DIAGNOSIS — F411 Generalized anxiety disorder: Secondary | ICD-10-CM

## 2023-11-07 DIAGNOSIS — E7800 Pure hypercholesterolemia, unspecified: Secondary | ICD-10-CM

## 2023-11-07 DIAGNOSIS — J0101 Acute recurrent maxillary sinusitis: Secondary | ICD-10-CM

## 2023-11-07 DIAGNOSIS — M7021 Olecranon bursitis, right elbow: Secondary | ICD-10-CM

## 2023-11-07 DIAGNOSIS — F5101 Primary insomnia: Secondary | ICD-10-CM

## 2023-11-07 DIAGNOSIS — I10 Essential (primary) hypertension: Secondary | ICD-10-CM

## 2023-11-07 DIAGNOSIS — Z125 Encounter for screening for malignant neoplasm of prostate: Secondary | ICD-10-CM

## 2023-11-07 DIAGNOSIS — H6504 Acute serous otitis media, recurrent, right ear: Secondary | ICD-10-CM | POA: Diagnosis not present

## 2023-11-07 LAB — BASIC METABOLIC PANEL WITH GFR
BUN: 13 mg/dL (ref 6–23)
CO2: 27 meq/L (ref 19–32)
Calcium: 9.2 mg/dL (ref 8.4–10.5)
Chloride: 106 meq/L (ref 96–112)
Creatinine, Ser: 0.93 mg/dL (ref 0.40–1.50)
GFR: 83.1 mL/min (ref >60.00)
Glucose, Bld: 98 mg/dL (ref 70–99)
Potassium: 4.1 meq/L (ref 3.5–5.1)
Sodium: 139 meq/L (ref 135–145)

## 2023-11-07 LAB — LIPID PANEL
Cholesterol: 151 mg/dL (ref 0–200)
HDL: 48.5 mg/dL (ref >39.00)
LDL Cholesterol: 71 mg/dL (ref 0–99)
NonHDL: 102.27
Total CHOL/HDL Ratio: 3
Triglycerides: 158 mg/dL — ABNORMAL HIGH (ref 0.0–149.0)
VLDL: 31.6 mg/dL (ref 0.0–40.0)

## 2023-11-07 LAB — PSA, MEDICARE: PSA: 0.28 ng/mL (ref 0.10–4.00)

## 2023-11-07 MED ORDER — ALPRAZOLAM 0.5 MG PO TABS: ORAL_TABLET | ORAL | 5 refills | Status: DC

## 2023-11-07 MED ORDER — ZOLPIDEM TARTRATE ER 12.5 MG PO TBCR
EXTENDED_RELEASE_TABLET | ORAL | 0 refills | Status: DC
Start: 2023-11-07 — End: 2024-04-10

## 2023-11-07 MED ORDER — CEFDINIR 300 MG PO CAPS: 300.0000 mg | ORAL_CAPSULE | Freq: Two times a day (BID) | ORAL | 0 refills | Status: DC

## 2023-11-07 NOTE — Progress Notes (Signed)
 OFFICE VISIT  11/07/2023  CC:  Chief Complaint  Patient presents with  . Medical Management of Chronic Issues    Pt is fasting    Patient is a 73 y.o. male who presents for 17-month follow-up insomnia, hypertension, hyperlipidemia, and anxiety. A/P as of last visit: "1 hypertension, well-controlled on lisinopril  30 mg a day. Electrolytes and creatinine today.   2  Hypercholesterolemia, doing well on rosuvastatin  10 mg a day. Lipid panel and hepatic panel today.   3 chronic anxiety, doing well on Xanax  0.5 mg 4 times daily long-term. Controlled substance contract and urine drug screen up-to-date.   4  Insomnia.  Doing good with long-term use of Ambien  CR 12.5 mg nightly."  INTERIM HX: Keith Sanders had sinus infection and right otitis media about 2 months ago.  His right tympanic membrane ruptured.  He was given antibiotics and eventually all symptoms resolved except that he continues to hear his heartbeat in the right ear.  Hearing is intact. No throbbing.  No drainage.  He still has some right maxillary and frontal sinus pressure and some yellow thick mucus from the right nostril. He is using Astelin  and an over-the-counter antihistamine as well as some Mucinex  lately. No cough, fever, or malaise.  Around 6 to 8 weeks ago he also developed some pain in the right elbow.  It swelled up over the back of the elbow.  He has been resting elbows on a desk 1 typing more lately.  The symptoms have resolved significantly and he just has a little bit of swelling now. No recent trauma to the elbow.  Elbow range of motion fully intact.   PMP AWARE reviewed today: most recent rx for alprazolam  was filled for 10/27/2023, # 120, rx by me.  Most recent Ambien  prescription filled 10/10/2023, #90, prescription by me. No red flags.  Past Medical History:  Diagnosis Date  . Anxiety   . BPPV (benign paroxysmal positional vertigo) 06/2014  . 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 Chronic  insomnia   . Chronic lumbar pain    DDD; ibuprofen , occasional muscle relaxers.  MRI L-spine 2007 showed some bulging discs with nerve impingement.  Has never had back injection or surgery.  . Contact dermatitis and eczema   . COVID-19 virus infection 07/2020  . Diverticulitis   . Genital herpes 08/2015   HSV type I on culture  . Gilbert syndrome   . Gout    "always right big toe"--about one episode every 4 yrs.  Right ankle flare, 02/2016.  Aaron Aas History of adenomatous polyp of colon 2005  . History of bronchitis 08/11/2012  . History of Clostridium difficile colitis 2016  . HTN (hypertension)    started lisinopril  08/20/19  . Hyperlipidemia, mixed    Statin started 04/2018  . Insomnia   . PUD (peptic ulcer disease)   . SCC (squamous cell carcinoma), lip    mohs surgery 2024    Past Surgical History:  Procedure Laterality Date  . COLONOSCOPY  07/2013   Normal.  Recall 10 yrs.  . COLONOSCOPY W/ POLYPECTOMY  2005   Dr. Nelson Bandy    Outpatient Medications Prior to Visit  Medication Sig Dispense Refill  . azelastine  (ASTELIN ) 0.1 % nasal spray Place 1 spray into both nostrils 2 (two) times daily. Use in each nostril as directed 30 mL 0  . Colchicine  (MITIGARE ) 0.6 MG CAPS 2 caps po at onset of gout flare, then take another 1 cap 1 hour later  30 capsule 2  . fluticasone  (CUTIVATE ) 0.05 % cream Apply topically 2 (two) times daily. 30 g 2  . ibuprofen  (ADVIL ) 800 MG tablet TAKE 1 TABLET BY MOUTH THREE TIMES DAILY WITH FOOD AS NEEDED FOR PAIN. MUST KEEP APPT FOR FURTHER REFILLS 90 tablet 0  . ketoconazole  (NIZORAL ) 2 % shampoo Apply to scalp every other day as needed for scalp seborrheic dermatitis 120 mL 11  . lisinopril  (ZESTRIL ) 30 MG tablet TAKE 1 TABLET BY MOUTH EVERY DAY 90 tablet 2  . Multiple Vitamin (MULTIVITAMIN) tablet Take 1 tablet by mouth daily.    . pantoprazole  (PROTONIX ) 40 MG tablet TAKE 1 TABLET(40 MG) BY MOUTH EVERY MORNING 90 tablet 1  . rosuvastatin  (CRESTOR ) 10 MG  tablet TAKE 1 TABLET BY MOUTH EVERY DAY 90 tablet 1  . ALPRAZolam  (XANAX ) 0.5 MG tablet TAKE 1 TABLET BY MOUTH FOUR TIMES A DAY AS NEEDED 120 tablet 5  . cefdinir  (OMNICEF ) 300 MG capsule Take 1 capsule (300 mg total) by mouth 2 (two) times daily. 10 capsule 0  . ciprofloxacin -dexamethasone  (CIPRODEX ) OTIC suspension Place 4 drops into the right ear 2 (two) times daily. 7.5 mL 0  . fluticasone  (CUTIVATE ) 0.05 % cream Apply topically 2 (two) times daily. 30 g 3  . predniSONE  (DELTASONE ) 50 MG tablet Take 1 tablet (50 mg total) by mouth daily with breakfast. 5 tablet 0  . zolpidem  (AMBIEN  CR) 12.5 MG CR tablet TAKE 1 TABLET BY MOUTH EVERY DAY AT BEDTIME AS NEEDED 90 tablet 0   No facility-administered medications prior to visit.    Allergies  Allergen Reactions  . Prednisone  Other (See Comments)    Insomnia, elevated BP and HR  . Augmentin [Amoxicillin-Pot Clavulanate] Diarrhea    Upset stomach    Review of Systems As per HPI  PE:    11/07/2023    8:27 AM 09/24/2023    8:20 AM 09/18/2023    9:43 AM  Vitals with BMI  Height 5\' 10"     Weight 173 lbs 10 oz 173 lbs 13 oz 175 lbs  BMI 24.91    Systolic 126 143 161  Diastolic 77 87 82  Pulse 76 77 79   Physical Exam  General: Alert and well-appearing. Right TM with a bit of clear fluid behind it.  No erythema or bulging.  TM is fully intact.  He has a bit of sinus pressure with palpation on the right side.  No purulent mucus in the nostrils. Right elbow with a barely palpable amount of soft tissue fullness over the olecranon.  No fluctuance to suggest any fluid.  No warmth, erythema, or tenderness.  Range of motion fully intact.  LABS:  Last CBC Lab Results  Component Value Date   WBC 9.5 11/07/2022   HGB 14.7 11/07/2022   HCT 41.4 11/07/2022   MCV 95.4 11/07/2022   MCH 33.1 06/17/2014   RDW 13.0 11/07/2022   PLT 225.0 11/07/2022   Last metabolic panel Lab Results  Component Value Date   GLUCOSE 105 (H) 05/09/2023   NA  138 05/09/2023   K 3.9 05/09/2023   CL 104 05/09/2023   CO2 26 05/09/2023   BUN 14 05/09/2023   CREATININE 0.93 05/09/2023   GFR 83.40 05/09/2023   CALCIUM  9.1 05/09/2023   PHOS 2.8 02/27/2012   PROT 6.5 05/09/2023   ALBUMIN 4.4 05/09/2023   BILITOT 1.3 (H) 05/09/2023   ALKPHOS 72 05/09/2023   AST 24 05/09/2023   ALT 23 05/09/2023  ANIONGAP 13 06/17/2014   Last lipids Lab Results  Component Value Date   CHOL 173 05/09/2023   HDL 54.60 05/09/2023   LDLCALC 60 05/09/2023   LDLDIRECT 94.0 08/07/2022   TRIG 289.0 (H) 05/09/2023   CHOLHDL 3 05/09/2023   Last thyroid  functions Lab Results  Component Value Date   TSH 3.74 04/22/2018   Lab Results  Component Value Date   PSA 0.25 11/07/2022   PSA 0.18 09/20/2021   PSA 0.27 09/13/2020    IMPRESSION AND PLAN:  1 hypertension, well-controlled on lisinopril  30 mg a day. Electrolytes and creatinine today.   2  Hypercholesterolemia, doing well on rosuvastatin  10 mg a day. Lipid panel and hepatic panel today.   3 chronic anxiety, doing well on Xanax  0.5 mg 4 times daily long-term. Controlled substance contract and urine drug screen up-to-date.   #4 olecranon bursitis, right elbow.  Resolving. Signs/symptoms to call or return for were reviewed and pt expressed understanding.  #5 persistent right sinusitis with right sided ear symptom (hears his heartbeat).  Question some remaining serous otitis on the right. Will do 1 week of Omnicef .  He will continue Astelin  and daily antihistamine.  #5 insomnia.  Doing good with long-term use of Ambien  CR 12.5 mg nightly  #6 preventative health care:  Vaccines: Shingrix-->declined. otherwise up-to-date. Labs: BMet, lipids, PSA Prostate ca screening: PSA today Colon ca screening: recall 2025.  An After Visit Summary was printed and given to the patient.  FOLLOW UP: Return in about 6 months (around 05/08/2024) for routine chronic illness f/u.  Signed:  Arletha Lady, MD            11/07/2023

## 2023-11-08 ENCOUNTER — Encounter: Payer: Self-pay | Admitting: Family Medicine

## 2023-11-10 LAB — DRUG MONITORING PANEL 376104, URINE
Alphahydroxyalprazolam: NEGATIVE ng/mL (ref <25)
Alphahydroxymidazolam: NEGATIVE ng/mL (ref <50)
Alphahydroxytriazolam: NEGATIVE ng/mL (ref <50)
Aminoclonazepam: NEGATIVE ng/mL (ref <25)
Amphetamines: NEGATIVE ng/mL (ref <500)
Barbiturates: NEGATIVE ng/mL (ref <300)
Benzodiazepines: NEGATIVE ng/mL (ref <100)
Cocaine Metabolite: NEGATIVE ng/mL (ref <150)
Desmethyltramadol: NEGATIVE ng/mL (ref <100)
Hydroxyethylflurazepam: NEGATIVE ng/mL (ref <50)
Lorazepam: NEGATIVE ng/mL (ref <50)
Nordiazepam: NEGATIVE ng/mL (ref <50)
Opiates: NEGATIVE ng/mL (ref <100)
Oxazepam: NEGATIVE ng/mL (ref <50)
Oxycodone: NEGATIVE ng/mL (ref <100)
Temazepam: NEGATIVE ng/mL (ref <50)
Tramadol: NEGATIVE ng/mL (ref <100)

## 2023-11-10 LAB — DM TEMPLATE

## 2023-11-21 ENCOUNTER — Other Ambulatory Visit: Payer: Self-pay

## 2023-11-21 DIAGNOSIS — H6991 Unspecified Eustachian tube disorder, right ear: Secondary | ICD-10-CM

## 2023-11-21 MED ORDER — AZELASTINE HCL 0.1 % NA SOLN: 1.0000 | Freq: Two times a day (BID) | NASAL | 1 refills | Status: DC

## 2023-11-29 ENCOUNTER — Other Ambulatory Visit: Payer: Self-pay | Admitting: Family Medicine

## 2023-12-20 ENCOUNTER — Other Ambulatory Visit: Payer: Self-pay | Admitting: Family Medicine

## 2023-12-28 ENCOUNTER — Other Ambulatory Visit: Payer: Self-pay | Admitting: Family Medicine

## 2024-01-21 ENCOUNTER — Other Ambulatory Visit: Payer: Self-pay

## 2024-01-21 DIAGNOSIS — H6991 Unspecified Eustachian tube disorder, right ear: Secondary | ICD-10-CM

## 2024-01-21 MED ORDER — AZELASTINE HCL 0.1 % NA SOLN
1.0000 | Freq: Two times a day (BID) | NASAL | 1 refills | Status: DC
Start: 2024-01-21 — End: 2024-03-10

## 2024-01-22 ENCOUNTER — Telehealth: Payer: Self-pay

## 2024-01-22 DIAGNOSIS — Z1211 Encounter for screening for malignant neoplasm of colon: Secondary | ICD-10-CM

## 2024-01-22 NOTE — Telephone Encounter (Signed)
 Yes pls order iFOB.  Dx colon cancer screening. Thx

## 2024-01-22 NOTE — Telephone Encounter (Signed)
 Reason for CRM: Patient is calling because he received a message about his colonscopy  He would like to pick up the - iFOB kit

## 2024-01-23 NOTE — Telephone Encounter (Signed)
 LVM for pt to return call

## 2024-01-23 NOTE — Telephone Encounter (Signed)
 Patient returned CMA call and advised cologuard has higher sensitivity for detecting blood in the stool and explained the process of receiving and returning kit. Pt agreed, order placed.

## 2024-01-28 ENCOUNTER — Other Ambulatory Visit: Payer: Self-pay | Admitting: Family Medicine

## 2024-02-03 ENCOUNTER — Other Ambulatory Visit: Payer: Self-pay | Admitting: Family Medicine

## 2024-02-06 ENCOUNTER — Other Ambulatory Visit: Payer: Self-pay

## 2024-02-06 DIAGNOSIS — Z1211 Encounter for screening for malignant neoplasm of colon: Secondary | ICD-10-CM

## 2024-02-07 DIAGNOSIS — Z48817 Encounter for surgical aftercare following surgery on the skin and subcutaneous tissue: Secondary | ICD-10-CM | POA: Diagnosis not present

## 2024-02-11 ENCOUNTER — Other Ambulatory Visit: Payer: Self-pay | Admitting: Family Medicine

## 2024-02-15 ENCOUNTER — Other Ambulatory Visit: Payer: Self-pay | Admitting: Family Medicine

## 2024-02-18 DIAGNOSIS — Z1211 Encounter for screening for malignant neoplasm of colon: Secondary | ICD-10-CM | POA: Diagnosis not present

## 2024-03-01 LAB — COLOGUARD: COLOGUARD: POSITIVE — AB

## 2024-03-03 ENCOUNTER — Ambulatory Visit: Payer: Self-pay | Admitting: Family Medicine

## 2024-03-03 ENCOUNTER — Encounter: Payer: Self-pay | Admitting: Family Medicine

## 2024-03-03 DIAGNOSIS — R195 Other fecal abnormalities: Secondary | ICD-10-CM

## 2024-03-04 NOTE — Telephone Encounter (Signed)
 Left pt a VM to discuss

## 2024-03-09 ENCOUNTER — Other Ambulatory Visit: Payer: Self-pay | Admitting: Family Medicine

## 2024-03-15 ENCOUNTER — Other Ambulatory Visit: Payer: Self-pay | Admitting: Family Medicine

## 2024-03-18 NOTE — Telephone Encounter (Signed)
 No further action needed.

## 2024-03-20 ENCOUNTER — Ambulatory Visit (AMBULATORY_SURGERY_CENTER)

## 2024-03-20 VITALS — Ht 70.0 in | Wt 174.0 lb

## 2024-03-20 DIAGNOSIS — Z1211 Encounter for screening for malignant neoplasm of colon: Secondary | ICD-10-CM

## 2024-03-20 MED ORDER — NA SULFATE-K SULFATE-MG SULF 17.5-3.13-1.6 GM/177ML PO SOLN: 1.0000 | Freq: Once | ORAL | 0 refills | Status: AC

## 2024-03-20 NOTE — Progress Notes (Signed)
 Pre visit completed via phone call; Patient verified name, DOB, and address; No egg or soy allergy known to patient;  No issues known to pt with past sedation with any surgeries or procedures; Patient denies ever being told they had issues or difficulty with intubation;  No FH of Malignant Hyperthermia; Pt is not on diet pills; Pt is not on home 02;  Pt is not on blood thinners;  Pt denies issues with constipation;  No A fib or A flutter; Have any cardiac testing pending--NO Insurance verified during PV appt--- Medicare Pt can ambulate without assistance;  Pt denies use of chewing tobacco; Discussed diabetic/weight loss medication holds; Discussed NSAID holds; Checked BMI to be less than 50; Pt instructed to use Single[employer] or GoodRx for a price reduction on prep;  Patient's chart reviewed by Norleen Schillings CNRA prior to previsit and patient appropriate for the [facility];  Pre visit completed and red dot placed by patient's name on their procedure day (on provider's schedule);  Instructions printed and given to patient at time of PV as patient came into office at the end of the [facility] phone call;

## 2024-03-25 DIAGNOSIS — L821 Other seborrheic keratosis: Secondary | ICD-10-CM | POA: Diagnosis not present

## 2024-03-25 DIAGNOSIS — L218 Other seborrheic dermatitis: Secondary | ICD-10-CM | POA: Diagnosis not present

## 2024-03-25 DIAGNOSIS — Z85828 Personal history of other malignant neoplasm of skin: Secondary | ICD-10-CM | POA: Diagnosis not present

## 2024-03-25 DIAGNOSIS — Z08 Encounter for follow-up examination after completed treatment for malignant neoplasm: Secondary | ICD-10-CM | POA: Diagnosis not present

## 2024-03-25 DIAGNOSIS — D1801 Hemangioma of skin and subcutaneous tissue: Secondary | ICD-10-CM | POA: Diagnosis not present

## 2024-03-25 DIAGNOSIS — L814 Other melanin hyperpigmentation: Secondary | ICD-10-CM | POA: Diagnosis not present

## 2024-03-26 ENCOUNTER — Encounter

## 2024-04-02 ENCOUNTER — Ambulatory Visit (AMBULATORY_SURGERY_CENTER): Admitting: Gastroenterology

## 2024-04-02 ENCOUNTER — Encounter: Payer: Self-pay | Admitting: Gastroenterology

## 2024-04-02 VITALS — BP 146/77 | HR 65 | Temp 97.9°F | Resp 18 | Ht 70.0 in | Wt 174.0 lb

## 2024-04-02 DIAGNOSIS — K648 Other hemorrhoids: Secondary | ICD-10-CM | POA: Diagnosis not present

## 2024-04-02 DIAGNOSIS — R195 Other fecal abnormalities: Secondary | ICD-10-CM

## 2024-04-02 DIAGNOSIS — D125 Benign neoplasm of sigmoid colon: Secondary | ICD-10-CM

## 2024-04-02 DIAGNOSIS — I10 Essential (primary) hypertension: Secondary | ICD-10-CM | POA: Diagnosis not present

## 2024-04-02 DIAGNOSIS — Z1211 Encounter for screening for malignant neoplasm of colon: Secondary | ICD-10-CM | POA: Diagnosis not present

## 2024-04-02 DIAGNOSIS — F419 Anxiety disorder, unspecified: Secondary | ICD-10-CM | POA: Diagnosis not present

## 2024-04-02 DIAGNOSIS — E785 Hyperlipidemia, unspecified: Secondary | ICD-10-CM | POA: Diagnosis not present

## 2024-04-02 MED ORDER — SODIUM CHLORIDE 0.9 % IV SOLN: 500.0000 mL | Freq: Once | INTRAVENOUS | Status: DC

## 2024-04-02 NOTE — Progress Notes (Signed)
 [facility] History and Physical   Primary Care Physician:  Candise Aleene DEL, MD   Reason for Procedure:  Positive cologuard  Plan:    colonoscopy     HPI: Keith Sanders is a 73 y.o. male  here for colonoscopy - last exam 07/2013 was normal - he had a positive cologuard recently. Remotely had a polyp?   Patient denies any bowel symptoms at this time. No family history of colon cancer known. Otherwise feels well without any cardiopulmonary symptoms.   I have discussed risks / benefits of anesthesia and endoscopic procedure with Keith Sanders and they wish to proceed with the exams as outlined today.    Past Medical History:  Diagnosis Date   Anxiety    BPPV (benign paroxysmal positional vertigo) 06/2014   C. difficile colitis 2018   episode of c. diff caused by Augmentin medication   Calcific Achilles tendinitis of left lower extremity    Chronic insomnia    Chronic lumbar pain    DDD; ibuprofen , occasional muscle relaxers.  MRI L-spine 2007 showed some bulging discs with nerve impingement.  Has never had back injection or surgery.   Colon cancer screening    cologuard POS 02/2024   Contact dermatitis and eczema    COVID-19 virus infection 07/2020   Diverticulitis    Genital herpes 08/2015   HSV type I on culture   Bertrum syndrome    Gout    always right big toe--about one episode every 4 yrs.  Right ankle flare, 02/2016.   History of adenomatous polyp of colon 2005   History of bronchitis 08/11/2012   History of Clostridium difficile colitis 2016   HTN (hypertension)    started lisinopril  08/20/19   Hyperlipidemia, mixed    Statin started 04/2018   Insomnia    PUD (peptic ulcer disease)    SCC (squamous cell carcinoma), lip    mohs surgery 2024    Past Surgical History:  Procedure Laterality Date   COLONOSCOPY  07/17/2013   Normal.  Recall 10 yrs.-RK-suprep(exc)   COLONOSCOPY W/ POLYPECTOMY  07/18/2003   Dr. Sheppard Finn    Prior to Admission  medications   Medication Sig Start Date End Date Taking? Authorizing Provider  ALPRAZolam  (XANAX ) 0.5 MG tablet TAKE 1 TABLET BY MOUTH FOUR TIMES A DAY AS NEEDED Patient taking differently: Take 0.5 mg by mouth daily as needed. TAKE 1 TABLET BY MOUTH FOUR TIMES A DAY AS NEEDED 11/07/23  Yes McGowen, Aleene DEL, MD  Azelastine  HCl 137 MCG/SPRAY SOLN PLACE 1 SPRAY INTO BOTH NOSTRILS 2 (TWO) TIMES DAILY. USE IN EACH NOSTRIL AS DIRECTED Patient taking differently: Place 1 spray into the nose daily as needed. 03/10/24  Yes McGowen, Aleene DEL, MD  fluticasone  (CUTIVATE ) 0.05 % cream Apply topically 2 (two) times daily. Patient taking differently: Apply 1 Application topically 2 (two) times daily as needed. 05/09/23  Yes McGowen, Aleene DEL, MD  ketoconazole  (NIZORAL ) 2 % shampoo Apply to scalp every other day as needed for scalp seborrheic dermatitis Patient taking differently: Apply 1 Application topically daily as needed. Apply to scalp every other day as needed for scalp seborrheic dermatitis 05/09/23  Yes McGowen, Aleene DEL, MD  pantoprazole  (PROTONIX ) 40 MG tablet TAKE 1 TABLET(40 MG) BY MOUTH EVERY MORNING Patient taking differently: Take 40 mg by mouth daily. TAKE 1 TABLET(40 MG) BY MOUTH EVERY MORNING 01/28/24  Yes McGowen, Aleene DEL, MD  zolpidem  (AMBIEN  CR) 12.5 MG CR tablet TAKE 1 TABLET BY  MOUTH EVERY DAY AT BEDTIME AS NEEDED Patient taking differently: Take 12.5 mg by mouth at bedtime. TAKE 1 TABLET BY MOUTH EVERY DAY AT BEDTIME AS NEEDED 11/07/23  Yes McGowen, Aleene DEL, MD  Colchicine  0.6 MG CAPS 2 CAPS BY MOUTH AT ONSET OF GOUT FLARE, THEN TAKE ANOTHER 1 CAP 1 HOUR LATER Patient taking differently: Take 2 capsules by mouth daily as needed. 2 CAPS BY MOUTH AT ONSET OF GOUT FLARE, THEN TAKE ANOTHER 1 CAP 1 HOUR LATER 12/21/23   McGowen, Aleene DEL, MD  ibuprofen  (ADVIL ) 800 MG tablet TAKE 1 TABLET BY MOUTH THREE TIMES DAILY WITH FOOD AS NEEDED FOR PAIN Patient taking differently: Take 800 mg by mouth every  8 (eight) hours as needed. TAKE 1 TABLET BY MOUTH THREE TIMES DAILY WITH FOOD AS NEEDED FOR PAIN. 03/18/24   McGowen, Aleene DEL, MD  lisinopril  (ZESTRIL ) 30 MG tablet TAKE 1 TABLET BY MOUTH EVERY DAY 01/28/24   McGowen, Aleene DEL, MD  Multiple Vitamin (MULTIVITAMIN) tablet Take 1 tablet by mouth daily.    [provider]  rosuvastatin  (CRESTOR ) 10 MG tablet TAKE 1 TABLET BY MOUTH EVERY DAY 01/28/24   McGowen, Aleene DEL, MD    Current Outpatient Medications  Medication Sig Dispense Refill   ALPRAZolam  (XANAX ) 0.5 MG tablet TAKE 1 TABLET BY MOUTH FOUR TIMES A DAY AS NEEDED (Patient taking differently: Take 0.5 mg by mouth daily as needed. TAKE 1 TABLET BY MOUTH FOUR TIMES A DAY AS NEEDED) 120 tablet 5   Azelastine  HCl 137 MCG/SPRAY SOLN PLACE 1 SPRAY INTO BOTH NOSTRILS 2 (TWO) TIMES DAILY. USE IN EACH NOSTRIL AS DIRECTED (Patient taking differently: Place 1 spray into the nose daily as needed.) 90 mL 0   fluticasone  (CUTIVATE ) 0.05 % cream Apply topically 2 (two) times daily. (Patient taking differently: Apply 1 Application topically 2 (two) times daily as needed.) 30 g 2   ketoconazole  (NIZORAL ) 2 % shampoo Apply to scalp every other day as needed for scalp seborrheic dermatitis (Patient taking differently: Apply 1 Application topically daily as needed. Apply to scalp every other day as needed for scalp seborrheic dermatitis) 120 mL 11   pantoprazole  (PROTONIX ) 40 MG tablet TAKE 1 TABLET(40 MG) BY MOUTH EVERY MORNING (Patient taking differently: Take 40 mg by mouth daily. TAKE 1 TABLET(40 MG) BY MOUTH EVERY MORNING) 90 tablet 0   zolpidem  (AMBIEN  CR) 12.5 MG CR tablet TAKE 1 TABLET BY MOUTH EVERY DAY AT BEDTIME AS NEEDED (Patient taking differently: Take 12.5 mg by mouth at bedtime. TAKE 1 TABLET BY MOUTH EVERY DAY AT BEDTIME AS NEEDED) 90 tablet 0   Colchicine  0.6 MG CAPS 2 CAPS BY MOUTH AT ONSET OF GOUT FLARE, THEN TAKE ANOTHER 1 CAP 1 HOUR LATER (Patient taking differently: Take 2 capsules by mouth  daily as needed. 2 CAPS BY MOUTH AT ONSET OF GOUT FLARE, THEN TAKE ANOTHER 1 CAP 1 HOUR LATER) 30 capsule 2   ibuprofen  (ADVIL ) 800 MG tablet TAKE 1 TABLET BY MOUTH THREE TIMES DAILY WITH FOOD AS NEEDED FOR PAIN (Patient taking differently: Take 800 mg by mouth every 8 (eight) hours as needed. TAKE 1 TABLET BY MOUTH THREE TIMES DAILY WITH FOOD AS NEEDED FOR PAIN.) 90 tablet 1   lisinopril  (ZESTRIL ) 30 MG tablet TAKE 1 TABLET BY MOUTH EVERY DAY 90 tablet 0   Multiple Vitamin (MULTIVITAMIN) tablet Take 1 tablet by mouth daily.     rosuvastatin  (CRESTOR ) 10 MG tablet TAKE 1 TABLET BY MOUTH EVERY  DAY 90 tablet 0   Current Facility-Administered Medications  Medication Dose Route Frequency Provider Last Rate Last Admin   0.9 %  sodium chloride  infusion  500 mL Intravenous Once Danahi Reddish, Elspeth SQUIBB, MD        Allergies as of 04/02/2024 - Review Complete 04/02/2024  Allergen Reaction Noted   Augmentin [amoxicillin-pot clavulanate] Diarrhea 09/22/2014   Prednisone  Other (See Comments) 10/11/2015    Family History  Problem Relation Age of Onset   Diverticulitis Mother    Heart attack Father 54   Heart disease Father        MI age 84   Ovarian cancer Sister    Heart disease Other    Colon cancer Neg Hx    Esophageal cancer Neg Hx    Rectal cancer Neg Hx    Stomach cancer Neg Hx     Social History   Socioeconomic History   Marital status: Married    Spouse name: Not on file   Number of children: 3   Years of education: Not on file   Highest education level: Some college, no degree  Occupational History   Occupation: Airline pilot  Tobacco Use   Smoking status: Never   Smokeless tobacco: Never  Vaping Use   Vaping status: Never Used  Substance and Sexual Activity   Alcohol use: Yes    Alcohol/week: 10.0 standard drinks of alcohol    Types: 10 Cans of beer per week   Drug use: No   Sexual activity: Yes    Partners: Female  Other Topics Concern   Not on file  Social History Narrative    Married, 3 grown children.   Works in Counselling psychologist Financial trader).   Lived in [place] area all his life.   No T/A/Ds.   Walks 3-4 times per day.   Social Drivers of Corporate investment banker Strain: Low Risk  (03/24/2024)   Overall Financial Resource Strain (CARDIA)    Difficulty of Paying Living Expenses: Not hard at all  Food Insecurity: No Food Insecurity (03/24/2024)   Hunger Vital Sign    Worried About Running Out of Food in the Last Year: Never true    Ran Out of Food in the Last Year: Never true  Transportation Needs: No Transportation Needs (03/24/2024)   PRAPARE - Administrator, Civil Service (Medical): No    Lack of Transportation (Non-Medical): No  Physical Activity: Sufficiently Active (03/24/2024)   Exercise Vital Sign    Days of Exercise per Week: 6 days    Minutes of Exercise per Session: 30 min  Stress: No Stress Concern Present (03/24/2024)   [employer] of Occupational Health - Occupational Stress Questionnaire    Feeling of Stress: Not at all  Social Connections: Socially Integrated (03/24/2024)   Social Connection and Isolation Panel    Frequency of Communication with Friends and Family: More than three times a week    Frequency of Social Gatherings with Friends and Family: Three times a week    Attends Religious Services: 1 to 4 times per year    Active Member of Clubs or Organizations: Yes    Attends Banker Meetings: 1 to 4 times per year    Marital Status: Married  Catering manager Violence: Not At Risk (03/07/2023)   Humiliation, Afraid, Rape, and Kick questionnaire    Fear of Current or Ex-Partner: No    Emotionally Abused: No    Physically Abused: No    Sexually Abused: No  Review of Systems: All other review of systems negative except as mentioned in the HPI.  Physical Exam: Vital signs BP (!) 141/79   Pulse 86   Temp 97.9 F (36.6 C)   Ht 5' 10 (1.778 m)   Wt 174 lb (78.9 kg)   SpO2 97%   BMI 24.97 kg/m    General:   Alert,  Well-developed, pleasant and cooperative in NAD Lungs:  Clear throughout to auscultation.   Heart:  Regular rate and rhythm Abdomen:  Soft, nontender and nondistended.   Neuro/Psych:  Alert and cooperative. Normal mood and affect. A and O x 3  Marcey Naval, MD [facility]

## 2024-04-02 NOTE — Op Note (Signed)
  [facility] Patient Name: Keith Sanders Procedure Date: 04/02/2024 12:10 PM MRN: [id number] Endoscopist: Elspeth P. Leigh , MD, [id number] Age: 73 Referring MD:  Date of Birth: 12-Jun-1951 Gender: Male Account #: [id number] Procedure:                Colonoscopy Indications:              Positive Cologuard test - last exam 07/2013 - normal                            (Dr. Debrah). Asymptomatic Medicines:                Monitored Anesthesia Care Procedure:                Pre-Anesthesia Assessment:                           - Prior to the procedure, a History and Physical                            was performed, and patient medications and                            allergies were reviewed. The patient's tolerance of                            previous anesthesia was also reviewed. The risks                            and benefits of the procedure and the sedation                            options and risks were discussed with the patient.                            All questions were answered, and informed consent                            was obtained. Prior Anticoagulants: The patient has                            taken no anticoagulant or antiplatelet agents. ASA                            Grade Assessment: II - A patient with mild systemic                            disease. After reviewing the risks and benefits,                            the patient was deemed in satisfactory condition to                            undergo the procedure.  After obtaining informed consent, the colonoscope                            was passed under direct vision. Throughout the                            procedure, the patient's blood pressure, pulse, and                            oxygen saturations were monitored continuously. The                            Olympus Scope SN: [id number] was introduced through                            the anus and advanced to  the the terminal ileum,                            with identification of the appendiceal orifice and                            IC valve. The colonoscopy was performed without                            difficulty. The patient tolerated the procedure                            well. The quality of the bowel preparation was                            fair. The terminal ileum, ileocecal valve,                            appendiceal orifice, and rectum were photographed. Scope In: 12:22:05 PM Scope Out: 12:46:12 PM Scope Withdrawal Time: 0 hours 20 minutes 56 seconds  Total Procedure Duration: 0 hours 24 minutes 7 seconds  Findings:                 The perianal and digital rectal examinations were                            normal.                           The terminal ileum appeared normal.                           A large amount of liquid stool was found in the                            entire colon, making visualization difficult due to                            thick adherent stool along the colon wall. This was  difficult to lavage off, particularly in the right                            colon and proximal transverse colon. Lavage of the                            colon was performed using copious amounts fluid,                            resulting in incomplete clearance with fair                            visualization. I did not see any high risk polyps                            or large lesions, but in some areas flat or small                            polyps would be hard to appreciate.                           A 4 to 5 mm polyp was found in the sigmoid colon.                            The polyp was sessile. The polyp was removed with a                            cold snare. Resection and retrieval were complete.                           Internal hemorrhoids were found during retroflexion.                           The exam was otherwise  without abnormality. Complications:            No immediate complications. Estimated blood loss:                            Minimal. Estimated Blood Loss:     Estimated blood loss was minimal. Impression:               - Preparation of the colon was fair, significant                            adherent liquid stool that was difficult to wash                            off and clear as outlined. No high risk lesions                            noted however.                           - The examined  portion of the ileum was normal.                           - One 4 to 5 mm polyp in the sigmoid colon, removed                            with a cold snare. Resected and retrieved.                           - Internal hemorrhoids.                           - The examination was otherwise normal.                           Overall, no large or high risk lesions appreciated,                            however in some areas, flat or smaller polyps may                            have been hard to visualized given the preparation.                            Given the positive cologuard result, recommend a                            repeat exam at the patient's convenience. Could                            stay on clear liquids today and repeat tomorrow (if                            openings) or double prep at another time in the                            next few months, will discuss options with him. Recommendation:           - Patient has a contact number available for                            emergencies. The signs and symptoms of potential                            delayed complications were discussed with the                            patient. Return to normal activities tomorrow.                            Written discharge instructions were provided to the                            patient.                           -  Resume previous diet.                           - Continue present  medications.                           - Await pathology results.                           - Repeat colonoscopy because the bowel preparation                            was suboptimal as above. Elspeth P. Adonia Porada, MD 04/02/2024 12:53:58 PM This report has been signed electronically.

## 2024-04-02 NOTE — Patient Instructions (Addendum)
 Resume previous diet. Continue present medications. Awaiting pathology results. Scheduled repeat colonoscopy and pre visit appointments.  Handouts provided on polyps and hemorrhoids.  YOU HAD AN ENDOSCOPIC PROCEDURE TODAY AT THE [facility]:   Refer to the procedure report that was given to you for any specific questions about what was found during the examination.  If the procedure report does not answer your questions, please call your gastroenterologist to clarify.  If you requested that your care partner not be given the details of your procedure findings, then the procedure report has been included in a sealed envelope for you to review at your convenience later.  YOU SHOULD EXPECT: Some feelings of bloating in the abdomen. Passage of more gas than usual.  Walking can help get rid of the air that was put into your GI tract during the procedure and reduce the bloating. If you had a lower endoscopy (such as a colonoscopy or flexible sigmoidoscopy) you may notice spotting of blood in your stool or on the toilet paper. If you underwent a bowel prep for your procedure, you may not have a normal bowel movement for a few days.  Please Note:  You might notice some irritation and congestion in your nose or some drainage.  This is from the oxygen used during your procedure.  There is no need for concern and it should clear up in a day or so.  SYMPTOMS TO REPORT IMMEDIATELY:  Following lower endoscopy (colonoscopy or flexible sigmoidoscopy):  Excessive amounts of blood in the stool  Significant tenderness or worsening of abdominal pains  Swelling of the abdomen that is new, acute  Fever of 100F or higher  For urgent or emergent issues, a gastroenterologist can be reached at any hour by calling [phone]. Do not use MyChart messaging for urgent concerns.    DIET:  We do recommend a small meal at first, but then you may proceed to your regular diet.  Drink plenty of fluids but you  should avoid alcoholic beverages for 24 hours.  ACTIVITY:  You should plan to take it easy for the rest of today and you should NOT DRIVE or use heavy machinery until tomorrow (because of the sedation medicines used during the test).    FOLLOW UP: Our staff will call the number listed on your records the next business day following your procedure.  We will call around 7:15- 8:00 am to check on you and address any questions or concerns that you may have regarding the information given to you following your procedure. If we do not reach you, we will leave a message.     If any biopsies were taken you will be contacted by phone or by letter within the next 1-3 weeks.  Please call us  at [phone] if you have not heard about the biopsies in 3 weeks.    SIGNATURES/CONFIDENTIALITY: You and/or your care partner have signed paperwork which will be entered into your electronic medical record.  These signatures attest to the fact that that the information above on your After Visit Summary has been reviewed and is understood.  Full responsibility of the confidentiality of this discharge information lies with you and/or your care-partner.

## 2024-04-02 NOTE — Progress Notes (Signed)
 Called to room to assist during endoscopic procedure.  Patient ID and intended procedure confirmed with present staff. Received instructions for my participation in the procedure from the performing physician.

## 2024-04-02 NOTE — Progress Notes (Signed)
 Sedate, gd SR, tolerated procedure well, VSS, report to RN

## 2024-04-02 NOTE — Progress Notes (Signed)
 Pt's states no medical or surgical changes since previsit or office visit.

## 2024-04-03 ENCOUNTER — Telehealth: Payer: Self-pay

## 2024-04-03 NOTE — Telephone Encounter (Signed)
 Left message

## 2024-04-04 LAB — SURGICAL PATHOLOGY

## 2024-04-06 ENCOUNTER — Ambulatory Visit: Payer: Self-pay | Admitting: Gastroenterology

## 2024-04-09 ENCOUNTER — Other Ambulatory Visit: Payer: Self-pay | Admitting: Family Medicine

## 2024-04-10 ENCOUNTER — Encounter: Payer: Self-pay | Admitting: Family Medicine

## 2024-04-10 NOTE — Telephone Encounter (Signed)
 Requesting: zolpidem  Contract: 11/07/23 UDS: 11/07/23 Last Visit: 11/07/23 Next Visit: 05/12/24 Last Refill: 11/07/23 (90,0)  Please Advise. Med pending

## 2024-04-25 ENCOUNTER — Other Ambulatory Visit: Payer: Self-pay | Admitting: Family Medicine

## 2024-04-28 ENCOUNTER — Other Ambulatory Visit: Payer: Self-pay

## 2024-04-28 MED ORDER — ALPRAZOLAM 0.5 MG PO TABS: ORAL_TABLET | ORAL | 5 refills | Status: AC

## 2024-04-28 NOTE — Telephone Encounter (Signed)
 Requesting: alprazolam  Contract: 11/07/23 UDS: 11/07/23 Last Visit: 11/07/23 Next Visit: 05/12/24 Last Refill: 11/07/23 (120,5)  Please Advise. Rx pending

## 2024-04-30 ENCOUNTER — Encounter

## 2024-05-08 ENCOUNTER — Other Ambulatory Visit: Payer: Self-pay | Admitting: Family Medicine

## 2024-05-09 NOTE — Patient Instructions (Signed)
 Health Maintenance, Male  Adopting a healthy lifestyle and getting preventive care are important in promoting health and wellness. Ask your health care provider about:  The right schedule for you to have regular tests and exams.  Things you can do on your own to prevent diseases and keep yourself healthy.  What should I know about diet, weight, and exercise?  Eat a healthy diet    Eat a diet that includes plenty of vegetables, fruits, low-fat dairy products, and lean protein.  Do not eat a lot of foods that are high in solid fats, added sugars, or sodium.  Maintain a healthy weight  Body mass index (BMI) is a measurement that can be used to identify possible weight problems. It estimates body fat based on height and weight. Your health care provider can help determine your BMI and help you achieve or maintain a healthy weight.  Get regular exercise  Get regular exercise. This is one of the most important things you can do for your health. Most adults should:  Exercise for at least 150 minutes each week. The exercise should increase your heart rate and make you sweat (moderate-intensity exercise).  Do strengthening exercises at least twice a week. This is in addition to the moderate-intensity exercise.  Spend less time sitting. Even light physical activity can be beneficial.  Watch cholesterol and blood lipids  Have your blood tested for lipids and cholesterol at 71 years of age, then have this test every 5 years.  You may need to have your cholesterol levels checked more often if:  Your lipid or cholesterol levels are high.  You are older than 71 years of age.  You are at high risk for heart disease.  What should I know about cancer screening?  Many types of cancers can be detected early and may often be prevented. Depending on your health history and family history, you may need to have cancer screening at various ages. This may include screening for:  Colorectal cancer.  Prostate cancer.  Skin cancer.  Lung  cancer.  What should I know about heart disease, diabetes, and high blood pressure?  Blood pressure and heart disease  High blood pressure causes heart disease and increases the risk of stroke. This is more likely to develop in people who have high blood pressure readings or are overweight.  Talk with your health care provider about your target blood pressure readings.  Have your blood pressure checked:  Every 3-5 years if you are 24-52 years of age.  Every year if you are 3 years old or older.  If you are between the ages of 60 and 72 and are a current or former smoker, ask your health care provider if you should have a one-time screening for abdominal aortic aneurysm (AAA).  Diabetes  Have regular diabetes screenings. This checks your fasting blood sugar level. Have the screening done:  Once every three years after age 66 if you are at a normal weight and have a low risk for diabetes.  More often and at a younger age if you are overweight or have a high risk for diabetes.  What should I know about preventing infection?  Hepatitis B  If you have a higher risk for hepatitis B, you should be screened for this virus. Talk with your health care provider to find out if you are at risk for hepatitis B infection.  Hepatitis C  Blood testing is recommended for:  Everyone born from [birth year] through 1963.  Anyone  with known risk factors for hepatitis C.  Sexually transmitted infections (STIs)  You should be screened each year for STIs, including gonorrhea and chlamydia, if:  You are sexually active and are younger than 71 years of age.  You are older than 71 years of age and your health care provider tells you that you are at risk for this type of infection.  Your sexual activity has changed since you were last screened, and you are at increased risk for chlamydia or gonorrhea. Ask your health care provider if you are at risk.  Ask your health care provider about whether you are at high risk for HIV. Your health care provider  may recommend a prescription medicine to help prevent HIV infection. If you choose to take medicine to prevent HIV, you should first get tested for HIV. You should then be tested every 3 months for as long as you are taking the medicine.  Follow these instructions at home:  Alcohol use  Do not drink alcohol if your health care provider tells you not to drink.  If you drink alcohol:  Limit how much you have to 0-2 drinks a day.  Know how much alcohol is in your drink. In the [place] one drink equals one 12 oz bottle of beer (355 mL), one 5 oz glass of wine (148 mL), or one 1 oz glass of hard liquor (44 mL).  Lifestyle  Do not use any products that contain nicotine or tobacco. These products include cigarettes, chewing tobacco, and vaping devices, such as e-cigarettes. If you need help quitting, ask your health care provider.  Do not use street drugs.  Do not share needles.  Ask your health care provider for help if you need support or information about quitting drugs.  General instructions  Schedule regular health, dental, and eye exams.  Stay current with your vaccines.  Tell your health care provider if:  You often feel depressed.  You have ever been abused or do not feel safe at home.  Summary  Adopting a healthy lifestyle and getting preventive care are important in promoting health and wellness.  Follow your health care provider's instructions about healthy diet, exercising, and getting tested or screened for diseases.  Follow your health care provider's instructions on monitoring your cholesterol and blood pressure.  This information is not intended to replace advice given to you by your health care provider. Make sure you discuss any questions you have with your health care provider.  Document Revised: 11/22/2020 Document Reviewed: 11/22/2020  Elsevier Patient Education  2024 [employer].

## 2024-05-12 ENCOUNTER — Encounter: Admitting: Family Medicine

## 2024-05-12 DIAGNOSIS — Z0000 Encounter for general adult medical examination without abnormal findings: Secondary | ICD-10-CM

## 2024-05-18 ENCOUNTER — Other Ambulatory Visit: Payer: Self-pay | Admitting: Family Medicine

## 2024-05-19 ENCOUNTER — Other Ambulatory Visit: Payer: Self-pay | Admitting: Family Medicine

## 2024-05-20 ENCOUNTER — Ambulatory Visit

## 2024-05-20 NOTE — Progress Notes (Unsigned)
No egg or soy allergy known to patient  No issues known to pt with past sedation with any surgeries or procedures Patient denies ever being told they had issues or difficulty with intubation  No FH of Malignant Hyperthermia Pt is not on diet pills Pt is not on  home 02  Pt is not on blood thinners  Pt denies issues with constipation  No A fib or A flutter Have any cardiac testing pending--*** Pt instructed to use Singlecare.com or GoodRx for a price reduction on prep   

## 2024-05-21 ENCOUNTER — Ambulatory Visit

## 2024-05-21 VITALS — Ht 70.0 in | Wt 178.0 lb

## 2024-05-21 DIAGNOSIS — K22719 Barrett's esophagus with dysplasia, unspecified: Secondary | ICD-10-CM

## 2024-05-21 DIAGNOSIS — Z860100 Personal history of colon polyps, unspecified: Secondary | ICD-10-CM

## 2024-05-21 MED ORDER — SUTAB 1479-225-188 MG PO TABS: 12.0000 | ORAL_TABLET | ORAL | 0 refills | Status: AC

## 2024-05-21 MED ORDER — NA SULFATE-K SULFATE-MG SULF 17.5-3.13-1.6 GM/177ML PO SOLN: 1.0000 | Freq: Once | ORAL | 0 refills | Status: DC

## 2024-05-21 NOTE — Progress Notes (Signed)
No egg or soy allergy known to patient  No issues known to pt with past sedation with any surgeries or procedures Patient denies ever being told they had issues or difficulty with intubation  No FH of Malignant Hyperthermia Pt is not on diet pills Pt is not on  home 02  Pt is not on blood thinners  Pt has issues with constipation  No A fib or A flutter Have any cardiac testing pending--no Pt can ambulate independently Pt denies use of chewing tobacco Discussed diabetic I weight loss medication holds Discussed NSAID holds Checked BMI Pt instructed to use Singlecare.com or GoodRx for a price reduction on prep  Patient's chart reviewed by Cathlyn Parsons CNRA prior to previsit and patient appropriate for the [facility].  Pre visit completed and red dot placed by patient's name on their procedure day (on provider's schedule).

## 2024-05-21 NOTE — Addendum Note (Signed)
 Addended by: GERMAN FRANNE BROCKS on: 05/21/2024 03:30 PM   Modules accepted: Orders

## 2024-05-22 ENCOUNTER — Other Ambulatory Visit: Payer: Self-pay | Admitting: Family Medicine

## 2024-05-22 ENCOUNTER — Ambulatory Visit: Admitting: Family Medicine

## 2024-05-22 ENCOUNTER — Encounter: Payer: Self-pay | Admitting: Family Medicine

## 2024-05-22 ENCOUNTER — Encounter

## 2024-05-22 VITALS — BP 138/76 | HR 73 | Temp 98.1°F | Ht 70.0 in | Wt 180.0 lb

## 2024-05-22 DIAGNOSIS — E7800 Pure hypercholesterolemia, unspecified: Secondary | ICD-10-CM | POA: Diagnosis not present

## 2024-05-22 DIAGNOSIS — I10 Essential (primary) hypertension: Secondary | ICD-10-CM

## 2024-05-22 DIAGNOSIS — Z79899 Other long term (current) drug therapy: Secondary | ICD-10-CM

## 2024-05-22 DIAGNOSIS — F411 Generalized anxiety disorder: Secondary | ICD-10-CM

## 2024-05-22 DIAGNOSIS — F5101 Primary insomnia: Secondary | ICD-10-CM

## 2024-05-22 LAB — COMPREHENSIVE METABOLIC PANEL WITH GFR
ALT: 20 U/L (ref 0–53)
AST: 19 U/L (ref 0–37)
Albumin: 4.4 g/dL (ref 3.5–5.2)
Alkaline Phosphatase: 75 U/L (ref 39–117)
BUN: 14 mg/dL (ref 6–23)
CO2: 29 meq/L (ref 19–32)
Calcium: 9.1 mg/dL (ref 8.4–10.5)
Chloride: 104 meq/L (ref 96–112)
Creatinine, Ser: 1.01 mg/dL (ref 0.40–1.50)
GFR: 74.98 mL/min (ref >60.00)
Glucose, Bld: 86 mg/dL (ref 70–99)
Potassium: 4.6 meq/L (ref 3.5–5.1)
Sodium: 141 meq/L (ref 135–145)
Total Bilirubin: 0.9 mg/dL (ref 0.2–1.2)
Total Protein: 6.3 g/dL (ref 6.0–8.3)

## 2024-05-22 LAB — LIPID PANEL
Cholesterol: 165 mg/dL (ref 0–200)
HDL: 62.5 mg/dL (ref >39.00)
LDL Cholesterol: 78 mg/dL (ref 0–99)
NonHDL: 102.91
Total CHOL/HDL Ratio: 3
Triglycerides: 125 mg/dL (ref 0.0–149.0)
VLDL: 25 mg/dL (ref 0.0–40.0)

## 2024-05-22 MED ORDER — LISINOPRIL 30 MG PO TABS
30.0000 mg | ORAL_TABLET | Freq: Every day | ORAL | 3 refills | Status: AC
Start: 2024-05-22 — End: ?

## 2024-05-22 MED ORDER — ROSUVASTATIN CALCIUM 10 MG PO TABS
10.0000 mg | ORAL_TABLET | Freq: Every day | ORAL | 3 refills | Status: AC
Start: 2024-05-22 — End: ?

## 2024-05-22 NOTE — Progress Notes (Signed)
 OFFICE VISIT  05/22/2024  CC:  Chief Complaint  Patient presents with   Medical Management of Chronic Issues    Pt is fasting    Patient is a 73 y.o. male who presents for 84-month follow-up insomnia, hypertension, hyperlipidemia, and anxiety. A/P as of last visit: 1 hypertension, well-controlled on lisinopril  30 mg a day. Electrolytes and creatinine today.   2  Hypercholesterolemia, doing well on rosuvastatin  10 mg a day. Lipid panel and hepatic panel today.   3 chronic anxiety, doing well on Xanax  0.5 mg 4 times daily long-term. Controlled substance contract and urine drug screen up-to-date.   #4 olecranon bursitis, right elbow.  Resolving. Signs/symptoms to call or return for were reviewed and pt expressed understanding.   #5 persistent right sinusitis with right sided ear symptom (hears his heartbeat).  Question some remaining serous otitis on the right. Will do 1 week of Omnicef .  He will continue Astelin  and daily antihistamine.   #5 insomnia.  Doing good with long-term use of Ambien  CR 12.5 mg nightly   #6 preventative health care:  Vaccines: Shingrix-->declined. otherwise up-to-date. Labs: BMet, lipids, PSA Prostate ca screening: PSA today Colon ca screening: recall 2025.  INTERIM HX: All labs were excellent last visit. Dale says he is feeling very well. He continues to work full-time and enjoys it. He takes care of his grandkids quite a bit as well.  Says he is getting a lot of walking and for exercise.    Anxiety well-controlled and sleep is good.  PMP AWARE reviewed today: most recent rx for alprazolam  0.5 was filled 04/28/2024, # 120, rx by me.  Most recent Ambien  12.5 prescription was filled 04/10/2024, #90, prescription by me. No red flags.  ROS --> no fevers, no CP, no SOB, no wheezing, no cough, no dizziness, no HAs, no rashes, no melena/hematochezia.  No polyuria or polydipsia.  No myalgias or arthralgias.  No focal weakness, paresthesias, or tremors.   No acute vision or hearing abnormalities.  No dysuria or unusual/new urinary urgency or frequency.  No recent changes in lower legs. No n/v/d or abd pain.  No palpitations.    Past Medical History:  Diagnosis Date   Anxiety    BPPV (benign paroxysmal positional vertigo) 06/2014   C. difficile colitis 2018   episode of c. diff caused by Augmentin medication   Calcific Achilles tendinitis of left lower extremity    Chronic insomnia    Chronic lumbar pain    DDD; ibuprofen , occasional muscle relaxers.  MRI L-spine 2007 showed some bulging discs with nerve impingement.  Has never had back injection or surgery.   Colon cancer screening    cologuard POS 02/2024   Contact dermatitis and eczema    COVID-19 virus infection 07/2020   Diverticulitis    Genital herpes 08/2015   HSV type I on culture   GERD (gastroesophageal reflux disease) 1995   Gilbert syndrome    Gout    always right big toe--about one episode every 4 yrs.  Right ankle flare, 02/2016.   History of adenomatous polyp of colon 2005   History of bronchitis 08/11/2012   History of Clostridium difficile colitis 2016   HTN (hypertension)    started lisinopril  08/20/19   Hyperlipidemia, mixed    Statin started 04/2018   Insomnia    PUD (peptic ulcer disease)    SCC (squamous cell carcinoma), lip    mohs surgery 2024    Past Surgical History:  Procedure Laterality Date  COLONOSCOPY  07/17/2013   Normal.  Recall 10 yrs.-RK-suprep(exc)   COLONOSCOPY W/ POLYPECTOMY  07/18/2003   Dr. Sheppard Finn   EYE SURGERY  2000   Lasik    Outpatient Medications Prior to Visit  Medication Sig Dispense Refill   ALPRAZolam  (XANAX ) 0.5 MG tablet TAKE 1 TABLET BY MOUTH FOUR TIMES A DAY AS NEEDED 120 tablet 5   Azelastine  HCl 137 MCG/SPRAY SOLN PLACE 1 SPRAY INTO BOTH NOSTRILS 2 (TWO) TIMES DAILY. USE IN EACH NOSTRIL AS DIRECTED 90 mL 0   Colchicine  0.6 MG CAPS 2 CAPS BY MOUTH AT ONSET OF GOUT FLARE, THEN TAKE ANOTHER 1 CAP 1 HOUR LATER 30  capsule 2   fluticasone  (CUTIVATE ) 0.05 % cream Apply topically 2 (two) times daily. 30 g 2   ibuprofen  (ADVIL ) 800 MG tablet TAKE 1 TABLET BY MOUTH THREE TIMES DAILY WITH FOOD AS NEEDED FOR PAIN 90 tablet 1   ketoconazole  (NIZORAL ) 2 % shampoo Apply to scalp every other day as needed for scalp seborrheic dermatitis (Patient taking differently: as needed. Apply to scalp every other day as needed for scalp seborrheic dermatitis) 120 mL 11   Multiple Vitamin (MULTIVITAMIN) tablet Take 1 tablet by mouth daily.     Na Sulfate-K Sulfate-Mg Sulfate concentrate (SUPREP) 17.5-3.13-1.6 GM/177ML SOLN      pantoprazole  (PROTONIX ) 40 MG tablet TAKE 1 TABLET(40 MG) BY MOUTH EVERY MORNING 90 tablet 0   Sodium Sulfate-Mag Sulfate-KCl (SUTAB) 1479-225-188 MG TABS Take 12 tablets by mouth as directed. 24 tablet 0   zolpidem  (AMBIEN  CR) 12.5 MG CR tablet TAKE 1 TABLET BY MOUTH EVERY DAY AT BEDTIME AS NEEDED 90 tablet 1   lisinopril  (ZESTRIL ) 30 MG tablet TAKE 1 TABLET BY MOUTH EVERY DAY 30 tablet 0   rosuvastatin  (CRESTOR ) 10 MG tablet TAKE 1 TABLET BY MOUTH EVERY DAY 30 tablet 0   No facility-administered medications prior to visit.    Allergies  Allergen Reactions   Augmentin [Amoxicillin-Pot Clavulanate] Diarrhea    Upset stomach-caused episode of C.diff   Prednisone  Other (See Comments)    Insomnia, elevated BP and HR    Review of Systems As per HPI  PE:    05/22/2024   10:33 AM 05/22/2024   10:29 AM 05/21/2024   11:03 AM  Vitals with BMI  Height  5' 10 5' 10  Weight  180 lbs 178 lbs  BMI  25.83 25.54  Systolic 138 136   Diastolic 76 85   Pulse  73      Physical Exam  Gen: Alert, well appearing.  Patient is oriented to person, place, time, and situation. AFFECT: pleasant, lucid thought and speech. CV: RRR, no m/r/g.   LUNGS: CTA bilat, nonlabored resps, good aeration in all lung fields. EXT: no clubbing or cyanosis.  no edema.    LABS:  Last CBC Lab Results  Component Value  Date   WBC 9.5 11/07/2022   HGB 14.7 11/07/2022   HCT 41.4 11/07/2022   MCV 95.4 11/07/2022   MCH 33.1 06/17/2014   RDW 13.0 11/07/2022   PLT 225.0 11/07/2022   Last metabolic panel Lab Results  Component Value Date   GLUCOSE 98 11/07/2023   NA 139 11/07/2023   K 4.1 11/07/2023   CL 106 11/07/2023   CO2 27 11/07/2023   BUN 13 11/07/2023   CREATININE 0.93 11/07/2023   GFR 83.10 11/07/2023   CALCIUM  9.2 11/07/2023   PHOS 2.8 02/27/2012   PROT 6.5 05/09/2023   ALBUMIN 4.4  05/09/2023   BILITOT 1.3 (H) 05/09/2023   ALKPHOS 72 05/09/2023   AST 24 05/09/2023   ALT 23 05/09/2023   ANIONGAP 13 06/17/2014   Last lipids Lab Results  Component Value Date   CHOL 151 11/07/2023   HDL 48.50 11/07/2023   LDLCALC 71 11/07/2023   LDLDIRECT 94.0 08/07/2022   TRIG 158.0 (H) 11/07/2023   CHOLHDL 3 11/07/2023   Last thyroid  functions Lab Results  Component Value Date   TSH 3.74 04/22/2018   IMPRESSION AND PLAN:  1 hypertension, well-controlled on lisinopril  30 mg a day. Electrolytes and creatinine today.   2  Hypercholesterolemia, doing well on rosuvastatin  10 mg a day. Lipid panel and hepatic panel today.   3 chronic anxiety, doing well on Xanax  0.5 mg 4 times daily long-term. Controlled substance contract and urine drug screen up-to-date. A new prescription was not needed today.   4 Insomnia.  Doing good with long-term use of Ambien  CR 12.5 mg nightly A new prescription was not needed today.  5 preventative health care:  Vaccines: Shingrix-->declined. otherwise up-to-date. Labs: CMet, lipids Prostate ca screening: next PSA 10/2024 Colon ca screening: Positive Cologuard this year.  1 adenomatous polyp on colonoscopy in September this year.  Incomplete bowel prep.  He is set to get another colonoscopy later this month.  An After Visit Summary was printed and given to the patient.  FOLLOW UP: Return in about 6 months (around 11/19/2024) for routine chronic illness  f/u.  Signed:  Gerlene Hockey, MD           05/22/2024

## 2024-05-22 NOTE — Telephone Encounter (Signed)
Pt has scheduled appt today

## 2024-05-23 ENCOUNTER — Ambulatory Visit: Payer: Self-pay | Admitting: Family Medicine

## 2024-05-30 ENCOUNTER — Telehealth: Payer: Self-pay | Admitting: Gastroenterology

## 2024-05-30 NOTE — Telephone Encounter (Signed)
 Patient called requesting a call to discuss prep concerns he has. Please advise, thank you

## 2024-05-30 NOTE — Telephone Encounter (Signed)
 Spoke with pt. Received both suprep and sutab from the pharmacy. Pt advise to follow prep instructions for sutab. He does not need to take suprep in addition.

## 2024-06-01 ENCOUNTER — Other Ambulatory Visit: Payer: Self-pay | Admitting: Family Medicine

## 2024-06-03 ENCOUNTER — Encounter: Payer: Self-pay | Admitting: Gastroenterology

## 2024-06-03 ENCOUNTER — Ambulatory Visit: Admitting: Gastroenterology

## 2024-06-03 VITALS — BP 106/64 | HR 71 | Temp 98.3°F | Resp 15 | Ht 70.0 in | Wt 178.0 lb

## 2024-06-03 DIAGNOSIS — Z1211 Encounter for screening for malignant neoplasm of colon: Secondary | ICD-10-CM

## 2024-06-03 DIAGNOSIS — K635 Polyp of colon: Secondary | ICD-10-CM | POA: Diagnosis not present

## 2024-06-03 DIAGNOSIS — D123 Benign neoplasm of transverse colon: Secondary | ICD-10-CM

## 2024-06-03 DIAGNOSIS — I10 Essential (primary) hypertension: Secondary | ICD-10-CM | POA: Diagnosis not present

## 2024-06-03 DIAGNOSIS — R195 Other fecal abnormalities: Secondary | ICD-10-CM

## 2024-06-03 DIAGNOSIS — K648 Other hemorrhoids: Secondary | ICD-10-CM | POA: Diagnosis not present

## 2024-06-03 DIAGNOSIS — F419 Anxiety disorder, unspecified: Secondary | ICD-10-CM | POA: Diagnosis not present

## 2024-06-03 MED ORDER — SODIUM CHLORIDE 0.9 % IV SOLN: 500.0000 mL | INTRAVENOUS | Status: AC

## 2024-06-03 NOTE — Progress Notes (Signed)
 I have reviewed the patient's medical history in detail and updated the computerized patient record.

## 2024-06-03 NOTE — Progress Notes (Signed)
 A/o x 3, VSS, good SR's, pleased with anesthesia, report to RN

## 2024-06-03 NOTE — Patient Instructions (Signed)

## 2024-06-03 NOTE — Progress Notes (Signed)
 Called to room to assist during endoscopic procedure.  Patient ID and intended procedure confirmed with present staff. Received instructions for my participation in the procedure from the performing physician.

## 2024-06-03 NOTE — Progress Notes (Signed)
 [facility] History and Physical   Primary Care Physician:  Candise Aleene DEL, MD   Reason for Procedure:   Positive cologuard  Plan:    colonoscopy     HPI: Keith Sanders is a 73 y.o. male  here for colonoscopy - he has had a positive cologuard. Colonoscopy 03/2024 revealed one small adenoma but prep was inadequate. Here for repeat exam using double prep.     Patient denies any bowel symptoms at this time. No family history of colon cancer known. Otherwise feels well without any cardiopulmonary symptoms.   I have discussed risks / benefits of anesthesia and endoscopic procedure with Oneil DELENA Moats and they wish to proceed with the exams as outlined today.   The patient was provided an opportunity to ask questions and all were answered. The patient agreed with the plan.    Past Medical History:  Diagnosis Date   Anxiety    BPPV (benign paroxysmal positional vertigo) 06/2014   C. difficile colitis 2018   episode of c. diff caused by Augmentin medication   Calcific Achilles tendinitis of left lower extremity    Chronic insomnia    Chronic lumbar pain    DDD; ibuprofen , occasional muscle relaxers.  MRI L-spine 2007 showed some bulging discs with nerve impingement.  Has never had back injection or surgery.   Colon cancer screening    cologuard POS 02/2024   Contact dermatitis and eczema    COVID-19 virus infection 07/2020   Diverticulitis    Genital herpes 08/2015   HSV type I on culture   GERD (gastroesophageal reflux disease) 1995   Gilbert syndrome    Gout    always right big toe--about one episode every 4 yrs.  Right ankle flare, 02/2016.   History of adenomatous polyp of colon 2005   History of bronchitis 08/11/2012   History of Clostridium difficile colitis 2016   HTN (hypertension)    started lisinopril  08/20/19   Hyperlipidemia, mixed    Statin started 04/2018   Insomnia    PUD (peptic ulcer disease)    SCC (squamous cell carcinoma), lip    mohs  surgery 2024    Past Surgical History:  Procedure Laterality Date   COLONOSCOPY  07/17/2013   Normal.  Recall 10 yrs.-RK-suprep(exc)   COLONOSCOPY W/ POLYPECTOMY  07/18/2003   Dr. Sheppard Finn   EYE SURGERY  2000   Lasik    Prior to Admission medications   Medication Sig Start Date End Date Taking? Authorizing Provider  ALPRAZolam  (XANAX ) 0.5 MG tablet TAKE 1 TABLET BY MOUTH FOUR TIMES A DAY AS NEEDED 04/28/24  Yes McGowen, Aleene DEL, MD  Azelastine  HCl 137 MCG/SPRAY SOLN PLACE 1 SPRAY INTO BOTH NOSTRILS 2 (TWO) TIMES DAILY. USE IN EACH NOSTRIL AS DIRECTED 06/02/24  Yes McGowen, Aleene DEL, MD  lisinopril  (ZESTRIL ) 30 MG tablet Take 1 tablet (30 mg total) by mouth daily. 05/22/24  Yes McGowen, Aleene DEL, MD  pantoprazole  (PROTONIX ) 40 MG tablet TAKE 1 TABLET(40 MG) BY MOUTH EVERY MORNING 05/09/24  Yes McGowen, Aleene DEL, MD  rosuvastatin  (CRESTOR ) 10 MG tablet Take 1 tablet (10 mg total) by mouth daily. 05/22/24  Yes McGowen, Aleene DEL, MD  Sodium Sulfate-Mag Sulfate-KCl (SUTAB) [phone] MG TABS Take 12 tablets by mouth as directed. 05/21/24  Yes Cirigliano, Vito V, DO  zolpidem  (AMBIEN  CR) 12.5 MG CR tablet TAKE 1 TABLET BY MOUTH EVERY DAY AT BEDTIME AS NEEDED 04/10/24  Yes McGowen, Aleene DEL, MD  Colchicine  0.6  MG CAPS 2 CAPS BY MOUTH AT ONSET OF GOUT FLARE, THEN TAKE ANOTHER 1 CAP 1 HOUR LATER 12/21/23   McGowen, Aleene DEL, MD  fluticasone  (CUTIVATE ) 0.05 % cream Apply topically 2 (two) times daily. 05/09/23   McGowen, Aleene DEL, MD  ibuprofen  (ADVIL ) 800 MG tablet TAKE 1 TABLET BY MOUTH THREE TIMES DAILY WITH FOOD AS NEEDED FOR PAIN 03/18/24   McGowen, Aleene DEL, MD  ketoconazole  (NIZORAL ) 2 % shampoo Apply to scalp every other day as needed for scalp seborrheic dermatitis Patient taking differently: as needed. Apply to scalp every other day as needed for scalp seborrheic dermatitis 05/09/23   McGowen, Aleene DEL, MD  Multiple Vitamin (MULTIVITAMIN) tablet Take 1 tablet by mouth daily.    [provider]    Current Outpatient Medications  Medication Sig Dispense Refill   ALPRAZolam  (XANAX ) 0.5 MG tablet TAKE 1 TABLET BY MOUTH FOUR TIMES A DAY AS NEEDED 120 tablet 5   Azelastine  HCl 137 MCG/SPRAY SOLN PLACE 1 SPRAY INTO BOTH NOSTRILS 2 (TWO) TIMES DAILY. USE IN EACH NOSTRIL AS DIRECTED 30 mL 2   lisinopril  (ZESTRIL ) 30 MG tablet Take 1 tablet (30 mg total) by mouth daily. 90 tablet 3   pantoprazole  (PROTONIX ) 40 MG tablet TAKE 1 TABLET(40 MG) BY MOUTH EVERY MORNING 90 tablet 0   rosuvastatin  (CRESTOR ) 10 MG tablet Take 1 tablet (10 mg total) by mouth daily. 90 tablet 3   Sodium Sulfate-Mag Sulfate-KCl (SUTAB) [phone] MG TABS Take 12 tablets by mouth as directed. 24 tablet 0   zolpidem  (AMBIEN  CR) 12.5 MG CR tablet TAKE 1 TABLET BY MOUTH EVERY DAY AT BEDTIME AS NEEDED 90 tablet 1   Colchicine  0.6 MG CAPS 2 CAPS BY MOUTH AT ONSET OF GOUT FLARE, THEN TAKE ANOTHER 1 CAP 1 HOUR LATER 30 capsule 2   fluticasone  (CUTIVATE ) 0.05 % cream Apply topically 2 (two) times daily. 30 g 2   ibuprofen  (ADVIL ) 800 MG tablet TAKE 1 TABLET BY MOUTH THREE TIMES DAILY WITH FOOD AS NEEDED FOR PAIN 90 tablet 1   ketoconazole  (NIZORAL ) 2 % shampoo Apply to scalp every other day as needed for scalp seborrheic dermatitis (Patient taking differently: as needed. Apply to scalp every other day as needed for scalp seborrheic dermatitis) 120 mL 11   Multiple Vitamin (MULTIVITAMIN) tablet Take 1 tablet by mouth daily.     Current Facility-Administered Medications  Medication Dose Route Frequency Provider Last Rate Last Admin   0.9 %  sodium chloride  infusion  500 mL Intravenous Continuous Daylah Sayavong, Elspeth SQUIBB, MD        Allergies as of 06/03/2024 - Review Complete 06/03/2024  Allergen Reaction Noted   Augmentin [amoxicillin-pot clavulanate] Diarrhea 09/22/2014   Prednisone  Other (See Comments) 10/11/2015    Family History  Problem Relation Age of Onset   Diverticulitis Mother    Heart attack Father  41   Heart disease Father        MI age 75   Ovarian cancer Sister    ADD / ADHD Son    Heart disease Other    Colon cancer Neg Hx    Esophageal cancer Neg Hx    Rectal cancer Neg Hx    Stomach cancer Neg Hx    Colon polyps Neg Hx     Social History   Socioeconomic History   Marital status: Married    Spouse name: Not on file   Number of children: 3   Years of education: Not on file  Highest education level: Some college, no degree  Occupational History   Occupation: sales  Tobacco Use   Smoking status: Never   Smokeless tobacco: Never  Vaping Use   Vaping status: Never Used  Substance and Sexual Activity   Alcohol use: Yes    Alcohol/week: 10.0 standard drinks of alcohol    Types: 10 Cans of beer per week   Drug use: No   Sexual activity: Yes    Partners: Female  Other Topics Concern   Not on file  Social History Narrative   Married, 3 grown children.   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   Lived in [place] area all his life.   No T/A/Ds.   Walks 3-4 times per day.   Social Drivers of Corporate Investment Banker Strain: Low Risk  (04/27/2024)   Overall Financial Resource Strain (CARDIA)    Difficulty of Paying Living Expenses: Not hard at all  Food Insecurity: No Food Insecurity (04/27/2024)   Hunger Vital Sign    Worried About Running Out of Food in the Last Year: Never true    Ran Out of Food in the Last Year: Never true  Transportation Needs: No Transportation Needs (04/27/2024)   PRAPARE - Administrator, Civil Service (Medical): No    Lack of Transportation (Non-Medical): No  Physical Activity: Sufficiently Active (04/27/2024)   Exercise Vital Sign    Days of Exercise per Week: 6 days    Minutes of Exercise per Session: 30 min  Stress: No Stress Concern Present (04/27/2024)   [employer] of Occupational Health - Occupational Stress Questionnaire    Feeling of Stress: Not at all  Social Connections: Socially Integrated (04/27/2024)    Social Connection and Isolation Panel    Frequency of Communication with Friends and Family: More than three times a week    Frequency of Social Gatherings with Friends and Family: Three times a week    Attends Religious Services: 1 to 4 times per year    Active Member of Clubs or Organizations: Yes    Attends Banker Meetings: 1 to 4 times per year    Marital Status: Married  Catering Manager Violence: Not At Risk (03/07/2023)   Humiliation, Afraid, Rape, and Kick questionnaire    Fear of Current or Ex-Partner: No    Emotionally Abused: No    Physically Abused: No    Sexually Abused: No    Review of Systems: All other review of systems negative except as mentioned in the HPI.  Physical Exam: Vital signs BP 130/79   Pulse 92   Temp 98.3 F (36.8 C)   Ht 5' 10 (1.778 m)   Wt 178 lb (80.7 kg)   SpO2 98%   BMI 25.54 kg/m   General:   Alert,  Well-developed, pleasant and cooperative in NAD Lungs:  Clear throughout to auscultation.   Heart:  Regular rate and rhythm Abdomen:  Soft, nontender and nondistended.   Neuro/Psych:  Alert and cooperative. Normal mood and affect. A and O x 3  Marcey Naval, MD [facility]

## 2024-06-03 NOTE — Op Note (Signed)
 [facility] Patient Name: Keith Sanders Procedure Date: 06/03/2024 9:13 AM MRN: [id number] Endoscopist: Elspeth P. Leigh , MD, [id number] Age: 73 Referring MD:  Date of Birth: 1950/09/03 Gender: Male Account #: [id number] Procedure:                Colonoscopy Indications:              Positive Cologuard test - exam done 03/2024 showed                            one small adenoma but prep was poor. Here for                            repeat exam with 2 day prep Medicines:                Monitored Anesthesia Care Procedure:                Pre-Anesthesia Assessment:                           - Prior to the procedure, a History and Physical                            was performed, and patient medications and                            allergies were reviewed. The patient's tolerance of                            previous anesthesia was also reviewed. The risks                            and benefits of the procedure and the sedation                            options and risks were discussed with the patient.                            All questions were answered, and informed consent                            was obtained. Prior Anticoagulants: The patient has                            taken no anticoagulant or antiplatelet agents. ASA                            Grade Assessment: II - A patient with mild systemic                            disease. After reviewing the risks and benefits,                            the patient was deemed in satisfactory condition to  undergo the procedure.                           After obtaining informed consent, the colonoscope                            was passed under direct vision. Throughout the                            procedure, the patient's blood pressure, pulse, and                            oxygen saturations were monitored continuously. The                            CF [id number] #[id number] was  introduced through the anus                            and advanced to the the cecum, identified by                            appendiceal orifice and ileocecal valve. The                            colonoscopy was performed without difficulty. The                            patient tolerated the procedure well. The quality                            of the bowel preparation was good. The ileocecal                            valve, appendiceal orifice, and rectum were                            photographed. Scope In: 9:26:53 AM Scope Out: 9:42:10 AM Scope Withdrawal Time: 0 hours 12 minutes 34 seconds  Total Procedure Duration: 0 hours 15 minutes 17 seconds  Findings:                 The perianal and digital rectal examinations were                            normal.                           A diminutive polyp was found in the transverse                            colon. The polyp was sessile. The polyp was removed                            with a cold snare. Resection and retrieval were  complete.                           Internal hemorrhoids were found during retroflexion.                           The exam was otherwise without abnormality. Some                            residual stool was noted leading to lavage but                            adequate views were able to be obtained. Complications:            No immediate complications. Estimated blood loss:                            Minimal. Estimated Blood Loss:     Estimated blood loss was minimal. Impression:               - One diminutive polyp in the transverse colon,                            removed with a cold snare. Resected and retrieved.                           - Internal hemorrhoids.                           - The examination was otherwise normal. Recommendation:           - Patient has a contact number available for                            emergencies. The signs and symptoms of  potential                            delayed complications were discussed with the                            patient. Return to normal activities tomorrow.                            Written discharge instructions were provided to the                            patient.                           - Resume previous diet.                           - Continue present medications.                           - Await pathology results. Anticipate repeat  colonoscopy in 7 years given one small adenoma on                            the last exam and diminutive polyp on this exam, if                            the patient wishes to continue surveillance at that                            time (would next likely be due age 74) Elspeth SQUIBB. Egbert Seidel, MD 06/03/2024 9:47:36 AM This report has been signed electronically.

## 2024-06-04 ENCOUNTER — Telehealth: Payer: Self-pay

## 2024-06-04 NOTE — Telephone Encounter (Signed)
 No answer after follow up call. Left VM.

## 2024-06-06 LAB — SURGICAL PATHOLOGY

## 2024-06-07 ENCOUNTER — Ambulatory Visit: Payer: Self-pay | Admitting: Gastroenterology

## 2024-06-23 ENCOUNTER — Other Ambulatory Visit: Payer: Self-pay | Admitting: Family Medicine

## 2024-08-09 ENCOUNTER — Other Ambulatory Visit: Payer: Self-pay | Admitting: Family Medicine

## 2024-08-15 NOTE — Progress Notes (Signed)
 Appt cancelled

## 2024-08-21 ENCOUNTER — Other Ambulatory Visit: Payer: Self-pay | Admitting: Family Medicine

## 2024-11-19 ENCOUNTER — Ambulatory Visit: Admitting: Family Medicine
